# Patient Record
Sex: Male | Born: 1960
Health system: Southern US, Community
[De-identification: ages and names within clinical notes are randomized; demographics above are authoritative.]

## PROBLEM LIST (undated history)

## (undated) ENCOUNTER — Emergency Department (HOSPITAL_COMMUNITY): Discharge status: Absent without leave | Payer: No Typology Code available for payment source

## (undated) DIAGNOSIS — H9190 Unspecified hearing loss, unspecified ear: Secondary | ICD-10-CM

## (undated) DIAGNOSIS — R5383 Other fatigue: Secondary | ICD-10-CM

## (undated) DIAGNOSIS — K0889 Other specified disorders of teeth and supporting structures: Secondary | ICD-10-CM

## (undated) DIAGNOSIS — K219 Gastro-esophageal reflux disease without esophagitis: Secondary | ICD-10-CM

## (undated) DIAGNOSIS — T7840XA Allergy, unspecified, initial encounter: Secondary | ICD-10-CM

## (undated) DIAGNOSIS — F0390 Unspecified dementia without behavioral disturbance: Secondary | ICD-10-CM

## (undated) DIAGNOSIS — H9319 Tinnitus, unspecified ear: Secondary | ICD-10-CM

## (undated) DIAGNOSIS — R1310 Dysphagia, unspecified: Secondary | ICD-10-CM

## (undated) DIAGNOSIS — Q676 Pectus excavatum: Secondary | ICD-10-CM

## (undated) DIAGNOSIS — R0602 Shortness of breath: Secondary | ICD-10-CM

## (undated) DIAGNOSIS — T884XXA Failed or difficult intubation, initial encounter: Secondary | ICD-10-CM

## (undated) DIAGNOSIS — M549 Dorsalgia, unspecified: Secondary | ICD-10-CM

## (undated) DIAGNOSIS — M4050 Lordosis, unspecified, site unspecified: Secondary | ICD-10-CM

## (undated) DIAGNOSIS — R413 Other amnesia: Secondary | ICD-10-CM

## (undated) DIAGNOSIS — M6281 Muscle weakness (generalized): Secondary | ICD-10-CM

## (undated) HISTORY — DX: Allergy, unspecified, initial encounter: T78.40XA

## (undated) HISTORY — PX: CERVICAL FUSION: SHX112

## (undated) HISTORY — DX: Dysphagia, unspecified: R13.10

## (undated) HISTORY — DX: Other specified disorders of teeth and supporting structures: K08.89

## (undated) HISTORY — DX: Other fatigue: R53.83

## (undated) HISTORY — DX: Tinnitus, unspecified ear: H93.19

## (undated) HISTORY — DX: Muscle weakness (generalized): M62.81

## (undated) HISTORY — PX: OTHER SURGICAL HISTORY: SHX169

## (undated) HISTORY — PX: BACK SURGERY: SHX140

## (undated) HISTORY — DX: Unspecified hearing loss, unspecified ear: H91.90

## (undated) HISTORY — DX: Shortness of breath: R06.02

## (undated) HISTORY — DX: Unspecified dementia, unspecified severity, without behavioral disturbance, psychotic disturbance, mood disturbance, and anxiety: F03.90

## (undated) HISTORY — DX: Other amnesia: R41.3

## (undated) HISTORY — DX: Dorsalgia, unspecified: M54.9

## (undated) HISTORY — DX: Lordosis, unspecified, site unspecified: M40.50

---
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
## 1984-11-23 HISTORY — PX: INGUINAL HERNIA REPAIR: SUR1180

## 1997-11-23 HISTORY — PX: ANTERIOR CERVICAL DISCECTOMY: SHX1160

## 2006-12-29 ENCOUNTER — Inpatient Hospital Stay (HOSPITAL_COMMUNITY): Admission: EM | Admit: 2006-12-29 | Discharge: 2006-12-30 | Payer: Self-pay | Admitting: Emergency Medicine

## 2008-11-23 HISTORY — PX: OTHER SURGICAL HISTORY: SHX169

## 2009-03-26 ENCOUNTER — Ambulatory Visit (HOSPITAL_COMMUNITY): Admission: RE | Admit: 2009-03-26 | Discharge: 2009-03-26 | Payer: Self-pay | Admitting: Family Medicine

## 2010-11-23 HISTORY — PX: TRANSURETHRAL RESECTION OF PROSTATE: SHX73

## 2010-12-23 ENCOUNTER — Ambulatory Visit (HOSPITAL_COMMUNITY)
Admission: RE | Admit: 2010-12-23 | Discharge: 2010-12-23 | Payer: Self-pay | Source: Home / Self Care | Attending: Family Medicine | Admitting: Family Medicine

## 2011-04-10 NOTE — H&P (Signed)
[name], [name]                    ACCOUNT NO.:  [id number]   MEDICAL RECORD NO.:  [id number]           PATIENT TYPE:  INP   LOCATION:  A336                          FACILITY:  [facility]   PHYSICIAN:  [name], M.D.DATE OF BIRTH:  1961/11/20   DATE OF ADMISSION:  DATE OF DISCHARGE:  LH                              HISTORY & PHYSICAL   CHIEF COMPLAINT:  Abdominal pain.   HISTORY OF PRESENT ILLNESS:  This is a 50 year old male who underwent  cervical disk surgery at [facility] recently. The patient was  discharged the day before admission and presents to the emergency room  unable to pass gas, abdominal distention, and pain. X-ray consistent  with a dynamic ileus/small bowel obstruction. The patient afebrile. Has  a white count 24,000. __________  . Electrolytes were normal. The  patient will be admitted, strict NPO, NG tubes, surgical consult.   ALLERGIES:  The patient is allergic to MORPHINE.   MEDICATIONS:  Apparently the patient has been taking __________  ,  Flexeril, oxycodone, and 4 mg Medrol Pak.   SOCIAL HISTORY:  The patient is a nonsmoker, nondrinker, nonalcohol  abuser. Works for the [employer].   PHYSICAL EXAMINATION:  VITAL SIGNS:  Patient is afebrile, blood pressure  150/90, O2 sats 97% on room air, pulse 90 and regular.  HEENT:  TMs normal, pupils are equal and react accommodation, oropharynx  benign.  NECK:  Supple without JVD, bruit or thyromegaly.  LUNGS:  Clear to auscultation.  ABDOMEN:  Pronounced distention, tympanitic, silent bowel sounds. Mild  diffuse tenderness.  EXTREMITIES:  Without clubbing, cyanosis or edema.  NEUROLOGICAL:  Grossly intact.   ASSESSMENT:  1. Small bowel obstruction.  2. Status post cervical disk surgery.      [name], M.D.  Electronically Signed     WMM/MEDQ  D:  12/30/2006  T:  12/30/2006  Job:  [id number]

## 2011-04-10 NOTE — Discharge Summary (Signed)
[name], [name]                    ACCOUNT NO.:  [id number]   MEDICAL RECORD NO.:  [id number]          PATIENT TYPE:  INP   LOCATION:  A336                          FACILITY:  [facility]   PHYSICIAN:  Dalia Heading, M.D.  DATE OF BIRTH:  12/18/1960   DATE OF ADMISSION:  12/29/2006  DATE OF DISCHARGE:  02/07/2008LH                               DISCHARGE SUMMARY   HOSPITAL COURSE SUMMARY:  The patient is a 50 year old white male who on  December 28, 2006 underwent a C4-C5 anterior cervical disk surgery by Dr.  Rise Mu of [facility] who presented to the emergency  room yesterday evening after discharge from [facility] with worsening  abdominal distention, nausea and vomiting.  He presented to the  emergency room at [facility] and was admitted by Dr. Regino Schultze,  family practice, for further evaluation and treatment.  Surgery  consultation was obtained.  The patient was noted to have significant  abdominal distention with occasional bowel sounds.  The patient states  he has not had a significant bowel movement in many days.  A CT scan of  the abdomen and pelvis was performed which revealed a high-grade small  bowel obstruction in the mid to distal aspect of the small bowel.  There  is small bowel wall thickening at the area of the stricture as well as  free fluid and collapsed bowel distally.  There is a strong indication  that this patient requires an exploratory laparotomy.  The etiology of  this stricture and bowel obstruction is unknown as the patient has not  had previous abdominal surgery.  In discussing this with Dr. Jayme Cloud of  the anesthesia department at [facility], he did not feel  comfortable performing general endotracheal intubation due to the  patient's very recent surgery.  He states he did not have the backup to  provide further anesthesia care should difficulties arise at the time of  intubation.  This was discussed with Maralyn Sago who works for Dr.  Rise Mu and  she has agreed, along with Dr. Rise Mu to accept the patient in transfer  back to [facility] for further evaluation and  treatment.  This has been discussed with the patient and family who  agreed to the transfer.  Dr. Karleen Hampshire has been notified and  agrees with the transfer also.   CONDITION ON TRANSFER:  Stable.   DISCHARGE MEDICATIONS:  Dilaudid and Zofran.   DISCHARGE DIAGNOSES:  1. Small bowel obstruction, unknown etiology.  2. Status post recent neck surgery.      Dalia Heading, M.D.  Electronically Signed    MAJ/MEDQ  D:  12/30/2006  T:  12/30/2006  Job:  [id number]   cc:   Kirk Ruths, M.D.  Fax: [phone]

## 2011-07-21 ENCOUNTER — Ambulatory Visit (AMBULATORY_SURGERY_CENTER): Payer: PRIVATE HEALTH INSURANCE | Admitting: *Deleted

## 2011-07-21 ENCOUNTER — Encounter: Payer: Self-pay | Admitting: Gastroenterology

## 2011-07-21 VITALS — Ht 69.5 in | Wt 187.8 lb

## 2011-07-21 DIAGNOSIS — Z1211 Encounter for screening for malignant neoplasm of colon: Secondary | ICD-10-CM

## 2011-07-21 MED ORDER — SUPREP BOWEL PREP KIT 17.5-3.13-1.6 GM/177ML PO SOLN: 1.0000 | Freq: Once | ORAL | Status: DC

## 2011-08-07 ENCOUNTER — Ambulatory Visit (AMBULATORY_SURGERY_CENTER): Payer: PRIVATE HEALTH INSURANCE | Admitting: Gastroenterology

## 2011-08-07 ENCOUNTER — Encounter: Payer: Self-pay | Admitting: Gastroenterology

## 2011-08-07 VITALS — BP 136/79 | HR 73 | Temp 97.0°F | Resp 20 | Ht 69.0 in | Wt 187.0 lb

## 2011-08-07 DIAGNOSIS — D126 Benign neoplasm of colon, unspecified: Secondary | ICD-10-CM

## 2011-08-07 DIAGNOSIS — Z8371 Family history of colonic polyps: Secondary | ICD-10-CM

## 2011-08-07 DIAGNOSIS — Z1211 Encounter for screening for malignant neoplasm of colon: Secondary | ICD-10-CM

## 2011-08-07 MED ORDER — SODIUM CHLORIDE 0.9 % IV SOLN: 500.0000 mL | INTRAVENOUS | Status: DC

## 2011-08-07 NOTE — Patient Instructions (Signed)
Follow your discharge instructions.  Continue your medications.  Await pathology results. 

## 2011-08-12 ENCOUNTER — Encounter: Payer: Self-pay | Admitting: Gastroenterology

## 2014-05-02 ENCOUNTER — Other Ambulatory Visit: Payer: Self-pay | Admitting: Orthopedic Surgery

## 2014-05-02 DIAGNOSIS — M25532 Pain in left wrist: Secondary | ICD-10-CM

## 2014-05-17 ENCOUNTER — Other Ambulatory Visit: Payer: PRIVATE HEALTH INSURANCE

## 2014-06-08 ENCOUNTER — Ambulatory Visit
Admission: RE | Admit: 2014-06-08 | Discharge: 2014-06-08 | Disposition: A | Discharge status: Home / Self Care | Payer: PRIVATE HEALTH INSURANCE | Source: Ambulatory Visit | Attending: Orthopedic Surgery | Admitting: Orthopedic Surgery

## 2014-06-08 ENCOUNTER — Encounter: Payer: Self-pay | Admitting: Gastroenterology

## 2014-06-08 DIAGNOSIS — M25532 Pain in left wrist: Secondary | ICD-10-CM

## 2014-06-26 ENCOUNTER — Encounter: Payer: Self-pay | Admitting: Gastroenterology

## 2014-08-17 ENCOUNTER — Ambulatory Visit (AMBULATORY_SURGERY_CENTER): Payer: Self-pay

## 2014-08-17 VITALS — Ht 69.75 in | Wt 194.6 lb

## 2014-08-17 DIAGNOSIS — Z860100 Personal history of colon polyps, unspecified: Secondary | ICD-10-CM

## 2014-08-17 MED ORDER — SUPREP BOWEL PREP KIT 17.5-3.13-1.6 GM/177ML PO SOLN: 1.0000 | Freq: Once | ORAL | Status: DC

## 2014-08-17 NOTE — Progress Notes (Signed)
No allergies to eggs or soy No home oxygen No past problems with anesthesia No diet/weight loss meds  Has email.  Emmi instructions given for colonoscopy. 

## 2014-08-23 HISTORY — PX: COLONOSCOPY: SHX174

## 2014-09-10 ENCOUNTER — Telehealth: Payer: Self-pay | Admitting: Gastroenterology

## 2014-09-10 DIAGNOSIS — Z86010 Personal history of colonic polyps: Secondary | ICD-10-CM

## 2014-09-10 MED ORDER — NA SULFATE-K SULFATE-MG SULF 17.5-3.13-1.6 GM/177ML PO SOLN: 1.0000 | Freq: Once | ORAL | Status: DC

## 2014-09-10 NOTE — Telephone Encounter (Signed)
Resent Suprep to patient's pharmacy today. Left message for patient to notify him of this.

## 2014-09-14 ENCOUNTER — Encounter: Payer: Self-pay | Admitting: Gastroenterology

## 2014-09-14 ENCOUNTER — Ambulatory Visit (AMBULATORY_SURGERY_CENTER): Payer: PRIVATE HEALTH INSURANCE | Admitting: Gastroenterology

## 2014-09-14 VITALS — BP 142/79 | HR 80 | Temp 97.1°F | Resp 24 | Ht 69.75 in | Wt 194.0 lb

## 2014-09-14 DIAGNOSIS — D120 Benign neoplasm of cecum: Secondary | ICD-10-CM

## 2014-09-14 DIAGNOSIS — Z86010 Personal history of colonic polyps: Secondary | ICD-10-CM

## 2014-09-14 DIAGNOSIS — D125 Benign neoplasm of sigmoid colon: Secondary | ICD-10-CM

## 2014-09-14 MED ORDER — SODIUM CHLORIDE 0.9 % IV SOLN: 500.0000 mL | INTRAVENOUS | Status: DC

## 2014-09-14 NOTE — Progress Notes (Signed)
Called to room to assist during endoscopic procedure.  Patient ID and intended procedure confirmed with present staff. Received instructions for my participation in the procedure from the performing physician.  

## 2014-09-14 NOTE — Patient Instructions (Signed)
YOU HAD AN ENDOSCOPIC PROCEDURE TODAY AT THE  ENDOSCOPY CENTER: Refer to the procedure report that was given to you for any specific questions about what was found during the examination.  If the procedure report does not answer your questions, please call your gastroenterologist to clarify.  If you requested that your care partner not be given the details of your procedure findings, then the procedure report has been included in a sealed envelope for you to review at your convenience later.  YOU SHOULD EXPECT: Some feelings of bloating in the abdomen. Passage of more gas than usual.  Walking can help get rid of the air that was put into your GI tract during the procedure and reduce the bloating. If you had a lower endoscopy (such as a colonoscopy or flexible sigmoidoscopy) you may notice spotting of blood in your stool or on the toilet paper. If you underwent a bowel prep for your procedure, then you may not have a normal bowel movement for a few days.  DIET: Your first meal following the procedure should be a light meal and then it is ok to progress to your normal diet.  A half-sandwich or bowl of soup is an example of a good first meal.  Heavy or fried foods are harder to digest and may make you feel nauseous or bloated.  Likewise meals heavy in dairy and vegetables can cause extra gas to form and this can also increase the bloating.  Drink plenty of fluids but you should avoid alcoholic beverages for 24 hours.  ACTIVITY: Your care partner should take you home directly after the procedure.  You should plan to take it easy, moving slowly for the rest of the day.  You can resume normal activity the day after the procedure however you should NOT DRIVE or use heavy machinery for 24 hours (because of the sedation medicines used during the test).    SYMPTOMS TO REPORT IMMEDIATELY: A gastroenterologist can be reached at any hour.  During normal business hours, 8:30 AM to 5:00 PM Monday through Friday,  call [phone].  After hours and on weekends, please call the GI answering service at [phone] who will take a message and have the physician on call contact you.   Following lower endoscopy (colonoscopy or flexible sigmoidoscopy):  Excessive amounts of blood in the stool  Significant tenderness or worsening of abdominal pains  Swelling of the abdomen that is new, acute  Fever of 100F or higher  FOLLOW UP: If any biopsies were taken you will be contacted by phone or by letter within the next 1-3 weeks.  Call your gastroenterologist if you have not heard about the biopsies in 3 weeks.  Our staff will call the home number listed on your records the next business day following your procedure to check on you and address any questions or concerns that you may have at that time regarding the information given to you following your procedure. This is a courtesy call and so if there is no answer at the home number and we have not heard from you through the emergency physician on call, we will assume that you have returned to your regular daily activities without incident.  SIGNATURES/CONFIDENTIALITY: You and/or your care partner have signed paperwork which will be entered into your electronic medical record.  These signatures attest to the fact that that the information above on your After Visit Summary has been reviewed and is understood.  Full responsibility of the confidentiality of this   discharge information lies with you and/or your care-partner.  Recommendations Next colonoscopy determined by pathology results, 3 years or 5 years. Advised patient/care partner no aspirin, aspirin products, or anti-inflammatory medications for two weeks. Polyp(s) and Diverticulosis handouts provided to patient/care partner.

## 2014-09-14 NOTE — Progress Notes (Signed)
Report to PACU, RN, vss, BBS= Clear.  

## 2014-09-14 NOTE — Op Note (Signed)
[facility]  [address] [place], [id number]   COLONOSCOPY PROCEDURE REPORT PATIENT: [name], [name]  MR#: [id number] BIRTHDATE: 1961/02/01 , 62  yrs. old GENDER: male ENDOSCOPIST: Ladene Artist, MD, [facility] PROCEDURE DATE:  09/14/2014 PROCEDURE:   Colonoscopy with biopsy and Colonoscopy with snare polypectomy First Screening Colonoscopy - Avg.  risk and is 50 yrs.  old or older - No.  Prior Negative Screening - Now for repeat screening. N/A  History of Adenoma - Now for follow-up colonoscopy & has been > or = to 3 yrs.  Yes hx of adenoma.  Has been 3 or more years since last colonoscopy.  Polyps Removed Today? Yes. ASA CLASS:   Class II INDICATIONS:surveillance colonoscopy based on a history of adenomatous colonic polyp(s). MEDICATIONS: Monitored anesthesia care and Propofol 300 mg IV DESCRIPTION OF PROCEDURE:   After the risks benefits and alternatives of the procedure were thoroughly explained, informed consent was obtained.  The digital rectal exam revealed no abnormalities of the rectum.   The [id number]  endoscope was introduced through the anus and advanced to the cecum, which was identified by both the appendix and ileocecal valve. No adverse events experienced.   The quality of the prep was good, using MoviPrep  The instrument was then slowly withdrawn as the colon was fully examined.  COLON FINDINGS: A sessile polyp measuring 8 mm in size was found at the cecum.  A polypectomy was performed using snare cautery.  The resection was complete, the polyp tissue was completely retrieved and sent to histology.   Two sessile polyps measuring 5 mm in size were found in the sigmoid colon.  A polypectomy was performed using snare cautery.  The resection was complete, the polyp tissue was completely retrieved and sent to histology.   Four sessile polyps measuring 4 mm in size were found in the sigmoid colon.  A polypectomy was performed with cold forceps.   The resection was complete, the polyp tissue was completely retrieved and sent to histology.   The examination was otherwise normal.  Retroflexed views revealed internal Grade I hemorrhoids. The time to cecum=2 minutes 17 seconds.  Withdrawal time=14 minutes 38 seconds.  The scope was withdrawn and the procedure completed. COMPLICATIONS: There were no immediate complications.  ENDOSCOPIC IMPRESSION: 1.   Sessile polyp at the cecum; polypectomy performed using snare cautery 2.   Two sessile polyps in the sigmoid colon; polypectomy performed using snare cautery 3.   Four sessile polyps in the sigmoid colon; polypectomy performed with cold forceps 4.   Grade I internal hemorrhoids  RECOMMENDATIONS: 1.  Await pathology results 2.  Hold Aspirin and all other NSAIDS for 2 weeks. 3.  Repeat colonoscopy in 3 years if 3 or more polyps are adenomatous; otherwise 5 years  eSigned:  Ladene Artist, MD, [facility] 09/14/2014 10:36 AM   cc: Elsie Lincoln, MD

## 2014-09-17 ENCOUNTER — Telehealth: Payer: Self-pay | Admitting: *Deleted

## 2014-09-17 NOTE — Telephone Encounter (Signed)
No answer. Number identifier. Message left to call if any questions or concerns. 

## 2014-09-18 ENCOUNTER — Encounter: Payer: Self-pay | Admitting: Gastroenterology

## 2014-09-18 ENCOUNTER — Other Ambulatory Visit: Payer: Self-pay | Admitting: Orthopedic Surgery

## 2014-10-03 ENCOUNTER — Encounter (HOSPITAL_BASED_OUTPATIENT_CLINIC_OR_DEPARTMENT_OTHER): Payer: Self-pay | Admitting: *Deleted

## 2014-10-08 ENCOUNTER — Encounter (HOSPITAL_BASED_OUTPATIENT_CLINIC_OR_DEPARTMENT_OTHER)
Admission: RE | Admit: 2014-10-08 | Discharge: 2014-10-08 | Disposition: A | Discharge status: Home / Self Care | Payer: PRIVATE HEALTH INSURANCE | Source: Ambulatory Visit | Attending: Orthopedic Surgery | Admitting: Orthopedic Surgery

## 2014-10-08 LAB — BASIC METABOLIC PANEL
ANION GAP: 14 (ref 5–15)
BUN: 15 mg/dL (ref 6–23)
CHLORIDE: 92 meq/L — AB (ref 96–112)
CO2: 26 mEq/L (ref 19–32)
Calcium: 9.8 mg/dL (ref 8.4–10.5)
Creatinine, Ser: 0.81 mg/dL (ref 0.50–1.35)
GFR calc non Af Amer: >90 mL/min (ref >90)
Glucose, Bld: 155 mg/dL — ABNORMAL HIGH (ref 70–99)
POTASSIUM: 4.2 meq/L (ref 3.7–5.3)
SODIUM: 132 meq/L — AB (ref 137–147)

## 2014-10-09 ENCOUNTER — Ambulatory Visit (HOSPITAL_BASED_OUTPATIENT_CLINIC_OR_DEPARTMENT_OTHER)
Admission: RE | Admit: 2014-10-09 | Discharge: 2014-10-09 | Disposition: A | Discharge status: Home / Self Care | Payer: PRIVATE HEALTH INSURANCE | Source: Ambulatory Visit | Attending: Orthopedic Surgery | Admitting: Orthopedic Surgery

## 2014-10-09 ENCOUNTER — Encounter (HOSPITAL_BASED_OUTPATIENT_CLINIC_OR_DEPARTMENT_OTHER): Payer: Self-pay | Admitting: Orthopedic Surgery

## 2014-10-09 ENCOUNTER — Ambulatory Visit (HOSPITAL_BASED_OUTPATIENT_CLINIC_OR_DEPARTMENT_OTHER): Payer: PRIVATE HEALTH INSURANCE | Admitting: Anesthesiology

## 2014-10-09 ENCOUNTER — Encounter (HOSPITAL_BASED_OUTPATIENT_CLINIC_OR_DEPARTMENT_OTHER)
Admission: RE | Disposition: A | Discharge status: Home / Self Care | Payer: Self-pay | Source: Ambulatory Visit | Attending: Orthopedic Surgery

## 2014-10-09 DIAGNOSIS — Z888 Allergy status to other drugs, medicaments and biological substances status: Secondary | ICD-10-CM | POA: Insufficient documentation

## 2014-10-09 DIAGNOSIS — M25831 Other specified joint disorders, right wrist: Secondary | ICD-10-CM | POA: Insufficient documentation

## 2014-10-09 DIAGNOSIS — M13832 Other specified arthritis, left wrist: Secondary | ICD-10-CM | POA: Diagnosis not present

## 2014-10-09 HISTORY — PX: WRIST ARTHROSCOPY: SHX838

## 2014-10-09 HISTORY — PX: ULNA OSTEOTOMY: SHX1077

## 2014-10-09 LAB — POCT HEMOGLOBIN-HEMACUE: Hemoglobin: 16.1 g/dL (ref 13.0–17.0)

## 2014-10-09 SURGERY — ARTHROSCOPY, WRIST
Anesthesia: General | Site: Wrist | Laterality: Right

## 2014-10-09 MED ORDER — CEFAZOLIN SODIUM-DEXTROSE 2-3 GM-% IV SOLR
2.0000 g | INTRAVENOUS | Status: AC
  Administered 2014-10-09: 2 g via INTRAVENOUS

## 2014-10-09 MED ORDER — FENTANYL CITRATE 0.05 MG/ML IJ SOLN
50.0000 ug | INTRAMUSCULAR | Status: DC | PRN
  Administered 2014-10-09: 50 ug via INTRAVENOUS

## 2014-10-09 MED ORDER — BUPIVACAINE-EPINEPHRINE (PF) 0.5% -1:200000 IJ SOLN
INTRAMUSCULAR | Status: DC | PRN
  Administered 2014-10-09: 20 mL via PERINEURAL

## 2014-10-09 MED ORDER — DEXAMETHASONE SODIUM PHOSPHATE 10 MG/ML IJ SOLN
INTRAMUSCULAR | Status: DC | PRN
  Administered 2014-10-09: 10 mg via INTRAVENOUS

## 2014-10-09 MED ORDER — LIDOCAINE-EPINEPHRINE (PF) 1.5 %-1:200000 IJ SOLN
INTRAMUSCULAR | Status: DC | PRN
  Administered 2014-10-09: 20 mL via PERINEURAL

## 2014-10-09 MED ORDER — OXYCODONE HCL 5 MG/5ML PO SOLN: 5.0000 mg | Freq: Once | ORAL | Status: DC | PRN

## 2014-10-09 MED ORDER — HYDROMORPHONE HCL 1 MG/ML IJ SOLN: 0.2500 mg | INTRAMUSCULAR | Status: DC | PRN

## 2014-10-09 MED ORDER — OXYCODONE HCL 5 MG PO TABS: 5.0000 mg | ORAL_TABLET | Freq: Once | ORAL | Status: DC | PRN

## 2014-10-09 MED ORDER — CEFAZOLIN SODIUM-DEXTROSE 2-3 GM-% IV SOLR: 2.0000 g | INTRAVENOUS | Status: DC

## 2014-10-09 MED ORDER — FENTANYL CITRATE 0.05 MG/ML IJ SOLN
INTRAMUSCULAR | Status: AC
  Filled 2014-10-09: qty 2

## 2014-10-09 MED ORDER — CEFAZOLIN SODIUM-DEXTROSE 2-3 GM-% IV SOLR
INTRAVENOUS | Status: AC
  Filled 2014-10-09: qty 50

## 2014-10-09 MED ORDER — LACTATED RINGERS IV SOLN
INTRAVENOUS | Status: DC
  Administered 2014-10-09: 10:00:00 via INTRAVENOUS

## 2014-10-09 MED ORDER — CHLORHEXIDINE GLUCONATE 4 % EX LIQD: 60.0000 mL | Freq: Once | CUTANEOUS | Status: DC

## 2014-10-09 MED ORDER — MIDAZOLAM HCL 2 MG/2ML IJ SOLN
INTRAMUSCULAR | Status: AC
  Filled 2014-10-09: qty 2

## 2014-10-09 MED ORDER — PROPOFOL 10 MG/ML IV EMUL
INTRAVENOUS | Status: AC
Start: 2014-10-09 — End: 2014-10-09
  Filled 2014-10-09: qty 50

## 2014-10-09 MED ORDER — MEPERIDINE HCL 25 MG/ML IJ SOLN: 6.2500 mg | INTRAMUSCULAR | Status: DC | PRN

## 2014-10-09 MED ORDER — PROPOFOL 10 MG/ML IV BOLUS
INTRAVENOUS | Status: DC | PRN
  Administered 2014-10-09: 100 mg via INTRAVENOUS
  Administered 2014-10-09: 200 mg via INTRAVENOUS

## 2014-10-09 MED ORDER — MIDAZOLAM HCL 2 MG/2ML IJ SOLN
1.0000 mg | INTRAMUSCULAR | Status: DC | PRN
  Administered 2014-10-09: 1 mg via INTRAVENOUS

## 2014-10-09 MED ORDER — MIDAZOLAM HCL 2 MG/2ML IJ SOLN: 0.5000 mg | Freq: Once | INTRAMUSCULAR | Status: DC | PRN

## 2014-10-09 MED ORDER — OXYCODONE-ACETAMINOPHEN 10-325 MG PO TABS: 1.0000 | ORAL_TABLET | ORAL | Status: DC | PRN

## 2014-10-09 MED ORDER — SODIUM CHLORIDE 0.9 % IV SOLN
INTRAVENOUS | Status: DC | PRN
  Administered 2014-10-09: 800 mL

## 2014-10-09 MED ORDER — ONDANSETRON HCL 4 MG/2ML IJ SOLN
INTRAMUSCULAR | Status: DC | PRN
  Administered 2014-10-09: 4 mg via INTRAVENOUS

## 2014-10-09 MED ORDER — PROMETHAZINE HCL 25 MG/ML IJ SOLN: 6.2500 mg | INTRAMUSCULAR | Status: DC | PRN

## 2014-10-09 SURGICAL SUPPLY — 104 items
BAG DECANTER FOR FLEXI CONT (MISCELLANEOUS) ×1 IMPLANT
BIT DRILL 2.8X5 QR DISP (BIT) ×1 IMPLANT
BIT DRILL QUICK RELEASE 3.5MM (BIT) IMPLANT
BLADE AVERAGE 25X9 (BLADE) ×2 IMPLANT
BLADE CUDA 2.0 (BLADE) IMPLANT
BLADE EAR TYMPAN 2.5 60D BEAV (BLADE) ×1 IMPLANT
BLADE MINI RND TIP GREEN BEAV (BLADE) IMPLANT
BLADE SAW OSTEOTOMY (BLADE) ×1 IMPLANT
BLADE SURG 15 STRL LF DISP TIS (BLADE) ×1 IMPLANT
BLADE SURG 15 STRL SS (BLADE) ×2
BNDG CMPR 9X4 STRL LF SNTH (GAUZE/BANDAGES/DRESSINGS) ×1
BNDG COHESIVE 3X5 TAN STRL LF (GAUZE/BANDAGES/DRESSINGS) ×4 IMPLANT
BNDG ESMARK 4X9 LF (GAUZE/BANDAGES/DRESSINGS) ×2 IMPLANT
BNDG GAUZE ELAST 4 BULKY (GAUZE/BANDAGES/DRESSINGS) ×2 IMPLANT
BONE EVOLUTION TRINITY 1CC (Bone Implant) ×1 IMPLANT
BUR CUDA 2.9 (BURR) IMPLANT
BUR EGG 3PK/BX (BURR) IMPLANT
BUR FULL RADIUS 2.0 (BURR) IMPLANT
BUR FULL RADIUS 2.9 (BURR) IMPLANT
BUR GATOR 2.9 (BURR) ×1 IMPLANT
BUR SPHERICAL 2.9 (BURR) IMPLANT
CANISTER SUCT 1200ML W/VALVE (MISCELLANEOUS) ×1 IMPLANT
CANISTER SUCT 3000ML (MISCELLANEOUS) IMPLANT
CHLORAPREP W/TINT 26ML (MISCELLANEOUS) ×2 IMPLANT
CORDS BIPOLAR (ELECTRODE) ×2 IMPLANT
COVER BACK TABLE 60X90IN (DRAPES) ×2 IMPLANT
COVER MAYO STAND STRL (DRAPES) ×2 IMPLANT
CUFF TOURNIQUET SINGLE 18IN (TOURNIQUET CUFF) ×1 IMPLANT
DECANTER SPIKE VIAL GLASS SM (MISCELLANEOUS) IMPLANT
DRAIN PENROSE 1/2X12 LTX STRL (WOUND CARE) IMPLANT
DRAPE EXTREMITY T 121X128X90 (DRAPE) ×2 IMPLANT
DRAPE OEC MINIVIEW 54X84 (DRAPES) ×2 IMPLANT
DRAPE SURG 17X23 STRL (DRAPES) ×2 IMPLANT
DRAPE U 20/CS (DRAPES) ×2 IMPLANT
DRILL QUICK RELEASE 3.5MM (BIT) ×2
DRSG KUZMA FLUFF (GAUZE/BANDAGES/DRESSINGS) IMPLANT
ELECT SMALL JOINT 90D BASC (ELECTRODE) IMPLANT
GAUZE SPONGE 4X4 12PLY STRL (GAUZE/BANDAGES/DRESSINGS) ×2 IMPLANT
GAUZE SPONGE 4X4 16PLY XRAY LF (GAUZE/BANDAGES/DRESSINGS) ×2 IMPLANT
GAUZE XEROFORM 1X8 LF (GAUZE/BANDAGES/DRESSINGS) ×2 IMPLANT
GLOVE BIOGEL PI IND STRL 7.0 (GLOVE) IMPLANT
GLOVE BIOGEL PI IND STRL 8.5 (GLOVE) ×1 IMPLANT
GLOVE BIOGEL PI INDICATOR 7.0 (GLOVE) ×2
GLOVE BIOGEL PI INDICATOR 8.5 (GLOVE) ×1
GLOVE ECLIPSE 6.5 STRL STRAW (GLOVE) ×1 IMPLANT
GLOVE SURG ORTHO 8.0 STRL STRW (GLOVE) ×2 IMPLANT
GOWN STRL REUS W/ TWL LRG LVL3 (GOWN DISPOSABLE) ×1 IMPLANT
GOWN STRL REUS W/TWL LRG LVL3 (GOWN DISPOSABLE) ×2
GOWN STRL REUS W/TWL XL LVL3 (GOWN DISPOSABLE) ×3 IMPLANT
GUIDEWIRE ORTHO 0.054X6 (WIRE) ×2 IMPLANT
IV NS IRRIG 3000ML ARTHROMATIC (IV SOLUTION) ×1 IMPLANT
KIT MINI BIO ANCHOR DRILL (KITS) IMPLANT
MANIFOLD NEPTUNE II (INSTRUMENTS) IMPLANT
NDL EPIDURAL TUOHY 20GX3.5 (NEEDLE) IMPLANT
NDL SAFETY ECLIPSE 18X1.5 (NEEDLE) ×3 IMPLANT
NDL SPNL 18GX3.5 QUINCKE PK (NEEDLE) IMPLANT
NEEDLE HYPO 18GX1.5 SHARP (NEEDLE) ×2
NEEDLE HYPO 22GX1.5 SAFETY (NEEDLE) ×2 IMPLANT
NEEDLE SPNL 18GX3.5 QUINCKE PK (NEEDLE) IMPLANT
NEEDLE TUOHY 20GX3.5 (NEEDLE) IMPLANT
NS IRRIG 1000ML POUR BTL (IV SOLUTION) ×2 IMPLANT
PACK BASIN DAY SURGERY FS (CUSTOM PROCEDURE TRAY) ×2 IMPLANT
PAD CAST 3X4 CTTN HI CHSV (CAST SUPPLIES) ×1 IMPLANT
PAD CAST 4YDX4 CTTN HI CHSV (CAST SUPPLIES) ×1 IMPLANT
PADDING CAST ABS 3INX4YD NS (CAST SUPPLIES)
PADDING CAST ABS 4INX4YD NS (CAST SUPPLIES) ×1
PADDING CAST ABS COTTON 3X4 (CAST SUPPLIES) ×1 IMPLANT
PADDING CAST ABS COTTON 4X4 ST (CAST SUPPLIES) ×1 IMPLANT
PADDING CAST COTTON 3X4 STRL (CAST SUPPLIES) ×2
PADDING CAST COTTON 4X4 STRL (CAST SUPPLIES)
PLATE ULNAR SHORTENING (Plate) ×1 IMPLANT
ROUTER HOODED VORTEX 2.9MM (BLADE) IMPLANT
SCREW NLCKG 13 3.5X13 HEXA (Screw) IMPLANT
SCREW NON LOCKING HEX 3.5X18MM (Screw) ×1 IMPLANT
SCREW NON-LOCK 3.5X13 (Screw) ×10 IMPLANT
SCREW NONLOCK HEX 3.5X12 (Screw) ×1 IMPLANT
SET ARTHROSCOPY TUBING (MISCELLANEOUS) ×2
SET ARTHROSCOPY TUBING LN (MISCELLANEOUS) IMPLANT
SET EXT MALE ROTATING LL 32IN (MISCELLANEOUS) ×2 IMPLANT
SET IV EXT TUBING FEMALE 31 (MISCELLANEOUS) ×1 IMPLANT
SET SM JOINT TUBING/CANN (CANNULA) IMPLANT
SLEEVE SCD COMPRESS KNEE MED (MISCELLANEOUS) ×1 IMPLANT
SLING ARM FOAM STRAP LRG (SOFTGOODS) ×1 IMPLANT
SLING ARM LRG ADULT FOAM STRAP (SOFTGOODS) ×1 IMPLANT
SLING ARM MED ADULT FOAM STRAP (SOFTGOODS) IMPLANT
SPLINT PLASTER CAST XFAST 3X15 (CAST SUPPLIES) IMPLANT
SPLINT PLASTER XTRA FASTSET 3X (CAST SUPPLIES) ×30
STOCKINETTE 4X48 STRL (DRAPES) ×2 IMPLANT
SUCTION FRAZIER TIP 10 FR DISP (SUCTIONS) IMPLANT
SUT MERSILENE 4 0 P 3 (SUTURE) IMPLANT
SUT PDS AB 2-0 CT2 27 (SUTURE) IMPLANT
SUT STEEL 4 0 (SUTURE) IMPLANT
SUT VIC AB 2-0 PS2 27 (SUTURE) ×2 IMPLANT
SUT VICRYL 4-0 PS2 18IN ABS (SUTURE) ×2 IMPLANT
SUT VICRYL RAPID 5 0 P 3 (SUTURE) IMPLANT
SUT VICRYL RAPIDE 4/0 PS 2 (SUTURE) ×2 IMPLANT
SYR BULB 3OZ (MISCELLANEOUS) ×2 IMPLANT
SYR CONTROL 10ML LL (SYRINGE) ×2 IMPLANT
TOWEL OR 17X24 6PK STRL BLUE (TOWEL DISPOSABLE) ×3 IMPLANT
TUBE CONNECTING 20X1/4 (TUBING) IMPLANT
UNDERPAD 30X30 INCONTINENT (UNDERPADS AND DIAPERS) ×1 IMPLANT
WAND 1.5 MICROBLATOR (SURGICAL WAND) ×1 IMPLANT
WATER STERILE IRR 1000ML POUR (IV SOLUTION) ×2 IMPLANT
WIRE TACK PLATE PL-PTACK (WIRE) ×1 IMPLANT

## 2014-10-09 NOTE — Transfer of Care (Signed)
Immediate Anesthesia Transfer of Care Note  Patient: [name]  Procedure(s) Performed: Procedure(s): RIGHT WRIST ARTHROSCOPY WITH ULNA SHORTENING OSTEOTOMY (Right) ULNAR SHORTENING (Right)  Patient Location: PACU  Anesthesia Type:GA combined with regional for post-op pain  Level of Consciousness: awake, alert  and oriented  Airway & Oxygen Therapy: Patient Spontanous Breathing and Patient connected to face mask oxygen  Post-op Assessment: Report given to PACU RN and Post -op Vital signs reviewed and stable  Post vital signs: Reviewed and stable  Complications: No apparent anesthesia complications

## 2014-10-09 NOTE — H&P (Signed)
[name] is a 53 year old right hand dominant male complaining of bilateral wrist pain right much greater than left. This has been going on for a year with pain and swelling. He has seen Dr. Daylene Katayama in the past for a 2nd opinion. He has had 3 neck surgeries. He has had multiple injections to the distal radial ulnar joints bilaterally. He recalls no specific history of injury. He has not had any other treatment other than injections. There is no history of diabetes, thyroid problems, arthritis or gout. He complains of pain with rotation with lifting, writing and going to the bathroom. He was originally referred to [facility] and seen by Dr. Gladstone Lighter. He was treated with nonsteroidal anti-inflammatories, topical gel, brace, and Prednisone Dosepak all of which did not relieve all of his symptoms. Dr. Loreli Dollar at [facility] has done her cervical spine fusion level 2 through 5. He has had MRI's of both wrists revealing ulnocarpal abutment on his right side and arthritic changes on his left side. He was self-referred to Dr. Daylene Katayama.  PAST MEDICAL HISTORY:  He is allergic to nonsteroidal anti-inflammatories causing urinary problems. He is not taking any other medicines at this time. He has a history of pectus excavatum, tonsillectomy, hernia repair, neck surgery, repeated in 2009, first being in 1998, bowel obstruction in 2009, repeat neck surgery in 2012, TURP in 2012, tornwaldt cyst in 2012, and colonoscopy.   FAMILY MEDICAL HISTORY: Negative.  SOCIAL HISTORY:  He does not smoke or use alcohol. He is married.  REVIEW OF SYSTEMS: Positive for glasses, hearing loss, ringing in his ears, balance problems, headaches, otherwise negative 14 points.  [name] is an 53 y.o. male.   Chief Complaint: ulno-carpal abutment right wrist HPI: see above  History reviewed. No pertinent past medical history.  Past Surgical History  Procedure Laterality Date  . Tonsillectomy  1968  . Other surgical history  1966   sternum surgery  . Inguinal hernia repair  1986    right  . Anterior cervical discectomy  1999  . Cervical fusion  2010, 2012  . Intestinal obstruction  2010  . Transurethral resection of prostate  2012  . Thornwaldt cyst removed    . Ribs removed as child  62    Family History  Problem Relation Age of Onset  . Colon polyps Father   . Colon polyps Brother 34  . Stomach cancer Neg Hx   . Colon cancer Neg Hx   . Rectal cancer Neg Hx   . Pancreatic cancer Neg Hx    Social History:  reports that he has never smoked. He has never used smokeless tobacco. He reports that he drinks alcohol. He reports that he does not use illicit drugs.  Allergies: No Known Allergies  Medications Prior to Admission  Medication Sig Dispense Refill  . Ascorbic Acid (VITAMIN C) 1000 MG tablet Take 1,000 mg by mouth daily.        Results for orders placed or performed during the hospital encounter of 10/09/14 (from the past 48 hour(s))  Basic metabolic panel     Status: Abnormal   Collection Time: 10/08/14 10:15 AM  Result Value Ref Range   Sodium 132 (L) 137 - 147 mEq/L    Comment: PATIENT IDENTIFICATION ERROR. PLEASE DISREGARD RESULTS. ACCOUNT WILL BE CREDITED.   Potassium 4.2 3.7 - 5.3 mEq/L    Comment: PATIENT IDENTIFICATION ERROR. PLEASE DISREGARD RESULTS. ACCOUNT WILL BE CREDITED.   Chloride 92 (L) 96 - 112 mEq/L  Comment: PATIENT IDENTIFICATION ERROR. PLEASE DISREGARD RESULTS. ACCOUNT WILL BE CREDITED.   CO2 26 19 - 32 mEq/L    Comment: PATIENT IDENTIFICATION ERROR. PLEASE DISREGARD RESULTS. ACCOUNT WILL BE CREDITED.   Glucose, Bld 155 (H) 70 - 99 mg/dL    Comment: PATIENT IDENTIFICATION ERROR. PLEASE DISREGARD RESULTS. ACCOUNT WILL BE CREDITED.   BUN 15 6 - 23 mg/dL    Comment: PATIENT IDENTIFICATION ERROR. PLEASE DISREGARD RESULTS. ACCOUNT WILL BE CREDITED.   Creatinine, Ser 0.81 0.50 - 1.35 mg/dL    Comment: PATIENT IDENTIFICATION ERROR. PLEASE DISREGARD RESULTS. ACCOUNT WILL BE  CREDITED.   Calcium 9.8 8.4 - 10.5 mg/dL    Comment: PATIENT IDENTIFICATION ERROR. PLEASE DISREGARD RESULTS. ACCOUNT WILL BE CREDITED.   GFR calc non Af Amer >90 >90 mL/min    Comment: PATIENT IDENTIFICATION ERROR. PLEASE DISREGARD RESULTS. ACCOUNT WILL BE CREDITED.   GFR calc Af Amer >90 >90 mL/min    Comment: PATIENT IDENTIFICATION ERROR. PLEASE DISREGARD RESULTS. ACCOUNT WILL BE CREDITED. (NOTE) The eGFR has been calculated using the CKD EPI equation. This calculation has not been validated in all clinical situations. eGFR's persistently <90 mL/min signify possible Chronic Kidney Disease.    Anion gap 14 5 - 15    Comment: PATIENT IDENTIFICATION ERROR. PLEASE DISREGARD RESULTS. ACCOUNT WILL BE CREDITED.    No results found.   Pertinent items are noted in HPI.  Height _0  (1.778 m), weight 87.998 kg (194 lb).  General appearance: alert, cooperative and appears stated age Head: Normocephalic, without obvious abnormality Neck: no JVD Resp: clear to auscultation bilaterally Cardio: regular rate and rhythm, S1, S2 normal, no murmur, click, rub or gallop GI: soft, non-tender; bowel sounds normal; no masses,  no organomegaly Extremities: right ulnar wrist pain Pulses: 2+ and symmetric Skin: Skin color, texture, turgor normal. No rashes or lesions Neurologic: Grossly normal Incision/Wound: na  Assessment/Plan X-rays and MRI are reviewed on disc. 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  Diagnosis: Ulnocarpal abutment right wrist.  Plan: We have had a very thorough discussion with respect to ulnocarpal abutment and various treatment alternatives for this including injections, anti-inflammatories. He would like to have this surgically corrected. This has apparently been discussed with possibility of ulnar shortening osteotomy by Dr.  Daylene Katayama. We have further discussed with him we would recommend arthroscopy with ulnar shortening osteotomy. Pre, peri and post op care are discussed along with risks and complications. Patient is aware there is no guarantee with surgery, possibility of infection, injury to arteries, nerves, and tendons, incomplete relief and dystrophy. He is advised of the possibility of delayed or nonunion at the osteotomy site and requirement of secondary either fixation or re-operation with bone grafting. We would plan on bone grafting initially. Questions are encouraged and answered to the patient's satisfaction. He is scheduled for arthroscopy right wrist with debridement and ulnar shortening osteotomy open procedure with volar plate fixation and trinity bone graft as an outpatient under regional anesthesia.    Saif Peter R 10/09/2014, 9:50 AM

## 2014-10-09 NOTE — Anesthesia Postprocedure Evaluation (Signed)
  Anesthesia Post-op Note  Patient: [name]  Procedure(s) Performed: Procedure(s): RIGHT WRIST ARTHROSCOPY WITH ULNA SHORTENING OSTEOTOMY (Right) ULNAR SHORTENING (Right)  Patient Location: PACU  Anesthesia Type:General and GA combined with regional for post-op pain  Level of Consciousness: awake, alert  and oriented  Airway and Oxygen Therapy: Patient Spontanous Breathing and Patient connected to nasal cannula oxygen  Post-op Pain: None  Post-op Assessment: Post-op Vital signs reviewed, Patient's Cardiovascular Status Stable, Respiratory Function Stable, Patent Airway, No signs of Nausea or vomiting and Pain level controlled  Post-op Vital Signs: stable  Last Vitals:  Filed Vitals:   10/09/14 1415  BP: 118/78  Pulse: 82  Temp:   Resp: 16    Complications: No apparent anesthesia complications

## 2014-10-09 NOTE — Brief Op Note (Signed)
10/09/2014  1:57 PM  PATIENT:  [name]  53 y.o. male  PRE-OPERATIVE DIAGNOSIS:  ulnar carpal abutment right  POST-OPERATIVE DIAGNOSIS:  ulnar carpal abutment right  PROCEDURE:  Procedure(s): RIGHT WRIST ARTHROSCOPY WITH ULNA SHORTENING OSTEOTOMY (Right) ULNAR SHORTENING (Right)  SURGEON:  Surgeon(s) and Role:    * Daryll Brod, MD - Primary    * Leanora Cover, MD - Assisting  PHYSICIAN ASSISTANT:   ASSISTANTS: K Develle Sievers,MD   ANESTHESIA:   regional and general  EBL:  Total I/O In: 1700 [I.V.:1700] Out: -   BLOOD ADMINISTERED:none  DRAINS: none   LOCAL MEDICATIONS USED:  NONE  SPECIMEN:  No Specimen  DISPOSITION OF SPECIMEN:  N/A  COUNTS:  YES  TOURNIQUET:   Total Tourniquet Time Documented: Upper Arm (Right) - 59 minutes Total: Upper Arm (Right) - 59 minutes   DICTATION: .Other Dictation: Dictation Number [id number]  PLAN OF CARE: Discharge to home after PACU  PATIENT DISPOSITION:  PACU - hemodynamically stable.

## 2014-10-09 NOTE — Op Note (Addendum)
Dictation Number [id number] Intra-operative fluoroscopic images in the AP, lateral, and oblique views were taken and evaluated by myself.  Reduction and hardware placement were confirmed.

## 2014-10-09 NOTE — Anesthesia Postprocedure Evaluation (Signed)
  Anesthesia Post-op Note  Patient: [name]  Procedure(s) Performed: Procedure(s): RIGHT WRIST ARTHROSCOPY WITH ULNA SHORTENING OSTEOTOMY (Right) ULNAR SHORTENING (Right)  Patient Location: PACU  Anesthesia Type: General with regional for post op pain   Level of Consciousness: awake, alert  and oriented  Airway and Oxygen Therapy: Patient Spontanous Breathing  Post-op Pain: none  Post-op Assessment: Post-op Vital signs reviewed  Post-op Vital Signs: Reviewed  Last Vitals:  Filed Vitals:   10/09/14 1415  BP: 118/78  Pulse: 82  Temp:   Resp: 16    Complications: No apparent anesthesia complications

## 2014-10-09 NOTE — Progress Notes (Signed)
Assisted Dr. Jackson with right, axillary block. Side rails up, monitors on throughout procedure. See vital signs in flow sheet. Tolerated Procedure well. 

## 2014-10-09 NOTE — Anesthesia Procedure Notes (Addendum)
Anesthesia Regional Block:  Axillary brachial plexus block  Pre-Anesthetic Checklist: ,, timeout performed, Correct Patient, Correct Site, Correct Laterality, Correct Procedure, Correct Position, site marked, Risks and benefits discussed,  Surgical consent,  Pre-op evaluation,  At surgeon's request and post-op pain management  Laterality: Right and Upper  Prep: chloraprep       Needles:  Injection technique: Single-shot  Needle Type: Other   (short "B" bevel)    Needle Gauge: 22 and 22 G    Additional Needles:  Procedures: paresthesia technique Axillary brachial plexus block  Nerve Stimulator or Paresthesia:  Response: transient median nerve paresthesia,  Response: transient ulnar nerve paresthesia,   Additional Responses:   Narrative:  Start time: 10/09/2014 11:05 AM End time: 10/09/2014 11:14 AM Injection made incrementally with aspirations every 5 mL.  Events: blood aspirated  Performed by: Personally   Additional Notes: Pt identified in Holding room.  Monitors applied. Working IV access confirmed. Sterile prep R axilla.  #22ga short "B" bevel to transient median nerve and ulnar nerve paresthesiae.  Total 20cc 1.5% lidocaine with 1:200k epi and 20cc 0.5% Bupivacaine with 1:200k epi injected incrementally after negative test doses, distributed around each paresthesia.  Heme aspirated at one point, needle withdrawn and repositioned to paresthesia with no heme. Patient asymptomatic, VSS, tolerated well.  Jenita Seashore, MD   Procedure Name: LMA Insertion Date/Time: 10/09/2014 12:05 PM Performed by: Maryella Shivers Pre-anesthesia Checklist: Patient identified, Emergency Drugs available, Suction available and Patient being monitored Patient Re-evaluated:Patient Re-evaluated prior to inductionOxygen Delivery Method: Circle System Utilized Preoxygenation: Pre-oxygenation with 100% oxygen Intubation Type: IV induction Ventilation: Mask ventilation without difficulty LMA: LMA  inserted LMA Size: 4.0 Number of attempts: 1 Airway Equipment and Method: bite block Placement Confirmation: positive ETCO2 Tube secured with: Tape Dental Injury: Teeth and Oropharynx as per pre-operative assessment

## 2014-10-09 NOTE — Discharge Instructions (Addendum)
[facility] Instructions °Hand Surgery ° °Wound Care: °Keep your hand elevated above the level of your heart.  Do not allow it to dangle by your side.  Keep the dressing dry and do not remove it unless your doctor advises you to do so.  He will usually change it at the time of your post-op visit.  Moving your fingers is advised to stimulate circulation but will depend on the site of your surgery.  If you have a splint applied, your doctor will advise you regarding movement. ° °Activity: °Do not drive or operate machinery today.  Rest today and then you may return to your normal activity and work as indicated by your physician. ° °Diet:  °Drink liquids today or eat a light diet.  You may resume a regular diet tomorrow.   ° °General expectations: °Pain for two to three days. °Fingers may become slightly swollen. ° °Call your doctor if any of the following occur: °Severe pain not relieved by pain medication. °Elevated temperature. °Dressing soaked with blood. °Inability to move fingers. °White or bluish color to fingers. ° ° °Regional Anesthesia Blocks ° °1. Numbness or the inability to move the "blocked" extremity may last from 3-48 hours after placement. The length of time depends on the medication injected and your individual response to the medication. If the numbness is not going away after 48 hours, call your surgeon. ° °2. The extremity that is blocked will need to be protected until the numbness is gone and the  Strength has returned. Because you cannot feel it, you will need to take extra care to avoid injury. Because it may be weak, you may have difficulty moving it or using it. You may not know what position it is in without looking at it while the block is in effect. ° °3. For blocks in the legs and feet, returning to weight bearing and walking needs to be done carefully. You will need to wait until the numbness is entirely gone and the strength has returned. You should be able to move your leg and foot  normally before you try and bear weight or walk. You will need someone to be with you when you first try to ensure you do not fall and possibly risk injury. ° °4. Bruising and tenderness at the needle site are common side effects and will resolve in a few days. ° °5. Persistent numbness or new problems with movement should be communicated to the surgeon or the [facility] ([phone])/ [facility] ([phone]). ° ° °Post Anesthesia Home Care Instructions ° °Activity: °Get plenty of rest for the remainder of the day. A responsible adult should stay with you for 24 hours following the procedure.  °For the next 24 hours, DO NOT: °-Drive a car °-Operate machinery °-Drink alcoholic beverages °-Take any medication unless instructed by your physician °-Make any legal decisions or sign important papers. ° °Meals: °Start with liquid foods such as gelatin or soup. Progress to regular foods as tolerated. Avoid greasy, spicy, heavy foods. If nausea and/or vomiting occur, drink only clear liquids until the nausea and/or vomiting subsides. Call your physician if vomiting continues. ° °Special Instructions/Symptoms: °Your throat may feel dry or sore from the anesthesia or the breathing tube placed in your throat during surgery. If this causes discomfort, gargle with warm salt water. The discomfort should disappear within 24 hours. ° °

## 2014-10-09 NOTE — Anesthesia Preprocedure Evaluation (Addendum)
Anesthesia Evaluation  Patient identified by MRN, date of birth, ID band Patient awake    Reviewed: Allergy & Precautions, H&P , NPO status   History of Anesthesia Complications Negative for: history of anesthetic complications  Airway Mallampati: III  TM Distance: <3 FB Neck ROM: Limited    Dental  (+) Teeth Intact, Dental Advisory Given   Pulmonary neg pulmonary ROS,  breath sounds clear to auscultation        Cardiovascular negative cardio ROS  Rhythm:Regular Rate:Normal     Neuro/Psych negative neurological ROS     GI/Hepatic negative GI ROS, Neg liver ROS,   Endo/Other  negative endocrine ROS  Renal/GU negative Renal ROS     Musculoskeletal   Abdominal   Peds  Hematology negative hematology ROS (+)   Anesthesia Other Findings   Reproductive/Obstetrics                             Anesthesia Physical Anesthesia Plan  ASA: I  Anesthesia Plan: General   Post-op Pain Management:    Induction: Intravenous  Airway Management Planned: LMA  Additional Equipment:   Intra-op Plan:   Post-operative Plan:   Informed Consent: I have reviewed the patients History and Physical, chart, labs and discussed the procedure including the risks, benefits and alternatives for the proposed anesthesia with the patient or authorized representative who has indicated his/her understanding and acceptance.   Dental advisory given  Plan Discussed with: CRNA and Surgeon  Anesthesia Plan Comments: (Plan routine monitors, GA- LMA OK, axillary block for post op analgesia)        Anesthesia Quick Evaluation

## 2014-10-10 ENCOUNTER — Encounter (HOSPITAL_BASED_OUTPATIENT_CLINIC_OR_DEPARTMENT_OTHER): Payer: Self-pay | Admitting: Orthopedic Surgery

## 2014-10-10 NOTE — Op Note (Signed)
NAMECHUONG, CASEBEER                    ACCOUNT NO.:  [id number]  MEDICAL RECORD NO.:  [id number]  LOCATION:                                 FACILITY:  PHYSICIAN:  Daryll Brod, M.D.            DATE OF BIRTH:  DATE OF PROCEDURE:  10/09/2014 DATE OF DISCHARGE:                              OPERATIVE REPORT   PREOPERATIVE DIAGNOSIS:  Ulnocarpal abutment with lunotriquetral tear.  POSTOPERATIVE DIAGNOSIS:  Ulnocarpal abutment with lunotriquetral tear plus scapholunate ligament stretch.  OPERATION:  Arthroscopy with debridement triangular fibrocartilage, debridement of lunotriquetral, shrinkage of scapholunate ligament, right wrist with ulnar shortening osteotomy open.  SURGEON:  Daryll Brod, MD  ASSISTANT:  Leanora Cover, MD  ANESTHESIA:  Supraclavicular block general.  ANESTHESIOLOGIST:  Dr. Glennon Mac.  HISTORY:  The patient is a 53 year old male with a history of ulnar- sided wrist pain.  MRI revealed a tear of the triangular fibrocartilage complex with changes on the lunate distal ulna.  This has not responded to conservative treatment.  He has elected to undergo ulnar shortening osteotomy with debridement of the wrist as dictated by findings.  He is aware that there is no guarantee with the surgery, possibility of infection, recurrence of injury to arteries, nerves, tendons, incomplete relief of symptoms, and dystrophy.  In preoperative area, the patient is seen, the extremity marked by both the patient and surgeon.  Antibiotic given.  PROCEDURE IN DETAIL:  The patient was brought to the operating room, where a general anesthetic was carried out without difficulty.  He had been given a supraclavicular block in the preoperative area.  He was prepped using ChloraPrep, supine position with the right arm free.  A 3- minute dry time was allowed.  Time-out taken, confirming the patient and procedure.  The limb was placed in the Arc arthroscopy tower, 10 pounds of traction applied.  The  joint inflated with 3/4 portal.  Transverse incision made, deepened with a hemostat.  Blunt trocar used to enter the joint.  Joint was inspected.  The volar and radial wrist ligaments were intact.  Scapholunate ligament was extremely patulous with a small central tear proximally.  There were no significant degenerative changes on the articular surface of the carpal bones or distal radius. A tear of the triangular fibrocartilage complex was noted.  A tear of the lunotriquetral joint was noted with it being avulse off from both the lunate and the triquetrum.  An irrigation catheter was placed in 6U.  A 4/5 portal localized with a 22-gauge needle.  A transverse incision made, deepened with a hemostat.  Blunt trocar used to enter the joint. The joint was inspected from the ulnar side.  The tear was noted.  An angled ENT Beaver blade was then inserted and a completion of the removal of the lunotriquetral ligament which was torn and TFCC was performed.  A Gator shaver was then introduced.  Partial synovectomy performed with the Gator.  The TFCC smoothed along with the lunotriquetral debridement.  The scope was then alternated at 3/4 and 4/5 and a shrinkage performed to the scapholunate.  Further debridement of the LT and TFCC  was performed.  The midcarpal joint was inspected and significant instability was present to the lunotriquetral joint.  There were very minimal changes on the proximal aspect of the hamate.  There were no articular surface changes on either the distal aspect of the proximal carpal row and proximal aspect of the distal rather than the proximal hamate.  The scope was removed.  The tower removed.  The limb exsanguinated with an Esmarch bandage.  Tourniquet placed high and the arm was inflated to 250 mmHg.  An incision was made on the lateral aspect of his right forearm carried down through subcutaneous tissue. This was not performed between the flexor carpi ulnaris,  extensor  carpi ulnaris.  Neurovascular structures protected.  Bleeders coagulated with bipolar.  The dissection carried down to the periosteum.  This was incised.  The pronator quadratus was elevated off from the most ulnar aspect of the ulna, and an Acumed ulnar shortening plate was then applied and clamped in position.  This was stabilized.  The screws were placed distally, and the compression guide proximally along with a retaining pin.  The osteotomy was then performed removing 3 mm of bone. This was then compressed.  The remaining screws were placed to stabilize this after placing an oblique screw which measured 18 mm.  Remaining screws measured 12-13 mm.  These were sequentially put in firmly compressing the osteotomy in position.  The wound was irrigated. Trinity bone graft had been defrosted, mixed.  This was then placed around the osteotomy site.  The periosteum was then closed over this with interrupted 2-0 Vicryl sutures.  The subcutaneous tissue closed with interrupted 2-0 Vicryl, and skin with interrupted 4-0 Vicryl Rapide sutures.  Prior to closure, x-rays were taken in both AP and lateral direction revealing that the osteotomy was well coaptated, the screws laid in good position.  A sterile compressive dressing, long-arm splint was applied.  On deflation of the tourniquet, all fingers immediately pinked.  He was taken to the recovery room for observation in satisfactory condition.  He will be discharged home to return to the [facility] in 1 week on Percocet.          ______________________________ Daryll Brod, M.D.     GK/MEDQ  D:  10/09/2014  T:  10/10/2014  Job:  [id number]

## 2015-08-15 ENCOUNTER — Other Ambulatory Visit (HOSPITAL_COMMUNITY): Payer: Self-pay | Admitting: Physician Assistant

## 2015-08-15 ENCOUNTER — Ambulatory Visit (HOSPITAL_COMMUNITY)
Admission: RE | Admit: 2015-08-15 | Discharge: 2015-08-15 | Disposition: A | Discharge status: Home / Self Care | Payer: PRIVATE HEALTH INSURANCE | Source: Ambulatory Visit | Attending: Physician Assistant | Admitting: Physician Assistant

## 2015-08-15 DIAGNOSIS — Z6828 Body mass index (BMI) 28.0-28.9, adult: Secondary | ICD-10-CM | POA: Insufficient documentation

## 2015-08-15 DIAGNOSIS — Q676 Pectus excavatum: Secondary | ICD-10-CM | POA: Diagnosis not present

## 2015-08-15 DIAGNOSIS — R05 Cough: Secondary | ICD-10-CM | POA: Diagnosis present

## 2015-08-15 DIAGNOSIS — R059 Cough, unspecified: Secondary | ICD-10-CM

## 2015-08-15 DIAGNOSIS — E663 Overweight: Secondary | ICD-10-CM | POA: Insufficient documentation

## 2017-09-20 ENCOUNTER — Encounter: Payer: Self-pay | Admitting: Gastroenterology

## 2017-09-23 ENCOUNTER — Inpatient Hospital Stay (HOSPITAL_COMMUNITY)
Admission: EM | Admit: 2017-09-23 | Discharge: 2017-09-26 | DRG: 390 | Disposition: A | Discharge status: Home / Self Care | Payer: No Typology Code available for payment source | Attending: Internal Medicine | Admitting: Internal Medicine

## 2017-09-23 ENCOUNTER — Encounter (HOSPITAL_COMMUNITY): Payer: Self-pay

## 2017-09-23 DIAGNOSIS — K56609 Unspecified intestinal obstruction, unspecified as to partial versus complete obstruction: Secondary | ICD-10-CM

## 2017-09-23 DIAGNOSIS — K5651 Intestinal adhesions [bands], with partial obstruction: Secondary | ICD-10-CM | POA: Diagnosis not present

## 2017-09-23 DIAGNOSIS — Z79899 Other long term (current) drug therapy: Secondary | ICD-10-CM

## 2017-09-23 DIAGNOSIS — Z981 Arthrodesis status: Secondary | ICD-10-CM

## 2017-09-23 MED ORDER — HYDROMORPHONE HCL 1 MG/ML IJ SOLN
1.0000 mg | Freq: Once | INTRAMUSCULAR | Status: AC
  Administered 2017-09-24: 1 mg via INTRAVENOUS
  Filled 2017-09-23: qty 1

## 2017-09-23 MED ORDER — ONDANSETRON HCL 4 MG/2ML IJ SOLN
4.0000 mg | Freq: Once | INTRAMUSCULAR | Status: AC
  Administered 2017-09-24: 4 mg via INTRAVENOUS
  Filled 2017-09-23: qty 2

## 2017-09-23 MED ORDER — SODIUM CHLORIDE 0.9 % IV BOLUS (SEPSIS)
500.0000 mL | Freq: Once | INTRAVENOUS | Status: AC
  Administered 2017-09-24: 500 mL via INTRAVENOUS

## 2017-09-23 NOTE — ED Triage Notes (Signed)
Pt reports severe epigastric pain that started approx 5 pm, denies vomiting or diarrhea.  Pain comes in waves, states feels like a knot

## 2017-09-24 ENCOUNTER — Encounter (HOSPITAL_COMMUNITY): Payer: Self-pay | Admitting: Family Medicine

## 2017-09-24 ENCOUNTER — Emergency Department (HOSPITAL_COMMUNITY): Payer: No Typology Code available for payment source

## 2017-09-24 ENCOUNTER — Inpatient Hospital Stay (HOSPITAL_COMMUNITY): Payer: No Typology Code available for payment source

## 2017-09-24 DIAGNOSIS — K5651 Intestinal adhesions [bands], with partial obstruction: Secondary | ICD-10-CM | POA: Diagnosis present

## 2017-09-24 DIAGNOSIS — K56609 Unspecified intestinal obstruction, unspecified as to partial versus complete obstruction: Secondary | ICD-10-CM | POA: Diagnosis not present

## 2017-09-24 DIAGNOSIS — Z981 Arthrodesis status: Secondary | ICD-10-CM | POA: Diagnosis not present

## 2017-09-24 DIAGNOSIS — Z79899 Other long term (current) drug therapy: Secondary | ICD-10-CM | POA: Diagnosis not present

## 2017-09-24 LAB — CBC WITH DIFFERENTIAL/PLATELET
BASOS PCT: 0 %
Basophils Absolute: 0.1 10*3/uL (ref 0.0–0.1)
Eosinophils Absolute: 0.1 10*3/uL (ref 0.0–0.7)
Eosinophils Relative: 0 %
HEMATOCRIT: 45.8 % (ref 39.0–52.0)
Hemoglobin: 16 g/dL (ref 13.0–17.0)
Lymphocytes Relative: 18 %
Lymphs Abs: 2.4 10*3/uL (ref 0.7–4.0)
MCH: 30.6 pg (ref 26.0–34.0)
MCHC: 34.9 g/dL (ref 30.0–36.0)
MCV: 87.6 fL (ref 78.0–100.0)
MONO ABS: 1.5 10*3/uL — AB (ref 0.1–1.0)
Monocytes Relative: 11 %
Neutro Abs: 9.6 10*3/uL — ABNORMAL HIGH (ref 1.7–7.7)
Neutrophils Relative %: 71 %
Platelets: 209 10*3/uL (ref 150–400)
RBC: 5.23 MIL/uL (ref 4.22–5.81)
RDW: 12.8 % (ref 11.5–15.5)
WBC: 13.6 10*3/uL — ABNORMAL HIGH (ref 4.0–10.5)

## 2017-09-24 LAB — CBC
HEMATOCRIT: 46.3 % (ref 39.0–52.0)
HEMOGLOBIN: 15.7 g/dL (ref 13.0–17.0)
MCH: 30.2 pg (ref 26.0–34.0)
MCHC: 33.9 g/dL (ref 30.0–36.0)
MCV: 89 fL (ref 78.0–100.0)
Platelets: 209 10*3/uL (ref 150–400)
RBC: 5.2 MIL/uL (ref 4.22–5.81)
RDW: 13 % (ref 11.5–15.5)
WBC: 12.7 10*3/uL — AB (ref 4.0–10.5)

## 2017-09-24 LAB — BASIC METABOLIC PANEL
ANION GAP: 9 (ref 5–15)
BUN: 12 mg/dL (ref 6–20)
CHLORIDE: 104 mmol/L (ref 101–111)
CO2: 28 mmol/L (ref 22–32)
Calcium: 9.7 mg/dL (ref 8.9–10.3)
Creatinine, Ser: 0.94 mg/dL (ref 0.61–1.24)
GFR calc Af Amer: >60 mL/min (ref >60)
Glucose, Bld: 128 mg/dL — ABNORMAL HIGH (ref 65–99)
POTASSIUM: 4.3 mmol/L (ref 3.5–5.1)
SODIUM: 141 mmol/L (ref 135–145)

## 2017-09-24 LAB — I-STAT TROPONIN, ED: Troponin i, poc: 0.02 ng/mL (ref 0.00–0.08)

## 2017-09-24 LAB — COMPREHENSIVE METABOLIC PANEL
ALT: 22 U/L (ref 17–63)
AST: 23 U/L (ref 15–41)
Albumin: 4.5 g/dL (ref 3.5–5.0)
Alkaline Phosphatase: 92 U/L (ref 38–126)
Anion gap: 11 (ref 5–15)
BUN: 14 mg/dL (ref 6–20)
CALCIUM: 9.6 mg/dL (ref 8.9–10.3)
CHLORIDE: 103 mmol/L (ref 101–111)
CO2: 24 mmol/L (ref 22–32)
CREATININE: 0.94 mg/dL (ref 0.61–1.24)
GFR calc Af Amer: >60 mL/min (ref >60)
Glucose, Bld: 138 mg/dL — ABNORMAL HIGH (ref 65–99)
Potassium: 3.8 mmol/L (ref 3.5–5.1)
Sodium: 138 mmol/L (ref 135–145)
TOTAL PROTEIN: 7.4 g/dL (ref 6.5–8.1)
Total Bilirubin: 0.5 mg/dL (ref 0.3–1.2)

## 2017-09-24 LAB — LIPASE, BLOOD: LIPASE: 29 U/L (ref 11–51)

## 2017-09-24 MED ORDER — POTASSIUM CHLORIDE IN NACL 20-0.9 MEQ/L-% IV SOLN
INTRAVENOUS | Status: DC
  Administered 2017-09-24 – 2017-09-25 (×4): via INTRAVENOUS

## 2017-09-24 MED ORDER — IOPAMIDOL (ISOVUE-300) INJECTION 61%
100.0000 mL | Freq: Once | INTRAVENOUS | Status: AC | PRN
  Administered 2017-09-24: 100 mL via INTRAVENOUS

## 2017-09-24 MED ORDER — FAMOTIDINE IN NACL 20-0.9 MG/50ML-% IV SOLN
20.0000 mg | Freq: Two times a day (BID) | INTRAVENOUS | Status: DC
  Administered 2017-09-24 – 2017-09-25 (×4): 20 mg via INTRAVENOUS
  Filled 2017-09-24 (×5): qty 50

## 2017-09-24 MED ORDER — ZOLPIDEM TARTRATE 5 MG PO TABS
5.0000 mg | ORAL_TABLET | Freq: Once | ORAL | Status: AC
  Administered 2017-09-24: 5 mg
  Filled 2017-09-24: qty 1

## 2017-09-24 MED ORDER — LORATADINE 10 MG PO TABS
10.0000 mg | ORAL_TABLET | Freq: Every day | ORAL | Status: DC
  Administered 2017-09-24 – 2017-09-26 (×3): 10 mg via ORAL
  Filled 2017-09-24 (×3): qty 1

## 2017-09-24 MED ORDER — PHENOL 1.4 % MT LIQD
1.0000 | OROMUCOSAL | Status: DC | PRN
  Administered 2017-09-24: 1 via OROMUCOSAL
  Filled 2017-09-24: qty 177

## 2017-09-24 MED ORDER — HYDRALAZINE HCL 20 MG/ML IJ SOLN: 10.0000 mg | INTRAMUSCULAR | Status: DC | PRN

## 2017-09-24 MED ORDER — ONDANSETRON HCL 4 MG/2ML IJ SOLN: 4.0000 mg | Freq: Four times a day (QID) | INTRAMUSCULAR | Status: DC | PRN

## 2017-09-24 MED ORDER — ONDANSETRON HCL 4 MG PO TABS: 4.0000 mg | ORAL_TABLET | Freq: Four times a day (QID) | ORAL | Status: DC | PRN

## 2017-09-24 MED ORDER — ACETAMINOPHEN 650 MG RE SUPP: 650.0000 mg | Freq: Four times a day (QID) | RECTAL | Status: DC | PRN

## 2017-09-24 MED ORDER — HYDROMORPHONE HCL 1 MG/ML IJ SOLN
0.5000 mg | INTRAMUSCULAR | Status: DC | PRN
  Administered 2017-09-24 – 2017-09-25 (×4): 1 mg via INTRAVENOUS
  Filled 2017-09-24 (×4): qty 1

## 2017-09-24 MED ORDER — ACETAMINOPHEN 325 MG PO TABS
650.0000 mg | ORAL_TABLET | Freq: Four times a day (QID) | ORAL | Status: DC | PRN
  Administered 2017-09-25: 650 mg via ORAL
  Filled 2017-09-24: qty 2

## 2017-09-24 MED ORDER — LIDOCAINE HCL 2 % EX GEL
CUTANEOUS | Status: AC
  Filled 2017-09-24: qty 10

## 2017-09-24 MED ORDER — LIDOCAINE HCL 2 % EX GEL: 1.0000 "application " | Freq: Once | CUTANEOUS | Status: DC

## 2017-09-24 MED ORDER — ENOXAPARIN SODIUM 40 MG/0.4ML ~~LOC~~ SOLN
40.0000 mg | SUBCUTANEOUS | Status: DC
  Administered 2017-09-24 – 2017-09-25 (×2): 40 mg via SUBCUTANEOUS
  Filled 2017-09-24 (×3): qty 0.4

## 2017-09-24 NOTE — Progress Notes (Signed)
Patient admitted to the hospital earlier this morning by Dr. Myna Hidalgo.  Patient seen and examined.  He has NG tube in place with fair amount of output.  Abdomen appears distended, nontender, bowel sounds are active.  He reports passing flatus but has not had a bowel movement.  No nausea or vomiting.  He was admitted to the hospital with small bowel obstruction, felt to be related to adhesive disease.  General surgery is following.  Currently he is on NG tube decompression with bowel rest.  Encourage ambulation.  Hopefully, his obstruction should resolve with conservative measures.  [name],Douglas

## 2017-09-24 NOTE — ED Provider Notes (Signed)
[facility] EMERGENCY DEPARTMENT Provider Note   CSN: [id number] Arrival date & time: 09/23/17  2329     History   Chief Complaint Chief Complaint  Patient presents with  . Abdominal Pain    HPI [name] is a 56 y.o. male.  Patient presents to the emergency department for evaluation of severe upper abdominal pain that began suddenly at 5 PM.  Patient reports that the pain waxes and wanes, comes in waves.  He has had nausea but has not had vomiting or diarrhea.  He has noticed that his stomach is very distended.  He reports previous history of small bowel obstruction.      History reviewed. No pertinent past medical history.  There are no active problems to display for this patient.   Past Surgical History:  Procedure Laterality Date  . ANTERIOR CERVICAL DISCECTOMY  1999  . CERVICAL FUSION  2010, 2012  . [place]   right  . intestinal obstruction  2010  . OTHER SURGICAL HISTORY  1966   sternum surgery  . ribs removed as child  56  . thornwaldt cyst removed    . TONSILLECTOMY  1968  . TRANSURETHRAL RESECTION OF PROSTATE  2012  . ULNA OSTEOTOMY Right 10/09/2014   Procedure: ULNAR SHORTENING;  Surgeon: Daryll Brod, MD;  Location: [place];  Service: Orthopedics;  Laterality: Right;  . WRIST ARTHROSCOPY Right 10/09/2014   Procedure: RIGHT WRIST ARTHROSCOPY WITH ULNA SHORTENING OSTEOTOMY;  Surgeon: Daryll Brod, MD;  Location: [place];  Service: Orthopedics;  Laterality: Right;       Home Medications    Prior to Admission medications   Medication Sig Start Date End Date Taking? Authorizing Provider  Ascorbic Acid (VITAMIN C) 1000 MG tablet Take 1,000 mg by mouth daily.     Yes [provider]  oxyCODONE-acetaminophen (PERCOCET) 10-325 MG per tablet Take 1 tablet by mouth every 4 (four) hours as needed for pain. 10/09/14   Daryll Brod, MD    Family History Family History  Problem Relation Age of  Onset  . Colon polyps Father   . Colon polyps Brother 37  . Stomach cancer Neg Hx   . Colon cancer Neg Hx   . Rectal cancer Neg Hx   . Pancreatic cancer Neg Hx     Social History Social History  Substance Use Topics  . Smoking status: Never Smoker  . Smokeless tobacco: Never Used  . Alcohol use Yes     Comment: rare     Allergies   Patient has no known allergies.   Review of Systems Review of Systems  Gastrointestinal: Positive for abdominal distention and abdominal pain.  All other systems reviewed and are negative.    Physical Exam Updated Vital Signs BP (!) 147/80   Pulse 94   Temp 97.9 F (36.6 C) (Oral)   Resp 19   Ht 5\' 9"  (1.753 m)   Wt 91.2 kg (201 lb)   SpO2 95%   BMI 29.68 kg/m   Physical Exam  Constitutional: He is oriented to person, place, and time. He appears well-developed and well-nourished. No distress.  HENT:  Head: Normocephalic and atraumatic.  Right Ear: Hearing normal.  Left Ear: Hearing normal.  Nose: Nose normal.  Mouth/Throat: Oropharynx is clear and moist and mucous membranes are normal.  Eyes: Pupils are equal, round, and reactive to light. Conjunctivae and EOM are normal.  Neck: Normal range of motion. Neck supple.  Cardiovascular: Regular rhythm, S1 normal and S2 normal.  Exam reveals no gallop and no friction rub.   No murmur heard. Pulmonary/Chest: Effort normal and breath sounds normal. No respiratory distress. He exhibits no tenderness.  Abdominal: Soft. Normal appearance. He exhibits distension. Bowel sounds are decreased. There is no hepatosplenomegaly. There is generalized tenderness. There is no rebound, no guarding, no tenderness at McBurney's point and negative Murphy's sign. No hernia.  Musculoskeletal: Normal range of motion.  Neurological: He is alert and oriented to person, place, and time. He has normal strength. No cranial nerve deficit or sensory deficit. Coordination normal. GCS eye subscore is 4. GCS verbal  subscore is 5. GCS motor subscore is 6.  Skin: Skin is warm, dry and intact. No rash noted. No cyanosis.  Psychiatric: He has a normal mood and affect. His speech is normal and behavior is normal. Thought content normal.  Nursing note and vitals reviewed.    ED Treatments / Results  Labs (all labs ordered are listed, but only abnormal results are displayed) Labs Reviewed  CBC WITH DIFFERENTIAL/PLATELET - Abnormal; Notable for the following:       Result Value   WBC 13.6 (*)    Neutro Abs 9.6 (*)    Monocytes Absolute 1.5 (*)    All other components within normal limits  COMPREHENSIVE METABOLIC PANEL - Abnormal; Notable for the following:    Glucose, Bld 138 (*)    All other components within normal limits  LIPASE, BLOOD  I-STAT TROPONIN, ED    EKG  EKG Interpretation  Date/Time:  Friday September 24 2017 00:08:31 EDT Ventricular Rate:  80 PR Interval:    QRS Duration: 84 QT Interval:  369 QTC Calculation: 426 R Axis:   92 Text Interpretation:  Sinus rhythm Anteroseptal infarct, age indeterminate Confirmed by Orpah Greek [phone]) on 09/24/2017 12:51:16 AM       Radiology Ct Abdomen Pelvis W Contrast  Result Date: 09/24/2017 CLINICAL DATA:  56 year old male with abdominal pain. EXAM: CT ABDOMEN AND PELVIS WITH CONTRAST TECHNIQUE: Multidetector CT imaging of the abdomen and pelvis was performed using the standard protocol following bolus administration of intravenous contrast. CONTRAST:  164mL ISOVUE-300 IOPAMIDOL (ISOVUE-300) INJECTION 61% COMPARISON:  Abdominal CT dated 12/30/2006 FINDINGS: Lower chest: Left lung base linear atelectasis/ scarring. The visualized lung bases are otherwise clear. There is apparent overall decrease in the left lung volume with mild shift of the mediastinum into the left hemithorax which may be related to pectus excavatum deformity. No intra-abdominal free air or free fluid. Hepatobiliary: No focal liver abnormality is seen. No gallstones,  gallbladder wall thickening, or biliary dilatation. Pancreas: Unremarkable. No pancreatic ductal dilatation or surrounding inflammatory changes. Spleen: Normal in size without focal abnormality. Adrenals/Urinary Tract: The adrenal glands are unremarkable. There is no hydronephrosis on either side. Subcentimeter left renal upper pole hypodense lesion is too small to characterize but likely represents a cyst. The kidneys are otherwise unremarkable. There is symmetric enhancement and excretion of contrast by both kidneys. The visualized ureters are unremarkable. The urinary bladder is distended. Stomach/Bowel: The stomach is distended with oral content. There is no evidence of gastric outlet obstruction. There is a small right paraumbilical hernia with partial herniation of a short segment of small bowel. In addition there is abutment of multiple loops of small bowel to the anterior peritoneal wall along the incisional scar at the level of the umbilicus and slightly superior to the umbilicus compatible with adhesions. There is a small-bowel obstruction with  transition in the anterior mid abdomen at the level of the adhesion (series 2 image 51). No definite obstruction associated with the umbilical hernia. There is dilatation of small-bowel loops proximal to the point of obstruction measuring up to 3 cm. The distal small bowel loops and the colon are collapsed. The appendix is unremarkable. Vascular/Lymphatic: There is moderate aortoiliac atherosclerotic disease. The IVC is grossly unremarkable. No portal venous gas. There is no adenopathy. Reproductive: The prostate and seminal vesicles are grossly unremarkable. No pelvic mass. Other: Small fat containing bilateral inguinal hernias. Midline vertical anterior abdominal wall incisional scar. Musculoskeletal: Degenerative changes of the spine with disc desiccation at L5-S1. Grade 1 L5-S1 retrolisthesis. No acute osseous pathology. IMPRESSION: 1. Small-bowel obstruction  with transition in the anterior mid abdomen just above the umbilicus secondary to adhesions. A small umbilical hernia with focal partial herniation of small bowel noted. No definite evidence of obstruction at the level of the hernia. 2. Aortic Atherosclerosis (ICD10-I70.0). Electronically Signed   By: Anner Crete M.D.   On: 09/24/2017 01:50    Procedures Procedures (including critical care time)  Medications Ordered in ED Medications  sodium chloride 0.9 % bolus 500 mL (0 mLs Intravenous Stopped 09/24/17 0145)  HYDROmorphone (DILAUDID) injection 1 mg (1 mg Intravenous Given 09/24/17 0010)  ondansetron (ZOFRAN) injection 4 mg (4 mg Intravenous Given 09/24/17 0009)  iopamidol (ISOVUE-300) 61 % injection 100 mL (100 mLs Intravenous Contrast Given 09/24/17 0106)     Initial Impression / Assessment and Plan / ED Course  I have reviewed the triage vital signs and the nursing notes.  Pertinent labs & imaging results that were available during my care of the patient were reviewed by me and considered in my medical decision making (see chart for details).     Patient presents to the emergency department for evaluation of abdominal pain.  Patient reports history of small bowel obstruction requiring surgery immediately after having an anterior cervical fusion surgery years ago.  He has not had any trouble since.  Lamination does reveal distended tympanitic abdomen that is generally tender.  No signs of acute surgical process.  He does have bowel sounds present.  Patient does report a bowel movement earlier tonight but has not passed any gas since.  He has a slight leukocytosis, otherwise labs are unremarkable.  CT scan does confirm small bowel obstruction with transition point secondary to adhesion.  NG tube will be placed.  Discussed briefly with Dr. Arnoldo Morale, on-call for general surgery.  Asks for hospitalist admission and will consult.  Final Clinical Impressions(s) / ED Diagnoses   Final  diagnoses:  SBO (small bowel obstruction) ([place])    New Prescriptions New Prescriptions   No medications on file     Orpah Greek, MD 09/24/17 [phone]

## 2017-09-24 NOTE — Consult Note (Signed)
Reason for Consult: Small bowel obstruction Referring Physician: Dr. Nathen [name] [name] is an 56 y.o. male.  HPI: Patient is a 56 year old white male who presented to the emergency room yesterday evening with worsening epigastric and upper abdominal pain.  He was nauseated.  He was seen in the emergency room and a CT scan of the abdomen revealed a small bowel obstruction with a transition point in the mid abdomen secondary most likely to adhesive disease.  He states he had multiple bowel movements yesterday.  An NG tube was placed and this morning, he states he has no abdominal pain.  He previously had a small bowel obstruction requiring exploratory laparotomy at [facility] in 2014.  He states he has not had an episode of bowel obstruction since that time.  He denies any fever, chills.  History reviewed. No pertinent past medical history.  Past Surgical History:  Procedure Laterality Date  . ANTERIOR CERVICAL DISCECTOMY  1999  . CERVICAL FUSION  2010, 2012  . [place]   right  . intestinal obstruction  2010  . OTHER SURGICAL HISTORY  1966   sternum surgery  . ribs removed as child  22  . thornwaldt cyst removed    . TONSILLECTOMY  1968  . TRANSURETHRAL RESECTION OF PROSTATE  2012  . ULNA OSTEOTOMY Right 10/09/2014   Procedure: ULNAR SHORTENING;  Surgeon: Daryll Brod, MD;  Location: [place];  Service: Orthopedics;  Laterality: Right;  . WRIST ARTHROSCOPY Right 10/09/2014   Procedure: RIGHT WRIST ARTHROSCOPY WITH ULNA SHORTENING OSTEOTOMY;  Surgeon: Daryll Brod, MD;  Location: [place];  Service: Orthopedics;  Laterality: Right;    Family History  Problem Relation Age of Onset  . Colon polyps Father   . Colon polyps Brother 22  . Stomach cancer Neg Hx   . Colon cancer Neg Hx   . Rectal cancer Neg Hx   . Pancreatic cancer Neg Hx     Social History:  reports that he has never smoked. He has never used smokeless tobacco.  He reports that he drinks alcohol. He reports that he does not use drugs.  Allergies: No Known Allergies  Medications:  Scheduled: . enoxaparin (LOVENOX) injection  40 mg Subcutaneous Q24H  . lidocaine  1 application Other Once  . lidocaine        Results for orders placed or performed during the hospital encounter of 09/23/17 (from the past 48 hour(s))  CBC with Differential/Platelet     Status: Abnormal   Collection Time: 09/23/17 11:59 PM  Result Value Ref Range   WBC 13.6 (H) 4.0 - 10.5 K/uL   RBC 5.23 4.22 - 5.81 MIL/uL   Hemoglobin 16.0 13.0 - 17.0 g/dL   HCT 45.8 39.0 - 52.0 %   MCV 87.6 78.0 - 100.0 fL   MCH 30.6 26.0 - 34.0 pg   MCHC 34.9 30.0 - 36.0 g/dL   RDW 12.8 11.5 - 15.5 %   Platelets 209 150 - 400 K/uL   Neutrophils Relative % 71 %   Neutro Abs 9.6 (H) 1.7 - 7.7 K/uL   Lymphocytes Relative 18 %   Lymphs Abs 2.4 0.7 - 4.0 K/uL   Monocytes Relative 11 %   Monocytes Absolute 1.5 (H) 0.1 - 1.0 K/uL   Eosinophils Relative 0 %   Eosinophils Absolute 0.1 0.0 - 0.7 K/uL   Basophils Relative 0 %   Basophils Absolute 0.1 0.0 - 0.1 K/uL  Comprehensive metabolic panel     Status: Abnormal   Collection Time: 09/23/17 11:59 PM  Result Value Ref Range   Sodium 138 135 - 145 mmol/L   Potassium 3.8 3.5 - 5.1 mmol/L   Chloride 103 101 - 111 mmol/L   CO2 24 22 - 32 mmol/L   Glucose, Bld 138 (H) 65 - 99 mg/dL   BUN 14 6 - 20 mg/dL   Creatinine, Ser 0.94 0.61 - 1.24 mg/dL   Calcium 9.6 8.9 - 10.3 mg/dL   Total Protein 7.4 6.5 - 8.1 g/dL   Albumin 4.5 3.5 - 5.0 g/dL   AST 23 15 - 41 U/L   ALT 22 17 - 63 U/L   Alkaline Phosphatase 92 38 - 126 U/L   Total Bilirubin 0.5 0.3 - 1.2 mg/dL   GFR calc non Af Amer >60 >60 mL/min   GFR calc Af Amer >60 >60 mL/min    Comment: (NOTE) The eGFR has been calculated using the CKD EPI equation. This calculation has not been validated in all clinical situations. eGFR's persistently <60 mL/min signify possible Chronic  Kidney Disease.    Anion gap 11 5 - 15  Lipase, blood     Status: None   Collection Time: 09/23/17 11:59 PM  Result Value Ref Range   Lipase 29 11 - 51 U/L  I-stat troponin, ED     Status: None   Collection Time: 09/24/17 12:23 AM  Result Value Ref Range   Troponin i, poc 0.02 0.00 - 0.08 ng/mL   Comment 3            Comment: Due to the release kinetics of cTnI, a negative result within the first hours of the onset of symptoms does not rule out myocardial infarction with certainty. If myocardial infarction is still suspected, repeat the test at appropriate intervals.   Basic metabolic panel     Status: Abnormal   Collection Time: 09/24/17  5:01 AM  Result Value Ref Range   Sodium 141 135 - 145 mmol/L   Potassium 4.3 3.5 - 5.1 mmol/L   Chloride 104 101 - 111 mmol/L   CO2 28 22 - 32 mmol/L   Glucose, Bld 128 (H) 65 - 99 mg/dL   BUN 12 6 - 20 mg/dL   Creatinine, Ser 0.94 0.61 - 1.24 mg/dL   Calcium 9.7 8.9 - 10.3 mg/dL   GFR calc non Af Amer >60 >60 mL/min   GFR calc Af Amer >60 >60 mL/min    Comment: (NOTE) The eGFR has been calculated using the CKD EPI equation. This calculation has not been validated in all clinical situations. eGFR's persistently <60 mL/min signify possible Chronic Kidney Disease.    Anion gap 9 5 - 15  CBC     Status: Abnormal   Collection Time: 09/24/17  5:01 AM  Result Value Ref Range   WBC 12.7 (H) 4.0 - 10.5 K/uL   RBC 5.20 4.22 - 5.81 MIL/uL   Hemoglobin 15.7 13.0 - 17.0 g/dL   HCT 46.3 39.0 - 52.0 %   MCV 89.0 78.0 - 100.0 fL   MCH 30.2 26.0 - 34.0 pg   MCHC 33.9 30.0 - 36.0 g/dL   RDW 13.0 11.5 - 15.5 %   Platelets 209 150 - 400 K/uL    Dg Abdomen 1 View  Result Date: 09/24/2017 CLINICAL DATA:  56-year-old male with NG tube placement. EXAM: ABDOMEN - 1 VIEW COMPARISON:  Abdominal CT dated 11/24/2016 FINDINGS: Partially visualized enteric   tube with tip at the level of the diaphragm and side-port located in the distal third of the  esophagus. Recommend further advancing the tube into the stomach. Air distended stomach. There is atelectatic changes of the lung bases. IMPRESSION: Enteric tube in the distal esophagus. Recommend further advancing the tube into the stomach. Electronically Signed   By: Anner Crete M.D.   On: 09/24/2017 03:37   Ct Abdomen Pelvis W Contrast  Result Date: 09/24/2017 CLINICAL DATA:  56 year old male with abdominal pain. EXAM: CT ABDOMEN AND PELVIS WITH CONTRAST TECHNIQUE: Multidetector CT imaging of the abdomen and pelvis was performed using the standard protocol following bolus administration of intravenous contrast. CONTRAST:  151m ISOVUE-300 IOPAMIDOL (ISOVUE-300) INJECTION 61% COMPARISON:  Abdominal CT dated 12/30/2006 FINDINGS: Lower chest: Left lung base linear atelectasis/ scarring. The visualized lung bases are otherwise clear. There is apparent overall decrease in the left lung volume with mild shift of the mediastinum into the left hemithorax which [name] be related to pectus excavatum deformity. No intra-abdominal free air or free fluid. Hepatobiliary: No focal liver abnormality is seen. No gallstones, gallbladder wall thickening, or biliary dilatation. Pancreas: Unremarkable. No pancreatic ductal dilatation or surrounding inflammatory changes. Spleen: Normal in size without focal abnormality. Adrenals/Urinary Tract: The adrenal glands are unremarkable. There is no hydronephrosis on either side. Subcentimeter left renal upper pole hypodense lesion is too small to characterize but likely represents a cyst. The kidneys are otherwise unremarkable. There is symmetric enhancement and excretion of contrast by both kidneys. The visualized ureters are unremarkable. The urinary bladder is distended. Stomach/Bowel: The stomach is distended with oral content. There is no evidence of gastric outlet obstruction. There is a small right paraumbilical hernia with partial herniation of a short segment of small bowel.  In addition there is abutment of multiple loops of small bowel to the anterior peritoneal wall along the incisional scar at the level of the umbilicus and slightly superior to the umbilicus compatible with adhesions. There is a small-bowel obstruction with transition in the anterior mid abdomen at the level of the adhesion (series 2 image 51). No definite obstruction associated with the umbilical hernia. There is dilatation of small-bowel loops proximal to the point of obstruction measuring up to 3 cm. The distal small bowel loops and the colon are collapsed. The appendix is unremarkable. Vascular/Lymphatic: There is moderate aortoiliac atherosclerotic disease. The IVC is grossly unremarkable. No portal venous gas. There is no adenopathy. Reproductive: The prostate and seminal vesicles are grossly unremarkable. No pelvic mass. Other: Small fat containing bilateral inguinal hernias. Midline vertical anterior abdominal wall incisional scar. Musculoskeletal: Degenerative changes of the spine with disc desiccation at L5-S1. Grade 1 L5-S1 retrolisthesis. No acute osseous pathology. IMPRESSION: 1. Small-bowel obstruction with transition in the anterior mid abdomen just above the umbilicus secondary to adhesions. A small umbilical hernia with focal partial herniation of small bowel noted. No definite evidence of obstruction at the level of the hernia. 2. Aortic Atherosclerosis (ICD10-I70.0). Electronically Signed   By: AAnner CreteM.D.   On: 09/24/2017 01:50    ROS:  Pertinent items are noted in HPI.  Blood pressure 132/85, pulse 88, temperature 98.3 F (36.8 C), temperature source Oral, resp. rate 15, height 5' 9" (1.753 m), weight 205 lb 4 oz (93.1 kg), SpO2 94 %. Physical Exam: Well-developed and well-nourished white male no acute distress Head is normocephalic, atraumatic Lungs clear to auscultation with equal breath sounds bilaterally Heart examination reveals regular rate and rhythm without S3, S4,  murmurs Abdomen is soft and not particularly distended.  No tenderness is noted.  Minimal bowel sounds appreciated.  No rigidity is noted.  Easily reducible umbilical hernia.  CT scan report reviewed  Assessment/Plan: Impression: Small bowel obstruction most likely secondary to adhesive disease Plan: Continue NG tube decompression for 24 hours.  Will reevaluate in a.m.  No need for acute surgical intervention at this time.  Aviva Signs 09/24/2017, 10:14 AM

## 2017-09-24 NOTE — H&P (Signed)
History and Physical    [name]:[id number] DOB: April 26, 1961 DOA: 09/23/2017  PCP: Cory Munch, PA-C   Patient coming from: Home  Chief Complaint: Abdominal pain, nausea   HPI: [name] is a 56 y.o. male with medical history significant for remote small bowel obstruction requiring lysis, now presenting to the emergency department with severe abdominal pain and nausea.  Patient reports that he was in his usual state of health and was having an uneventful day until early this evening when he developed pain in his epigastrium that quickly became severe.  Pain is severe, sharp, localized to the epigastrium, and without alleviating or exacerbating factors identified.  There is been significant nausea associated with this, but the patient denies any vomiting or diarrhea.  He denies melena or hematochezia and also denies fevers or chills.  ED Course: Upon arrival to the ED, patient is found to be afebrile, saturating well on room air, slightly tachycardic, and with vitals otherwise stable.  EKG features a normal sinus rhythm, chemistry panel is unremarkable, CBC is notable for leukocytosis to 13,600, and troponin is within normal limits.  CT of the abdomen SBO with transition in the anterior mid abdomen, just above the umbilicus and secondary to adhesions.  Surgery was consulted by the ED physician and placed, 500 cc normal saline was given, stable and in no apparent respiratory distress, and he will be admitted to the medical/surgical unit for ongoing evaluation and management of severe abdominal pain secondary to SBO.  Review of Systems:  All other systems reviewed and apart from HPI, are negative.  History reviewed. No pertinent past medical history.  Past Surgical History:  Procedure Laterality Date  . ANTERIOR CERVICAL DISCECTOMY  1999  . CERVICAL FUSION  2010, 2012  . [place]   right  . intestinal obstruction  2010  . OTHER SURGICAL HISTORY  1966   sternum  surgery  . ribs removed as child  54  . thornwaldt cyst removed    . TONSILLECTOMY  1968  . TRANSURETHRAL RESECTION OF PROSTATE  2012  . ULNA OSTEOTOMY Right 10/09/2014   Procedure: ULNAR SHORTENING;  Surgeon: Daryll Brod, MD;  Location: [place];  Service: Orthopedics;  Laterality: Right;  . WRIST ARTHROSCOPY Right 10/09/2014   Procedure: RIGHT WRIST ARTHROSCOPY WITH ULNA SHORTENING OSTEOTOMY;  Surgeon: Daryll Brod, MD;  Location: [place];  Service: Orthopedics;  Laterality: Right;     reports that he has never smoked. He has never used smokeless tobacco. He reports that he drinks alcohol. He reports that he does not use drugs.  No Known Allergies  Family History  Problem Relation Age of Onset  . Colon polyps Father   . Colon polyps Brother 81  . Stomach cancer Neg Hx   . Colon cancer Neg Hx   . Rectal cancer Neg Hx   . Pancreatic cancer Neg Hx      Prior to Admission medications   Medication Sig Start Date End Date Taking? Authorizing Provider  Ascorbic Acid (VITAMIN C) 1000 MG tablet Take 1,000 mg by mouth daily.     Yes [provider]  oxyCODONE-acetaminophen (PERCOCET) 10-325 MG per tablet Take 1 tablet by mouth every 4 (four) hours as needed for pain. 10/09/14   Daryll Brod, MD    Physical Exam: Vitals:   09/24/17 0015 09/24/17 0030 09/24/17 0100 09/24/17 0130  BP: (!) 142/90 (!) 143/81 (!) 155/96 (!) 147/80  Pulse: 100 Marland Kitchen)  102 (!) 103 94  Resp: 15 (!) 21 (!) 21 19  Temp:      TempSrc:      SpO2: 97% 91% 90% 95%  Weight:      Height:          Constitutional: NAD, calm, in obvious discomfort Eyes: PERTLA, lids and conjunctivae normal ENMT: Mucous membranes are moist. Posterior pharynx clear of any exudate or lesions.   Neck: normal, supple, no masses, no thyromegaly Respiratory: clear to auscultation bilaterally, no wheezing, no crackles. Normal respiratory effort.   Cardiovascular: S1 & S2 heard, regular rate and  rhythm. No extremity edema. No significant JVD. Abdomen: Distended, generally tender, no rebound pain or guarding. High-pitch tinkling bowel sounds.  Musculoskeletal: no clubbing / cyanosis. No joint deformity upper and lower extremities.   Skin: no significant rashes, lesions, ulcers. Warm, dry, well-perfused. Neurologic: CN 2-12 grossly intact. Sensation intact, DTR normal. Strength 5/5 in all 4 limbs.  Psychiatric: Alert and oriented x 3. Calm, cooperative.     Labs on Admission: I have personally reviewed following labs and imaging studies  CBC:  Recent Labs Lab 09/23/17 2359  WBC 13.6*  NEUTROABS 9.6*  HGB 16.0  HCT 45.8  MCV 87.6  PLT 681   Basic Metabolic Panel:  Recent Labs Lab 09/23/17 2359  NA 138  K 3.8  CL 103  CO2 24  GLUCOSE 138*  BUN 14  CREATININE 0.94  CALCIUM 9.6   GFR: Estimated Creatinine Clearance: 97.9 mL/min (by C-G formula based on SCr of 0.94 mg/dL). Liver Function Tests:  Recent Labs Lab 09/23/17 2359  AST 23  ALT 22  ALKPHOS 92  BILITOT 0.5  PROT 7.4  ALBUMIN 4.5    Recent Labs Lab 09/23/17 2359  LIPASE 29   No results for input(s): AMMONIA in the last 168 hours. Coagulation Profile: No results for input(s): INR, PROTIME in the last 168 hours. Cardiac Enzymes: No results for input(s): CKTOTAL, CKMB, CKMBINDEX, TROPONINI in the last 168 hours. BNP (last 3 results) No results for input(s): PROBNP in the last 8760 hours. HbA1C: No results for input(s): HGBA1C in the last 72 hours. CBG: No results for input(s): GLUCAP in the last 168 hours. Lipid Profile: No results for input(s): CHOL, HDL, LDLCALC, TRIG, CHOLHDL, LDLDIRECT in the last 72 hours. Thyroid Function Tests: No results for input(s): TSH, T4TOTAL, FREET4, T3FREE, THYROIDAB in the last 72 hours. Anemia Panel: No results for input(s): VITAMINB12, FOLATE, FERRITIN, TIBC, IRON, RETICCTPCT in the last 72 hours. Urine analysis: No results found for: COLORURINE,  APPEARANCEUR, LABSPEC, PHURINE, GLUCOSEU, HGBUR, BILIRUBINUR, KETONESUR, PROTEINUR, UROBILINOGEN, NITRITE, LEUKOCYTESUR Sepsis Labs: @LABRCNTIP (procalcitonin:4,lacticidven:4) )No results found for this or any previous visit (from the past 240 hour(s)).   Radiological Exams on Admission: Ct Abdomen Pelvis W Contrast  Result Date: 09/24/2017 CLINICAL DATA:  56 year old male with abdominal pain. EXAM: CT ABDOMEN AND PELVIS WITH CONTRAST TECHNIQUE: Multidetector CT imaging of the abdomen and pelvis was performed using the standard protocol following bolus administration of intravenous contrast. CONTRAST:  121mL ISOVUE-300 IOPAMIDOL (ISOVUE-300) INJECTION 61% COMPARISON:  Abdominal CT dated 12/30/2006 FINDINGS: Lower chest: Left lung base linear atelectasis/ scarring. The visualized lung bases are otherwise clear. There is apparent overall decrease in the left lung volume with mild shift of the mediastinum into the left hemithorax which may be related to pectus excavatum deformity. No intra-abdominal free air or free fluid. Hepatobiliary: No focal liver abnormality is seen. No gallstones, gallbladder wall thickening, or biliary dilatation. Pancreas: Unremarkable.  No pancreatic ductal dilatation or surrounding inflammatory changes. Spleen: Normal in size without focal abnormality. Adrenals/Urinary Tract: The adrenal glands are unremarkable. There is no hydronephrosis on either side. Subcentimeter left renal upper pole hypodense lesion is too small to characterize but likely represents a cyst. The kidneys are otherwise unremarkable. There is symmetric enhancement and excretion of contrast by both kidneys. The visualized ureters are unremarkable. The urinary bladder is distended. Stomach/Bowel: The stomach is distended with oral content. There is no evidence of gastric outlet obstruction. There is a small right paraumbilical hernia with partial herniation of a short segment of small bowel. In addition there is  abutment of multiple loops of small bowel to the anterior peritoneal wall along the incisional scar at the level of the umbilicus and slightly superior to the umbilicus compatible with adhesions. There is a small-bowel obstruction with transition in the anterior mid abdomen at the level of the adhesion (series 2 image 51). No definite obstruction associated with the umbilical hernia. There is dilatation of small-bowel loops proximal to the point of obstruction measuring up to 3 cm. The distal small bowel loops and the colon are collapsed. The appendix is unremarkable. Vascular/Lymphatic: There is moderate aortoiliac atherosclerotic disease. The IVC is grossly unremarkable. No portal venous gas. There is no adenopathy. Reproductive: The prostate and seminal vesicles are grossly unremarkable. No pelvic mass. Other: Small fat containing bilateral inguinal hernias. Midline vertical anterior abdominal wall incisional scar. Musculoskeletal: Degenerative changes of the spine with disc desiccation at L5-S1. Grade 1 L5-S1 retrolisthesis. No acute osseous pathology. IMPRESSION: 1. Small-bowel obstruction with transition in the anterior mid abdomen just above the umbilicus secondary to adhesions. A small umbilical hernia with focal partial herniation of small bowel noted. No definite evidence of obstruction at the level of the hernia. 2. Aortic Atherosclerosis (ICD10-I70.0). Electronically Signed   By: Anner Crete M.D.   On: 09/24/2017 01:50    EKG: Independently reviewed. Normal sinus rhythm.   Assessment/Plan  1. SBO  - Pt presents with severe abdominal pain with nausea and distension  - CT reveals SBO with transition point in anterior mid-abdomen and secondary to adhesions  - Surgery consulted by ED physician and much appreciated   - Plan to continue bowel rest, NGT decompression, IVF hydration, antiemetics, analgesia, serial exams    DVT prophylaxis: Lovenox Code Status: Full  Family Communication:  Discussed with patient Disposition Plan: Admit to med-surg Consults called: Surgery Admission status: Inpatient    Vianne Bulls, MD [facility] Pager [phone]  If 7PM-7AM, please contact night-coverage [email or web address] Password [email or web address]  09/24/2017, 2:59 AM

## 2017-09-25 LAB — BASIC METABOLIC PANEL
ANION GAP: 11 (ref 5–15)
BUN: 12 mg/dL (ref 6–20)
CALCIUM: 8.8 mg/dL — AB (ref 8.9–10.3)
CHLORIDE: 106 mmol/L (ref 101–111)
CO2: 23 mmol/L (ref 22–32)
Creatinine, Ser: 0.92 mg/dL (ref 0.61–1.24)
GFR calc Af Amer: >60 mL/min (ref >60)
Glucose, Bld: 108 mg/dL — ABNORMAL HIGH (ref 65–99)
POTASSIUM: 3.7 mmol/L (ref 3.5–5.1)
Sodium: 140 mmol/L (ref 135–145)

## 2017-09-25 LAB — GLUCOSE, CAPILLARY: Glucose-Capillary: 105 mg/dL — ABNORMAL HIGH (ref 65–99)

## 2017-09-25 LAB — HIV ANTIBODY (ROUTINE TESTING W REFLEX): HIV SCREEN 4TH GENERATION: NONREACTIVE

## 2017-09-25 MED ORDER — DIPHENHYDRAMINE HCL 50 MG/ML IJ SOLN
25.0000 mg | Freq: Four times a day (QID) | INTRAMUSCULAR | Status: DC | PRN
  Administered 2017-09-25: 25 mg via INTRAVENOUS
  Filled 2017-09-25: qty 1

## 2017-09-25 MED ORDER — POLYETHYLENE GLYCOL 3350 17 G PO PACK
17.0000 g | PACK | Freq: Two times a day (BID) | ORAL | Status: DC
  Administered 2017-09-25 – 2017-09-26 (×3): 17 g via ORAL
  Filled 2017-09-25 (×3): qty 1

## 2017-09-25 NOTE — Progress Notes (Signed)
Pt ambulated approximately 2000 ft. Pt denied any pain or discomfort during ambulation. Will continue to ambulate patient during the shift.

## 2017-09-25 NOTE — Progress Notes (Signed)
Subjective: Patient passing flatus.  Complains of sinus congestion.  Denies any abdominal pain.  Objective: Vital signs in last 24 hours: Temp:  [97.5 F (36.4 C)-98.3 F (36.8 C)] 98 F (36.7 C) (11/03 0427) Pulse Rate:  [74-97] 74 (11/03 0427) Resp:  [16-20] 20 (11/02 2219) BP: (134-157)/(88-95) 150/91 (11/03 0427) SpO2:  [96 %-98 %] 96 % (11/03 0427) Weight:  [217 lb 13 oz (98.8 kg)] 217 lb 13 oz (98.8 kg) (11/03 0541) Last BM Date: 09/23/17  Intake/Output from previous day: 11/02 0701 - 11/03 0700 In: 1010.7 [I.V.:960.7; IV Piggyback:50] Out: 1900 [Urine:1100; Emesis/NG output:800] Intake/Output this shift: No intake/output data recorded.  General appearance: alert, cooperative and no distress GI: soft, non-tender; bowel sounds normal; no masses,  no organomegaly  Lab Results:   Recent Labs  09/23/17 2359 09/24/17 0501  WBC 13.6* 12.7*  HGB 16.0 15.7  HCT 45.8 46.3  PLT 209 209   BMET  Recent Labs  09/24/17 0501 09/25/17 0732  NA 141 140  K 4.3 3.7  CL 104 106  CO2 28 23  GLUCOSE 128* 108*  BUN 12 12  CREATININE 0.94 0.92  CALCIUM 9.7 8.8*   PT/INR No results for input(s): LABPROT, INR in the last 72 hours.  Studies/Results: Dg Abdomen 1 View  Result Date: 09/24/2017 CLINICAL DATA:  56 year old male with NG tube placement. EXAM: ABDOMEN - 1 VIEW COMPARISON:  Abdominal CT dated 11/24/2016 FINDINGS: Partially visualized enteric tube with tip at the level of the diaphragm and side-port located in the distal third of the esophagus. Recommend further advancing the tube into the stomach. Air distended stomach. There is atelectatic changes of the lung bases. IMPRESSION: Enteric tube in the distal esophagus. Recommend further advancing the tube into the stomach. Electronically Signed   By: Anner Crete M.D.   On: 09/24/2017 03:37   Ct Abdomen Pelvis W Contrast  Result Date: 09/24/2017 CLINICAL DATA:  56 year old male with abdominal pain. EXAM: CT  ABDOMEN AND PELVIS WITH CONTRAST TECHNIQUE: Multidetector CT imaging of the abdomen and pelvis was performed using the standard protocol following bolus administration of intravenous contrast. CONTRAST:  161mL ISOVUE-300 IOPAMIDOL (ISOVUE-300) INJECTION 61% COMPARISON:  Abdominal CT dated 12/30/2006 FINDINGS: Lower chest: Left lung base linear atelectasis/ scarring. The visualized lung bases are otherwise clear. There is apparent overall decrease in the left lung volume with mild shift of the mediastinum into the left hemithorax which may be related to pectus excavatum deformity. No intra-abdominal free air or free fluid. Hepatobiliary: No focal liver abnormality is seen. No gallstones, gallbladder wall thickening, or biliary dilatation. Pancreas: Unremarkable. No pancreatic ductal dilatation or surrounding inflammatory changes. Spleen: Normal in size without focal abnormality. Adrenals/Urinary Tract: The adrenal glands are unremarkable. There is no hydronephrosis on either side. Subcentimeter left renal upper pole hypodense lesion is too small to characterize but likely represents a cyst. The kidneys are otherwise unremarkable. There is symmetric enhancement and excretion of contrast by both kidneys. The visualized ureters are unremarkable. The urinary bladder is distended. Stomach/Bowel: The stomach is distended with oral content. There is no evidence of gastric outlet obstruction. There is a small right paraumbilical hernia with partial herniation of a short segment of small bowel. In addition there is abutment of multiple loops of small bowel to the anterior peritoneal wall along the incisional scar at the level of the umbilicus and slightly superior to the umbilicus compatible with adhesions. There is a small-bowel obstruction with transition in the anterior mid abdomen at the  level of the adhesion (series 2 image 51). No definite obstruction associated with the umbilical hernia. There is dilatation of  small-bowel loops proximal to the point of obstruction measuring up to 3 cm. The distal small bowel loops and the colon are collapsed. The appendix is unremarkable. Vascular/Lymphatic: There is moderate aortoiliac atherosclerotic disease. The IVC is grossly unremarkable. No portal venous gas. There is no adenopathy. Reproductive: The prostate and seminal vesicles are grossly unremarkable. No pelvic mass. Other: Small fat containing bilateral inguinal hernias. Midline vertical anterior abdominal wall incisional scar. Musculoskeletal: Degenerative changes of the spine with disc desiccation at L5-S1. Grade 1 L5-S1 retrolisthesis. No acute osseous pathology. IMPRESSION: 1. Small-bowel obstruction with transition in the anterior mid abdomen just above the umbilicus secondary to adhesions. A small umbilical hernia with focal partial herniation of small bowel noted. No definite evidence of obstruction at the level of the hernia. 2. Aortic Atherosclerosis (ICD10-I70.0). Electronically Signed   By: Anner Crete M.D.   On: 09/24/2017 01:50    Anti-infectives: Anti-infectives    None      Assessment/Plan: Impression: Small bowel obstruction, resolving Plan: Have removed NG tube.  We will advance diet as tolerated.  LOS: 1 day    Aviva Signs 09/25/2017

## 2017-09-25 NOTE — Progress Notes (Signed)
Pt complaining of pain in head and throat and rates pain as 9/10. Dilaudid 1 mg administered to patient. Pt also complains of allergy symptoms MD E-Paged and received verbal order for Benadryl 25 mg q6hr PRN.

## 2017-09-25 NOTE — Progress Notes (Signed)
PROGRESS NOTE    [name]  IOX:[id number] DOB: [birth year] DOA: 09/23/2017 PCP: Cory Munch, PA-C    Assessment & Plan:   Principal Problem:   SBO (small bowel obstruction) ([place])   1.  Small bowel obstruction.  Felt to be secondary to adhesive disease.  Patient was treated conservatively with bowel rest, NG tube decompression.  His overall symptoms are improving.  He is now passing flatus, vomiting has resolved.  NG tube is now been removed and he is tolerating liquids.  We will continue to advance diet as tolerated.  Patient encouraged to ambulate.  He has been adequately hydrated with IV fluids.  Anticipate discharge home in the next 24 hours if he continues to improve.   DVT prophylaxis: Lovenox Code Status: Full code Family Communication: No family present Disposition Plan: Discharge home once improved   Consultants:   General surgery  Procedures:     Antimicrobials:       Subjective: Feeling better today.  NG tube was removed this morning.  Tolerating liquids.  No vomiting.  Passing flatus.  Objective: Vitals:   09/24/17 2219 09/25/17 0427 09/25/17 0541 09/25/17 1433  BP: (!) 157/95 (!) 150/91  135/76  Pulse: 97 74  84  Resp: 20     Temp: (!) 97.5 F (36.4 C) 98 F (36.7 C)  98.5 F (36.9 C)  TempSrc: Oral Oral  Oral  SpO2: 97% 96%  98%  Weight:   98.8 kg (217 lb 13 oz)   Height:        Intake/Output Summary (Last 24 hours) at 09/25/17 1647 Last data filed at 09/25/17 1200  Gross per 24 hour  Intake              240 ml  Output             1500 ml  Net            -1260 ml   Filed Weights   09/23/17 2337 09/24/17 0454 09/25/17 0541  Weight: 91.2 kg (201 lb) 93.1 kg (205 lb 4 oz) 98.8 kg (217 lb 13 oz)    Examination:  General exam: Appears calm and comfortable  Respiratory system: Clear to auscultation. Respiratory effort normal. Cardiovascular system: S1 & S2 heard, RRR. No JVD, murmurs, rubs, gallops or clicks. No pedal  edema. Gastrointestinal system: Abdomen is nondistended, soft and nontender. No organomegaly or masses felt. Normal bowel sounds heard. Central nervous system: Alert and oriented. No focal neurological deficits. Extremities: Symmetric 5 x 5 power. Skin: No rashes, lesions or ulcers Psychiatry: Judgement and insight appear normal. Mood & affect appropriate.     Data Reviewed: I have personally reviewed following labs and imaging studies  CBC:  Recent Labs Lab 09/23/17 2359 09/24/17 0501  WBC 13.6* 12.7*  NEUTROABS 9.6*  --   HGB 16.0 15.7  HCT 45.8 46.3  MCV 87.6 89.0  PLT 209 268   Basic Metabolic Panel:  Recent Labs Lab 09/23/17 2359 09/24/17 0501 09/25/17 0732  NA 138 141 140  K 3.8 4.3 3.7  CL 103 104 106  CO2 24 28 23   GLUCOSE 138* 128* 108*  BUN 14 12 12   CREATININE 0.94 0.94 0.92  CALCIUM 9.6 9.7 8.8*   GFR: Estimated Creatinine Clearance: 103.9 mL/min (by C-G formula based on SCr of 0.92 mg/dL). Liver Function Tests:  Recent Labs Lab 09/23/17 2359  AST 23  ALT 22  ALKPHOS 92  BILITOT 0.5  PROT 7.4  ALBUMIN  4.5    Recent Labs Lab 09/23/17 2359  LIPASE 29   No results for input(s): AMMONIA in the last 168 hours. Coagulation Profile: No results for input(s): INR, PROTIME in the last 168 hours. Cardiac Enzymes: No results for input(s): CKTOTAL, CKMB, CKMBINDEX, TROPONINI in the last 168 hours. BNP (last 3 results) No results for input(s): PROBNP in the last 8760 hours. HbA1C: No results for input(s): HGBA1C in the last 72 hours. CBG:  Recent Labs Lab 09/25/17 0738  GLUCAP 105*   Lipid Profile: No results for input(s): CHOL, HDL, LDLCALC, TRIG, CHOLHDL, LDLDIRECT in the last 72 hours. Thyroid Function Tests: No results for input(s): TSH, T4TOTAL, FREET4, T3FREE, THYROIDAB in the last 72 hours. Anemia Panel: No results for input(s): VITAMINB12, FOLATE, FERRITIN, TIBC, IRON, RETICCTPCT in the last 72 hours. Sepsis Labs: No results for  input(s): PROCALCITON, LATICACIDVEN in the last 168 hours.  No results found for this or any previous visit (from the past 240 hour(s)).       Radiology Studies: Dg Abdomen 1 View  Result Date: 09/24/2017 CLINICAL DATA:  56 year old male with NG tube placement. EXAM: ABDOMEN - 1 VIEW COMPARISON:  Abdominal CT dated 11/24/2016 FINDINGS: Partially visualized enteric tube with tip at the level of the diaphragm and side-port located in the distal third of the esophagus. Recommend further advancing the tube into the stomach. Air distended stomach. There is atelectatic changes of the lung bases. IMPRESSION: Enteric tube in the distal esophagus. Recommend further advancing the tube into the stomach. Electronically Signed   By: Anner Crete M.D.   On: 09/24/2017 03:37   Ct Abdomen Pelvis W Contrast  Result Date: 09/24/2017 CLINICAL DATA:  56 year old male with abdominal pain. EXAM: CT ABDOMEN AND PELVIS WITH CONTRAST TECHNIQUE: Multidetector CT imaging of the abdomen and pelvis was performed using the standard protocol following bolus administration of intravenous contrast. CONTRAST:  156mL ISOVUE-300 IOPAMIDOL (ISOVUE-300) INJECTION 61% COMPARISON:  Abdominal CT dated 12/30/2006 FINDINGS: Lower chest: Left lung base linear atelectasis/ scarring. The visualized lung bases are otherwise clear. There is apparent overall decrease in the left lung volume with mild shift of the mediastinum into the left hemithorax which may be related to pectus excavatum deformity. No intra-abdominal free air or free fluid. Hepatobiliary: No focal liver abnormality is seen. No gallstones, gallbladder wall thickening, or biliary dilatation. Pancreas: Unremarkable. No pancreatic ductal dilatation or surrounding inflammatory changes. Spleen: Normal in size without focal abnormality. Adrenals/Urinary Tract: The adrenal glands are unremarkable. There is no hydronephrosis on either side. Subcentimeter left renal upper pole hypodense  lesion is too small to characterize but likely represents a cyst. The kidneys are otherwise unremarkable. There is symmetric enhancement and excretion of contrast by both kidneys. The visualized ureters are unremarkable. The urinary bladder is distended. Stomach/Bowel: The stomach is distended with oral content. There is no evidence of gastric outlet obstruction. There is a small right paraumbilical hernia with partial herniation of a short segment of small bowel. In addition there is abutment of multiple loops of small bowel to the anterior peritoneal wall along the incisional scar at the level of the umbilicus and slightly superior to the umbilicus compatible with adhesions. There is a small-bowel obstruction with transition in the anterior mid abdomen at the level of the adhesion (series 2 image 51). No definite obstruction associated with the umbilical hernia. There is dilatation of small-bowel loops proximal to the point of obstruction measuring up to 3 cm. The distal small bowel loops and the  colon are collapsed. The appendix is unremarkable. Vascular/Lymphatic: There is moderate aortoiliac atherosclerotic disease. The IVC is grossly unremarkable. No portal venous gas. There is no adenopathy. Reproductive: The prostate and seminal vesicles are grossly unremarkable. No pelvic mass. Other: Small fat containing bilateral inguinal hernias. Midline vertical anterior abdominal wall incisional scar. Musculoskeletal: Degenerative changes of the spine with disc desiccation at L5-S1. Grade 1 L5-S1 retrolisthesis. No acute osseous pathology. IMPRESSION: 1. Small-bowel obstruction with transition in the anterior mid abdomen just above the umbilicus secondary to adhesions. A small umbilical hernia with focal partial herniation of small bowel noted. No definite evidence of obstruction at the level of the hernia. 2. Aortic Atherosclerosis (ICD10-I70.0). Electronically Signed   By: Anner Crete M.D.   On: 09/24/2017 01:50         Scheduled Meds: . enoxaparin (LOVENOX) injection  40 mg Subcutaneous Q24H  . lidocaine  1 application Other Once  . loratadine  10 mg Oral Daily  . polyethylene glycol  17 g Oral BID   Continuous Infusions: . famotidine (PEPCID) IV Stopped (09/25/17 1154)     LOS: 1 day    Time spent: 63mins    Meshelle Holness, MD [facility] Pager [phone]  If 7PM-7AM, please contact night-coverage [email or web address] Password [facility] 09/25/2017, 4:47 PM

## 2017-09-26 ENCOUNTER — Other Ambulatory Visit: Payer: Self-pay

## 2017-09-26 LAB — GLUCOSE, CAPILLARY: Glucose-Capillary: 93 mg/dL (ref 65–99)

## 2017-09-26 NOTE — Progress Notes (Signed)
NURSING PROGRESS NOTE  [name] [id number] Discharge Data: 09/26/2017 11:06 AM Attending Provider: Kathie Dike, MD VBT:YOMA, Carlean Jews, PA-C     Wilfred Curtis Fonte to be D/C'd Home per MD order.  Discussed with the patient the After Visit Summary and all questions fully answered. All IV's discontinued with no bleeding noted. All belongings returned to patient for patient to take home.   Last Vital Signs:  Blood pressure 120/68, pulse 82, temperature 98.2 F (36.8 C), temperature source Oral, resp. rate 18, height 5\' 9"  (1.753 m), weight 93.1 kg (205 lb 3.2 oz), SpO2 98 %.  Discharge Medication List Allergies as of 09/26/2017   No Known Allergies     Medication List    TAKE these medications   vitamin C 1000 MG tablet Take 1,000 mg by mouth daily.   ZYRTEC ALLERGY 10 MG Caps Generic drug:  Cetirizine HCl Take by mouth.

## 2017-09-26 NOTE — Discharge Summary (Signed)
Physician Discharge Summary  [name]:[id number] DOB: 05-19-1961 DOA: 09/23/2017  PCP: Cory Munch, PA-C  Admit date: 09/23/2017 Discharge date: 09/26/2017  Admitted From: Home Disposition: Home  Recommendations for Outpatient Follow-up:  1. Follow up with PCP in 1-2 weeks 2. Please obtain BMP/CBC in one week 3. Follow-up with general surgery as needed  Discharge Condition: Stable CODE STATUS: Full code Diet recommendation: Heart Healthy  Brief/Interim Summary: Small bowel obstruction.  Felt to be secondary to adhesive disease.  Patient was treated conservatively with bowel rest, NG tube decompression.    Clinically, he began to improve and started passing flatus.  Vomiting resolved.  Subsequently, NG tube was removed and diet was advanced.  He began having bowel movements.  He tolerated solid diet without any difficulties.  Clinically, obstruction has resolved.  He is felt appropriate for discharge home and can follow-up with general surgery as needed.   Discharge Diagnoses:  Principal Problem:   SBO (small bowel obstruction) [facility])    Discharge Instructions  Discharge Instructions    Diet - low sodium heart healthy   Complete by:  As directed    Increase activity slowly   Complete by:  As directed      Allergies as of 09/26/2017   No Known Allergies     Medication List    TAKE these medications   vitamin C 1000 MG tablet Take 1,000 mg by mouth daily.   ZYRTEC ALLERGY 10 MG Caps Generic drug:  Cetirizine HCl Take by mouth.       No Known Allergies  Consultations:  General surgery   Procedures/Studies: Dg Abdomen 1 View  Result Date: 09/24/2017 CLINICAL DATA:  56 year old male with NG tube placement. EXAM: ABDOMEN - 1 VIEW COMPARISON:  Abdominal CT dated 11/24/2016 FINDINGS: Partially visualized enteric tube with tip at the level of the diaphragm and side-port located in the distal third of the esophagus. Recommend further advancing the tube into  the stomach. Air distended stomach. There is atelectatic changes of the lung bases. IMPRESSION: Enteric tube in the distal esophagus. Recommend further advancing the tube into the stomach. Electronically Signed   By: Anner Crete M.D.   On: 09/24/2017 03:37   Ct Abdomen Pelvis W Contrast  Result Date: 09/24/2017 CLINICAL DATA:  56 year old male with abdominal pain. EXAM: CT ABDOMEN AND PELVIS WITH CONTRAST TECHNIQUE: Multidetector CT imaging of the abdomen and pelvis was performed using the standard protocol following bolus administration of intravenous contrast. CONTRAST:  159mL ISOVUE-300 IOPAMIDOL (ISOVUE-300) INJECTION 61% COMPARISON:  Abdominal CT dated 12/30/2006 FINDINGS: Lower chest: Left lung base linear atelectasis/ scarring. The visualized lung bases are otherwise clear. There is apparent overall decrease in the left lung volume with mild shift of the mediastinum into the left hemithorax which may be related to pectus excavatum deformity. No intra-abdominal free air or free fluid. Hepatobiliary: No focal liver abnormality is seen. No gallstones, gallbladder wall thickening, or biliary dilatation. Pancreas: Unremarkable. No pancreatic ductal dilatation or surrounding inflammatory changes. Spleen: Normal in size without focal abnormality. Adrenals/Urinary Tract: The adrenal glands are unremarkable. There is no hydronephrosis on either side. Subcentimeter left renal upper pole hypodense lesion is too small to characterize but likely represents a cyst. The kidneys are otherwise unremarkable. There is symmetric enhancement and excretion of contrast by both kidneys. The visualized ureters are unremarkable. The urinary bladder is distended. Stomach/Bowel: The stomach is distended with oral content. There is no evidence of gastric outlet obstruction. There is a small right  paraumbilical hernia with partial herniation of a short segment of small bowel. In addition there is abutment of multiple loops of  small bowel to the anterior peritoneal wall along the incisional scar at the level of the umbilicus and slightly superior to the umbilicus compatible with adhesions. There is a small-bowel obstruction with transition in the anterior mid abdomen at the level of the adhesion (series 2 image 51). No definite obstruction associated with the umbilical hernia. There is dilatation of small-bowel loops proximal to the point of obstruction measuring up to 3 cm. The distal small bowel loops and the colon are collapsed. The appendix is unremarkable. Vascular/Lymphatic: There is moderate aortoiliac atherosclerotic disease. The IVC is grossly unremarkable. No portal venous gas. There is no adenopathy. Reproductive: The prostate and seminal vesicles are grossly unremarkable. No pelvic mass. Other: Small fat containing bilateral inguinal hernias. Midline vertical anterior abdominal wall incisional scar. Musculoskeletal: Degenerative changes of the spine with disc desiccation at L5-S1. Grade 1 L5-S1 retrolisthesis. No acute osseous pathology. IMPRESSION: 1. Small-bowel obstruction with transition in the anterior mid abdomen just above the umbilicus secondary to adhesions. A small umbilical hernia with focal partial herniation of small bowel noted. No definite evidence of obstruction at the level of the hernia. 2. Aortic Atherosclerosis (ICD10-I70.0). Electronically Signed   By: Anner Crete M.D.   On: 09/24/2017 01:50       Subjective: Feeling better.  Tolerating diet.  No vomiting.  Having bowel movements.  Discharge Exam: Vitals:   09/25/17 2100 09/26/17 0656  BP: 129/77 120/68  Pulse: 76 82  Resp: 18 18  Temp: 97.9 F (36.6 C) 98.2 F (36.8 C)  SpO2: 98% 98%   Vitals:   09/25/17 1433 09/25/17 2032 09/25/17 2100 09/26/17 0656  BP: 135/76  129/77 120/68  Pulse: 84  76 82  Resp:   18 18  Temp: 98.5 F (36.9 C)  97.9 F (36.6 C) 98.2 F (36.8 C)  TempSrc: Oral   Oral  SpO2: 98% 96% 98% 98%  Weight:     93.1 kg (205 lb 3.2 oz)  Height:        General: Pt is alert, awake, not in acute distress Cardiovascular: RRR, S1/S2 +, no rubs, no gallops Respiratory: CTA bilaterally, no wheezing, no rhonchi Abdominal: Soft, NT, ND, bowel sounds + Extremities: no edema, no cyanosis    The results of significant diagnostics from this hospitalization (including imaging, microbiology, ancillary and laboratory) are listed below for reference.     Microbiology: No results found for this or any previous visit (from the past 240 hour(s)).   Labs: BNP (last 3 results) No results for input(s): BNP in the last 8760 hours. Basic Metabolic Panel: Recent Labs  Lab 09/23/17 2359 09/24/17 0501 09/25/17 0732  NA 138 141 140  K 3.8 4.3 3.7  CL 103 104 106  CO2 24 28 23   GLUCOSE 138* 128* 108*  BUN 14 12 12   CREATININE 0.94 0.94 0.92  CALCIUM 9.6 9.7 8.8*   Liver Function Tests: Recent Labs  Lab 09/23/17 2359  AST 23  ALT 22  ALKPHOS 92  BILITOT 0.5  PROT 7.4  ALBUMIN 4.5   Recent Labs  Lab 09/23/17 2359  LIPASE 29   No results for input(s): AMMONIA in the last 168 hours. CBC: Recent Labs  Lab 09/23/17 2359 09/24/17 0501  WBC 13.6* 12.7*  NEUTROABS 9.6*  --   HGB 16.0 15.7  HCT 45.8 46.3  MCV 87.6 89.0  PLT 209  209   Cardiac Enzymes: No results for input(s): CKTOTAL, CKMB, CKMBINDEX, TROPONINI in the last 168 hours. BNP: Invalid input(s): POCBNP CBG: Recent Labs  Lab 09/25/17 0738 09/26/17 0733  GLUCAP 105* 93   D-Dimer No results for input(s): DDIMER in the last 72 hours. Hgb A1c No results for input(s): HGBA1C in the last 72 hours. Lipid Profile No results for input(s): CHOL, HDL, LDLCALC, TRIG, CHOLHDL, LDLDIRECT in the last 72 hours. Thyroid function studies No results for input(s): TSH, T4TOTAL, T3FREE, THYROIDAB in the last 72 hours.  Invalid input(s): FREET3 Anemia work up No results for input(s): VITAMINB12, FOLATE, FERRITIN, TIBC, IRON, RETICCTPCT  in the last 72 hours. Urinalysis No results found for: COLORURINE, APPEARANCEUR, LABSPEC, [place], GLUCOSEU, HGBUR, BILIRUBINUR, KETONESUR, PROTEINUR, UROBILINOGEN, NITRITE, LEUKOCYTESUR Sepsis Labs Invalid input(s): PROCALCITONIN,  WBC,  LACTICIDVEN Microbiology No results found for this or any previous visit (from the past 240 hour(s)).   Time coordinating discharge: Over 30 minutes  SIGNED:   Kathie Dike, MD  [facility] 09/26/2017, 3:25 PM Pager   If 7PM-7AM, please contact night-coverage [email or web address] Password [email or web address]

## 2017-09-26 NOTE — Progress Notes (Signed)
  Subjective: Patient tolerating regular diet well.  Has had multiple bowel movements.  No abdominal pain is noted.  Objective: Vital signs in last 24 hours: Temp:  [97.9 F (36.6 C)-98.5 F (36.9 C)] 98.2 F (36.8 C) (11/04 0656) Pulse Rate:  [76-84] 82 (11/04 0656) Resp:  [18] 18 (11/04 0656) BP: (120-135)/(68-77) 120/68 (11/04 0656) SpO2:  [96 %-98 %] 98 % (11/04 0656) Weight:  [205 lb 3.2 oz (93.1 kg)] 205 lb 3.2 oz (93.1 kg) (11/04 0656) Last BM Date: 09/25/17  Intake/Output from previous day: 11/03 0701 - 11/04 0700 In: 630 [P.O.:480; IV Piggyback:150] Out: 400 [Urine:400] Intake/Output this shift: No intake/output data recorded.  General appearance: alert, cooperative and no distress GI: soft, non-tender; bowel sounds normal; no masses,  no organomegaly  Lab Results:  Recent Labs    09/23/17 2359 09/24/17 0501  WBC 13.6* 12.7*  HGB 16.0 15.7  HCT 45.8 46.3  PLT 209 209   BMET Recent Labs    09/24/17 0501 09/25/17 0732  NA 141 140  K 4.3 3.7  CL 104 106  CO2 28 23  GLUCOSE 128* 108*  BUN 12 12  CREATININE 0.94 0.92  CALCIUM 9.7 8.8*   PT/INR No results for input(s): LABPROT, INR in the last 72 hours.  Studies/Results: No results found.  Anti-infectives: Anti-infectives (From admission, onward)   None      Assessment/Plan: Impression: Small bowel obstruction, resolved. Plan: Okay for discharge from surgery standpoint.  Follow-up in my office as needed.  LOS: 2 days    Aviva Signs 09/26/2017

## 2018-01-03 DIAGNOSIS — Z0001 Encounter for general adult medical examination with abnormal findings: Secondary | ICD-10-CM | POA: Diagnosis not present

## 2018-01-03 DIAGNOSIS — H6983 Other specified disorders of Eustachian tube, bilateral: Secondary | ICD-10-CM | POA: Diagnosis not present

## 2018-01-03 DIAGNOSIS — J019 Acute sinusitis, unspecified: Secondary | ICD-10-CM | POA: Diagnosis not present

## 2018-01-03 DIAGNOSIS — E663 Overweight: Secondary | ICD-10-CM | POA: Diagnosis not present

## 2018-01-03 DIAGNOSIS — Z6828 Body mass index (BMI) 28.0-28.9, adult: Secondary | ICD-10-CM | POA: Diagnosis not present

## 2018-01-03 DIAGNOSIS — Z0000 Encounter for general adult medical examination without abnormal findings: Secondary | ICD-10-CM | POA: Diagnosis not present

## 2018-01-03 DIAGNOSIS — Z1389 Encounter for screening for other disorder: Secondary | ICD-10-CM | POA: Diagnosis not present

## 2018-09-05 DIAGNOSIS — Z23 Encounter for immunization: Secondary | ICD-10-CM | POA: Diagnosis not present

## 2019-02-07 DIAGNOSIS — Z1389 Encounter for screening for other disorder: Secondary | ICD-10-CM | POA: Diagnosis not present

## 2019-02-07 DIAGNOSIS — Z6829 Body mass index (BMI) 29.0-29.9, adult: Secondary | ICD-10-CM | POA: Diagnosis not present

## 2019-02-07 DIAGNOSIS — J302 Other seasonal allergic rhinitis: Secondary | ICD-10-CM | POA: Diagnosis not present

## 2019-02-07 DIAGNOSIS — E663 Overweight: Secondary | ICD-10-CM | POA: Diagnosis not present

## 2019-02-07 DIAGNOSIS — H1032 Unspecified acute conjunctivitis, left eye: Secondary | ICD-10-CM | POA: Diagnosis not present

## 2019-02-07 DIAGNOSIS — H00014 Hordeolum externum left upper eyelid: Secondary | ICD-10-CM | POA: Diagnosis not present

## 2019-04-19 DIAGNOSIS — Z1389 Encounter for screening for other disorder: Secondary | ICD-10-CM | POA: Diagnosis not present

## 2019-04-19 DIAGNOSIS — M5030 Other cervical disc degeneration, unspecified cervical region: Secondary | ICD-10-CM | POA: Diagnosis not present

## 2019-04-19 DIAGNOSIS — E6609 Other obesity due to excess calories: Secondary | ICD-10-CM | POA: Diagnosis not present

## 2019-04-19 DIAGNOSIS — Z6830 Body mass index (BMI) 30.0-30.9, adult: Secondary | ICD-10-CM | POA: Diagnosis not present

## 2019-04-19 DIAGNOSIS — S81811A Laceration without foreign body, right lower leg, initial encounter: Secondary | ICD-10-CM | POA: Diagnosis not present

## 2019-04-19 DIAGNOSIS — B999 Unspecified infectious disease: Secondary | ICD-10-CM | POA: Diagnosis not present

## 2019-06-19 DIAGNOSIS — H903 Sensorineural hearing loss, bilateral: Secondary | ICD-10-CM | POA: Diagnosis not present

## 2019-06-29 DIAGNOSIS — H903 Sensorineural hearing loss, bilateral: Secondary | ICD-10-CM | POA: Diagnosis not present

## 2019-07-26 DIAGNOSIS — M4312 Spondylolisthesis, cervical region: Secondary | ICD-10-CM | POA: Diagnosis not present

## 2019-07-26 DIAGNOSIS — M5441 Lumbago with sciatica, right side: Secondary | ICD-10-CM | POA: Diagnosis not present

## 2019-07-26 DIAGNOSIS — M542 Cervicalgia: Secondary | ICD-10-CM | POA: Diagnosis not present

## 2019-07-26 DIAGNOSIS — M545 Low back pain: Secondary | ICD-10-CM | POA: Diagnosis not present

## 2019-07-26 DIAGNOSIS — M5442 Lumbago with sciatica, left side: Secondary | ICD-10-CM | POA: Diagnosis not present

## 2019-07-26 DIAGNOSIS — M4802 Spinal stenosis, cervical region: Secondary | ICD-10-CM | POA: Diagnosis not present

## 2019-08-23 DIAGNOSIS — M5441 Lumbago with sciatica, right side: Secondary | ICD-10-CM | POA: Diagnosis not present

## 2019-08-23 DIAGNOSIS — G8929 Other chronic pain: Secondary | ICD-10-CM | POA: Diagnosis not present

## 2019-08-23 DIAGNOSIS — M5442 Lumbago with sciatica, left side: Secondary | ICD-10-CM | POA: Diagnosis not present

## 2019-08-23 DIAGNOSIS — M5126 Other intervertebral disc displacement, lumbar region: Secondary | ICD-10-CM | POA: Diagnosis not present

## 2019-08-23 DIAGNOSIS — M5412 Radiculopathy, cervical region: Secondary | ICD-10-CM | POA: Diagnosis not present

## 2019-09-11 DIAGNOSIS — J392 Other diseases of pharynx: Secondary | ICD-10-CM | POA: Diagnosis not present

## 2019-09-11 DIAGNOSIS — Z01818 Encounter for other preprocedural examination: Secondary | ICD-10-CM | POA: Diagnosis not present

## 2019-09-11 DIAGNOSIS — M5116 Intervertebral disc disorders with radiculopathy, lumbar region: Secondary | ICD-10-CM | POA: Diagnosis not present

## 2019-09-11 DIAGNOSIS — R221 Localized swelling, mass and lump, neck: Secondary | ICD-10-CM | POA: Diagnosis not present

## 2019-09-14 ENCOUNTER — Other Ambulatory Visit: Payer: Self-pay

## 2019-09-14 ENCOUNTER — Ambulatory Visit (HOSPITAL_COMMUNITY): Payer: BC Managed Care – PPO | Attending: Physician Assistant | Admitting: Physical Therapy

## 2019-09-14 ENCOUNTER — Encounter (HOSPITAL_COMMUNITY): Payer: Self-pay | Admitting: Physical Therapy

## 2019-09-14 DIAGNOSIS — R2689 Other abnormalities of gait and mobility: Secondary | ICD-10-CM | POA: Diagnosis not present

## 2019-09-14 DIAGNOSIS — M545 Low back pain: Secondary | ICD-10-CM | POA: Insufficient documentation

## 2019-09-14 DIAGNOSIS — G8929 Other chronic pain: Secondary | ICD-10-CM | POA: Diagnosis not present

## 2019-09-14 DIAGNOSIS — M5412 Radiculopathy, cervical region: Secondary | ICD-10-CM | POA: Diagnosis not present

## 2019-09-14 DIAGNOSIS — M542 Cervicalgia: Secondary | ICD-10-CM | POA: Insufficient documentation

## 2019-09-14 NOTE — Therapy (Signed)
[facility] [place], [place], [address] Phone: [phone]   Fax:  [phone]  Physical Therapy Evaluation  Patient Details  Name: [name] MRN: [id number] Date of Birth: 10-01-1961 Referring Provider (PT): Blanche East MD   Encounter Date: 09/14/2019  PT End of Session - 09/14/19 1704    Visit Number  1    Number of Visits  8    Date for PT Re-Evaluation  10/12/19    Authorization Type  [employer] 90 visits combined PT/OT/ chiro    PT Start Time  1600    PT Stop Time  [id number]    PT Time Calculation (min)  45 min    Activity Tolerance  Patient limited by pain;Patient tolerated treatment well    Behavior During Therapy  [facility] for tasks assessed/performed       History reviewed. No pertinent past medical history.  Past Surgical History:  Procedure Laterality Date  . ANTERIOR CERVICAL DISCECTOMY  1999  . CERVICAL FUSION  2010, 2012  . [place]   right  . intestinal obstruction  2010  . OTHER SURGICAL HISTORY  1966   sternum surgery  . ribs removed as child  50  . thornwaldt cyst removed    . TONSILLECTOMY  1968  . TRANSURETHRAL RESECTION OF PROSTATE  2012  . ULNA OSTEOTOMY Right 10/09/2014   Procedure: ULNAR SHORTENING;  Surgeon: Daryll Brod, MD;  Location: [place];  Service: Orthopedics;  Laterality: Right;  . WRIST ARTHROSCOPY Right 10/09/2014   Procedure: RIGHT WRIST ARTHROSCOPY WITH ULNA SHORTENING OSTEOTOMY;  Surgeon: Daryll Brod, MD;  Location: [place];  Service: Orthopedics;  Laterality: Right;    There were no vitals filed for this visit.   Subjective Assessment - 09/14/19 1627    Subjective  Patient presents to physical therapy with complaint of neck and low back pain. Patient says neck pain began about 1 year ago. Patient says back pain began about 6 months ago. Patient states both issues began insidiously. Patient reports having 4 neck surgeries  including C2-3, 3-4, 5-6 fusions. C2-3 came apart and had revision. Patient denies lumbar surgery. Patient reports numbness and tingling in UES R>L. Patient denies numbness or tingling in BLEs, but notes occasional difficulty with lifting RLE. Patient had imaging of lumbar which showed 3 ruptured discs. Patient says he has been prescribed pain medication but he does not take it due to negative side effects.    Pertinent History  Hx of cervical fusions    Limitations  Sitting;Lifting;Standing;Walking;House hold activities    How long can you sit comfortably?  unable    How long can you stand comfortably?  10 minutes but with pain    How long can you walk comfortably?  10 minutes but with pain    Diagnostic tests  MRI    Patient Stated Goals  have a normal day without pain    Currently in Pain?  Yes    Pain Score  9     Pain Location  Back    Pain Orientation  Posterior;Lower    Pain Descriptors / Indicators  Burning    Pain Type  Chronic pain    Pain Onset  More than a month ago    Pain Frequency  Constant    Aggravating Factors   Standing, walking, bending, lifting    Pain Relieving Factors  rest    Effect of Pain on Daily Activities  Limiting    Multiple Pain Sites  Yes    Pain Score  7    Pain Location  Neck    Pain Orientation  Posterior    Pain Descriptors / Indicators  Jabbing;Burning    Pain Type  Chronic pain    Pain Radiating Towards  RT hand and fingers    Pain Onset  More than a month ago    Pain Frequency  Constant    Aggravating Factors   Lifitng, using arms, reaching overhead    Pain Relieving Factors  Rest    Effect of Pain on Daily Activities  Limiting         [facility] PT Assessment - 09/14/19 0001      Assessment   Medical Diagnosis  Neck and Low back pain     Referring Provider (PT)  Blanche East MD    Onset Date/Surgical Date  --   Neck: 1 year ago, lumbar: 6-7 months ago   Hand Dominance  Right    Prior Therapy  yes for cervical in 1999, none for lumbar       Precautions   Precautions  --   Hx of cervical fusions     Restrictions   Weight Bearing Restrictions  No      Balance Screen   Has the patient fallen in the past 6 months  No      Center residence      Prior Function   Level of Independence  Independent      Cognition   Overall Cognitive Status  Within Functional Limits for tasks assessed      ROM / Strength   AROM / PROM / Strength  AROM;Strength      AROM   AROM Assessment Site  Cervical;Lumbar    Cervical Flexion  24    Cervical Extension  20    Cervical - Right Side Bend  15    Cervical - Left Side Bend  10    Cervical - Right Rotation  40    Cervical - Left Rotation  35    Lumbar Flexion  50% limited    Lumbar Extension  90% limited    Lumbar - Right Side Bend  WFL    Lumbar - Left Side Bend  WFL    Lumbar - Right Rotation  WFL    Lumbar - Left Rotation  [facility]      Strength   Strength Assessment Site  Shoulder;Elbow;Hip;Knee;Ankle    Right/Left Shoulder  Right;Left    Right Shoulder Flexion  5/5    Right Shoulder ABduction  5/5    Left Shoulder Flexion  5/5    Left Shoulder ABduction  5/5    Right/Left Hip  Right;Left    Right Hip Flexion  4/5    Right Hip ABduction  4/5    Left Hip Flexion  5/5    Left Hip ABduction  4+/5    Right/Left Knee  Right;Left    Right Knee Flexion  5/5    Right Knee Extension  5/5    Left Knee Flexion  5/5    Left Knee Extension  5/5    Right/Left Ankle  Right;Left    Right Ankle Dorsiflexion  4+/5    Left Ankle Dorsiflexion  5/5      Palpation   Palpation comment  No noted tenderness to palpation       Transfers   Five time sit to  stand comments   18.6 seconds      Ambulation/Gait   Ambulation/Gait  Yes    Ambulation/Gait Assistance  7: Independent    Ambulation Distance (Feet)  385 Feet    Assistive device  None    Gait Pattern  Decreased stance time - right;Decreased stride length;Decreased dorsiflexion -  right;Decreased dorsiflexion - left    Ambulation Surface  Level    Gait Comments  2MWT      Balance   Balance Assessed  Yes      Static Standing Balance   Static Standing Balance -  Activities   Single Leg Stance - Right Leg;Single Leg Stance - Left Leg    Static Standing - Comment/# of Minutes  15 seconds mod sway; 14 seconds min sway                Objective measurements completed on examination: See above findings.              PT Education - 09/14/19 1703    Education Details  Patient educated on assessment findings, prognosis and POC    Person(s) Educated  Patient    Methods  Explanation    Comprehension  Verbalized understanding       PT Short Term Goals - 09/14/19 1803      PT SHORT TERM GOAL #1   Title  Patient will be IND with initial HEP to improve functional outcomes    Time  2    Period  Weeks    Status  New    Target Date  09/28/19        PT Long Term Goals - 09/14/19 1804      PT LONG TERM GOAL #1   Title  Patient will have greater than or equal to 4+/5 MMT throughout BLEs to improve functional mobility    Time  4    Period  Weeks    Status  New    Target Date  10/12/19      PT LONG TERM GOAL #2   Title  Patient will have average pain level <5/10 for 7 days in order to improve ability to perform ADLs.    Time  4    Period  Weeks    Status  New    Target Date  10/12/19      PT LONG TERM GOAL #3   Title  Patient will improve distance on 2 MWT by 75 feet in order to improve ability to safely ambulate within community    Time  4    Period  Weeks    Status  New    Target Date  10/12/19      PT LONG TERM GOAL #4   Title  Patient will improve bilateral cervical rotation by 10 degrees in order to improve ability to scan environment/ look when making turns while driving.    Time  4    Period  Weeks    Status  New    Target Date  10/12/19             Plan - 09/14/19 1757    Clinical Impression Statement  Patient is a  58 year old male who presents to physical therapy with complaint of neck and low back pain. Patient demos decreased strength, reduced ROM, sensation deficits, gait abnormalities and altered body mechanics which are likely contributing to sx of pain and are negatively impacting ability to perform ADLs. Patient will benefit from skilled physical therapy  to address these deficits to reduce pain and to improve level of function with ADLs.    Personal Factors and Comorbidities  Time since onset of injury/illness/exacerbation    Examination-Activity Limitations  Bathing;Locomotion Level;Transfers;Sit;Sleep;Carry;Bend;Squat;Dressing;Stairs;Stand;Lift    Examination-Participation Restrictions  Cleaning;Yard Work;Community Activity    Stability/Clinical Decision Making  Stable/Uncomplicated    Clinical Decision Making  Low    Rehab Potential  Fair    PT Frequency  2x / week    PT Duration  4 weeks    PT Treatment/Interventions  ADLs/Self Care Home Management;Biofeedback;Cryotherapy;Electrical Stimulation;Gait training;Neuromuscular re-education;Stair training;Functional mobility training;Moist Heat;Traction;Ultrasound;DME Instruction;Balance training;Therapeutic exercise;Therapeutic activities;Patient/family education;Orthotic Fit/Training;Passive range of motion;Taping;Manual techniques;Joint Manipulations;Energy conservation;Spinal Manipulations    PT Next Visit Plan  Initiate treatment. Complete FOTO.  Focus on cervical AROM, chin tucks, abdominal isometrics, BLE strengthening as tolerated. Initiate HEP including cervical excursions, scap retractions, chin tucks, ab sets    PT Home Exercise Plan  Initiate at first session    Consulted and Agree with Plan of Care  Patient       Patient will benefit from skilled therapeutic intervention in order to improve the following deficits and impairments:  Abnormal gait, Decreased range of motion, Difficulty walking, Decreased activity tolerance, Hypomobility,  Impaired flexibility, Improper body mechanics, Pain, Postural dysfunction, Decreased strength, Decreased mobility  Visit Diagnosis: Cervicalgia  Radiculopathy, cervical region  Chronic bilateral low back pain without sciatica  Other abnormalities of gait and mobility     Problem List Patient Active Problem List   Diagnosis Date Noted  . SBO (small bowel obstruction) ([place]) 09/24/2017    Elizbeth Squires PT DPT 09/14/2019, 6:20 PM  [facility] [address] [place], [place], [address] Phone: [phone]   Fax:  [phone]  Name: [name] MRN: [id number] Date of Birth: 04-08-1961

## 2019-09-18 ENCOUNTER — Ambulatory Visit (HOSPITAL_COMMUNITY): Payer: BC Managed Care – PPO | Admitting: Physical Therapy

## 2019-09-18 ENCOUNTER — Encounter (HOSPITAL_COMMUNITY): Payer: No Typology Code available for payment source

## 2019-09-18 ENCOUNTER — Telehealth (HOSPITAL_COMMUNITY): Payer: Self-pay

## 2019-09-18 ENCOUNTER — Other Ambulatory Visit: Payer: Self-pay

## 2019-09-18 DIAGNOSIS — M545 Low back pain: Secondary | ICD-10-CM

## 2019-09-18 DIAGNOSIS — G8929 Other chronic pain: Secondary | ICD-10-CM | POA: Diagnosis not present

## 2019-09-18 DIAGNOSIS — R2689 Other abnormalities of gait and mobility: Secondary | ICD-10-CM

## 2019-09-18 DIAGNOSIS — M542 Cervicalgia: Secondary | ICD-10-CM | POA: Diagnosis not present

## 2019-09-18 DIAGNOSIS — M5412 Radiculopathy, cervical region: Secondary | ICD-10-CM

## 2019-09-18 NOTE — Patient Instructions (Addendum)
Isometric Abdominal    Lying on back with knees bent, tighten stomach by pressing elbows down. Hold _5___ seconds. Repeat 10____ times per set. Do ___1_ sets per session. Do ___2_ sessions per day. You may have to lift your head up off the pillow to begin with. http://orth.exer.us/1086   Copyright  VHI. All rights reserved.  Scapular Retraction (Standing)    With arms at sides, pinch shoulder blades together. Repeat __10__ times per set. Do _1___ sets per session. Do ___2_ sessions per day.  http://orth.exer.us/944   Copyright  VHI. All rights reserved.

## 2019-09-18 NOTE — Telephone Encounter (Signed)
had to cancel today's appt due to the provider is out of the ofice with a sick child.

## 2019-09-18 NOTE — Therapy (Signed)
[place], [place], [address] Phone: [phone]   Fax:  [phone]  Physical Therapy Treatment  Patient Details  Name: [name] MRN: [id number] Date of Birth: 10-14-1961 Referring Provider (PT): Blanche East MD   Encounter Date: 09/18/2019  PT End of Session - 09/18/19 1225    Visit Number  2    Number of Visits  8    Date for PT Re-Evaluation  10/12/19    Authorization Type  [employer] 90 visits combined PT/OT/ chiro    Authorization - Visit Number  2    Authorization - Number of Visits  90    PT Start Time  [id number]    PT Stop Time  1220    PT Time Calculation (min)  45 min    Activity Tolerance  Patient limited by pain;Patient tolerated treatment well    Behavior During Therapy  [facility] for tasks assessed/performed       No past medical history on file.  Past Surgical History:  Procedure Laterality Date  . ANTERIOR CERVICAL DISCECTOMY  1999  . CERVICAL FUSION  2010, 2012  . [place]   right  . intestinal obstruction  2010  . OTHER SURGICAL HISTORY  1966   sternum surgery  . ribs removed as child  23  . thornwaldt cyst removed    . TONSILLECTOMY  1968  . TRANSURETHRAL RESECTION OF PROSTATE  2012  . ULNA OSTEOTOMY Right 10/09/2014   Procedure: ULNAR SHORTENING;  Surgeon: Daryll Brod, MD;  Location: [place];  Service: Orthopedics;  Laterality: Right;  . WRIST ARTHROSCOPY Right 10/09/2014   Procedure: RIGHT WRIST ARTHROSCOPY WITH ULNA SHORTENING OSTEOTOMY;  Surgeon: Daryll Brod, MD;  Location: [place];  Service: Orthopedics;  Laterality: Right;    There were no vitals filed for this visit.  Subjective Assessment - 09/18/19 1147    Subjective  Pt states that his low back is bothering him more than his neck.  He has a headache right now and he is having LT leg pain.  Marland Kitchen    Pertinent History  Hx of cervical fusions    Limitations   Sitting;Lifting;Standing;Walking;House hold activities    How long can you sit comfortably?  unable    How long can you stand comfortably?  10 minutes but with pain    How long can you walk comfortably?  10 minutes but with pain    Diagnostic tests  MRI    Patient Stated Goals  have a normal day without pain    Currently in Pain?  Yes    Pain Score  7     Pain Location  Neck    Pain Orientation  Upper    Pain Descriptors / Indicators  Aching;Headache    Pain Type  Chronic pain    Pain Radiating Towards  Lt arm to fingers    Pain Onset  More than a month ago    Pain Frequency  Constant    Aggravating Factors   not sure    Pain Relieving Factors  not sure    Effect of Pain on Daily Activities  limits    Pain Score  9    Pain Location  Back    Pain Orientation  Lower    Pain Descriptors / Indicators  Aching    Pain Type  Chronic pain    Pain Radiating Towards  LT leg    Pain  Onset  More than a month ago    Pain Frequency  Constant    Aggravating Factors   not sure         [facility] PT Assessment - 09/18/19 0001      Observation/Other Assessments   Focus on Therapeutic Outcomes (FOTO)   44                   [facility] Adult PT Treatment/Exercise - 09/18/19 0001      Exercises   Exercises  Neck;Lumbar      Neck Exercises: Standing   Other Standing Exercises  lumbar excursions       Neck Exercises: Seated   W Back  5 reps    Shoulder Rolls  5 reps;Backwards    Other Seated Exercise  cervical and thoracic excursions       Lumbar Exercises: Supine   Ab Set  10 reps      Manual Therapy   Manual Therapy  Soft tissue mobilization    Manual therapy comments  done seperate from all other aspect of treatment.    Soft tissue mobilization  suboccipital release              PT Education - 09/18/19 1225    Education Details  HEP    Person(s) Educated  Patient    Methods  Explanation;Demonstration;Tactile cues;Verbal cues;Handout    Comprehension  Verbalized  understanding;Returned demonstration       PT Short Term Goals - 09/18/19 1231      PT SHORT TERM GOAL #1   Title  Patient will be IND with initial HEP to improve functional outcomes    Time  2    Period  Weeks    Status  On-going    Target Date  09/28/19        PT Long Term Goals - 09/18/19 1231      PT LONG TERM GOAL #1   Title  Patient will have greater than or equal to 4+/5 MMT throughout BLEs to improve functional mobility    Time  4    Period  Weeks    Status  On-going      PT LONG TERM GOAL #2   Title  Patient will have average pain level <5/10 for 7 days in order to improve ability to perform ADLs.    Time  4    Period  Weeks    Status  On-going      PT LONG TERM GOAL #3   Title  Patient will improve distance on 2 MWT by 75 feet in order to improve ability to safely ambulate within community    Time  4    Period  Weeks    Status  On-going      PT LONG TERM GOAL #4   Title  Patient will improve bilateral cervical rotation by 10 degrees in order to improve ability to scan environment/ look when making turns while driving.    Time  4    Period  Weeks    Status  On-going            Plan - 09/18/19 1226    Clinical Impression Statement  evaluation and goals reviewed with pt.  PT completed foto.  Therapist instructed pt in excursion exercises to promote improved mobility.  Pt had difficulty with ab set therefore therapist had pt lift his head off mat and instructed to complete this way to ensure proprer activation of mm.  Suboccipital  release techniques used to attempt to decrease headache.    Personal Factors and Comorbidities  Time since onset of injury/illness/exacerbation    Examination-Activity Limitations  Bathing;Locomotion Level;Transfers;Sit;Sleep;Carry;Bend;Squat;Dressing;Stairs;Stand;Lift    Examination-Participation Restrictions  Cleaning;Yard Work;Community Activity    Stability/Clinical Decision Making  Stable/Uncomplicated    Rehab Potential   Fair    PT Frequency  2x / week    PT Duration  4 weeks    PT Treatment/Interventions  ADLs/Self Care Home Management;Biofeedback;Cryotherapy;Electrical Stimulation;Gait training;Neuromuscular re-education;Stair training;Functional mobility training;Moist Heat;Traction;Ultrasound;DME Instruction;Balance training;Therapeutic exercise;Therapeutic activities;Patient/family education;Orthotic Fit/Training;Passive range of motion;Taping;Manual techniques;Joint Manipulations;Energy conservation;Spinal Manipulations    PT Next Visit Plan  .  Focus on cervical AROM, chin tucks, , BLE strengthening as tolerated.    PT Home Exercise Plan  cervical,thoracic and lumbar excursions, ab set    Consulted and Agree with Plan of Care  Patient       Patient will benefit from skilled therapeutic intervention in order to improve the following deficits and impairments:  Abnormal gait, Decreased range of motion, Difficulty walking, Decreased activity tolerance, Hypomobility, Impaired flexibility, Improper body mechanics, Pain, Postural dysfunction, Decreased strength, Decreased mobility  Visit Diagnosis: Cervicalgia  Radiculopathy, cervical region  Chronic bilateral low back pain without sciatica  Other abnormalities of gait and mobility     Problem List Patient Active Problem List   Diagnosis Date Noted  . SBO (small bowel obstruction) [facility]) 09/24/2017    Rayetta Humphrey, PT CLT [phone] 09/18/2019, 12:33 PM  [facility] [address] [place], [place], [address] Phone: [phone]   Fax:  [phone]  Name: [name] MRN: [id number] Date of Birth: 1961-10-24

## 2019-09-20 ENCOUNTER — Encounter (HOSPITAL_COMMUNITY): Payer: No Typology Code available for payment source | Admitting: Physical Therapy

## 2019-09-20 ENCOUNTER — Telehealth (HOSPITAL_COMMUNITY): Payer: Self-pay | Admitting: Physical Therapy

## 2019-09-20 NOTE — Telephone Encounter (Signed)
pt called to cx today's appt due to he has a headache.

## 2019-09-24 DIAGNOSIS — Z20828 Contact with and (suspected) exposure to other viral communicable diseases: Secondary | ICD-10-CM | POA: Diagnosis not present

## 2019-09-26 ENCOUNTER — Telehealth (HOSPITAL_COMMUNITY): Payer: Self-pay

## 2019-09-26 ENCOUNTER — Encounter (HOSPITAL_COMMUNITY): Payer: No Typology Code available for payment source

## 2019-09-26 DIAGNOSIS — M5126 Other intervertebral disc displacement, lumbar region: Secondary | ICD-10-CM | POA: Diagnosis not present

## 2019-09-26 DIAGNOSIS — Z885 Allergy status to narcotic agent status: Secondary | ICD-10-CM | POA: Diagnosis not present

## 2019-09-26 DIAGNOSIS — M5020 Other cervical disc displacement, unspecified cervical region: Secondary | ICD-10-CM | POA: Diagnosis not present

## 2019-09-26 DIAGNOSIS — M47812 Spondylosis without myelopathy or radiculopathy, cervical region: Secondary | ICD-10-CM | POA: Diagnosis not present

## 2019-09-26 DIAGNOSIS — M5021 Other cervical disc displacement,  high cervical region: Secondary | ICD-10-CM | POA: Diagnosis not present

## 2019-09-26 DIAGNOSIS — Z79899 Other long term (current) drug therapy: Secondary | ICD-10-CM | POA: Diagnosis not present

## 2019-09-26 DIAGNOSIS — R339 Retention of urine, unspecified: Secondary | ICD-10-CM | POA: Diagnosis not present

## 2019-09-26 DIAGNOSIS — M4312 Spondylolisthesis, cervical region: Secondary | ICD-10-CM | POA: Diagnosis not present

## 2019-09-26 DIAGNOSIS — Z981 Arthrodesis status: Secondary | ICD-10-CM | POA: Diagnosis not present

## 2019-09-26 DIAGNOSIS — M5001 Cervical disc disorder with myelopathy,  high cervical region: Secondary | ICD-10-CM | POA: Diagnosis not present

## 2019-09-26 DIAGNOSIS — R609 Edema, unspecified: Secondary | ICD-10-CM | POA: Diagnosis not present

## 2019-09-26 DIAGNOSIS — J302 Other seasonal allergic rhinitis: Secondary | ICD-10-CM | POA: Diagnosis not present

## 2019-09-26 DIAGNOSIS — M4802 Spinal stenosis, cervical region: Secondary | ICD-10-CM | POA: Diagnosis not present

## 2019-09-26 DIAGNOSIS — M62838 Other muscle spasm: Secondary | ICD-10-CM | POA: Diagnosis not present

## 2019-09-26 NOTE — Telephone Encounter (Signed)
Pt l/m @ 6:17am this morning that he will have surgery today on his neck and his wife will call us back to let un know what to do

## 2019-09-26 NOTE — Telephone Encounter (Signed)
No Show#1. Therapist called pt regarding missed appointment this morning at 9. Unable to reach pt so left voicemail regarding next appointment time and left clinic phone number for pt to call if unable to make it.  Talbot Grumbling PT, DPT 09/26/19, 9:26 AM [phone]

## 2019-09-28 ENCOUNTER — Encounter (HOSPITAL_COMMUNITY): Payer: No Typology Code available for payment source

## 2019-10-03 ENCOUNTER — Encounter (HOSPITAL_COMMUNITY): Payer: No Typology Code available for payment source | Admitting: Physical Therapy

## 2019-10-05 ENCOUNTER — Encounter (HOSPITAL_COMMUNITY): Payer: No Typology Code available for payment source | Admitting: Physical Therapy

## 2019-10-06 DIAGNOSIS — N320 Bladder-neck obstruction: Secondary | ICD-10-CM | POA: Diagnosis not present

## 2019-10-06 DIAGNOSIS — Z466 Encounter for fitting and adjustment of urinary device: Secondary | ICD-10-CM | POA: Diagnosis not present

## 2019-10-06 DIAGNOSIS — R339 Retention of urine, unspecified: Secondary | ICD-10-CM | POA: Diagnosis not present

## 2019-10-10 ENCOUNTER — Encounter (HOSPITAL_COMMUNITY): Payer: Self-pay | Admitting: Physical Therapy

## 2019-10-10 ENCOUNTER — Other Ambulatory Visit: Payer: Self-pay

## 2019-10-10 ENCOUNTER — Ambulatory Visit (HOSPITAL_COMMUNITY): Payer: BC Managed Care – PPO | Attending: Physician Assistant | Admitting: Physical Therapy

## 2019-10-10 DIAGNOSIS — G8929 Other chronic pain: Secondary | ICD-10-CM | POA: Diagnosis not present

## 2019-10-10 DIAGNOSIS — M545 Low back pain: Secondary | ICD-10-CM | POA: Insufficient documentation

## 2019-10-10 DIAGNOSIS — R2689 Other abnormalities of gait and mobility: Secondary | ICD-10-CM | POA: Diagnosis not present

## 2019-10-10 NOTE — Therapy (Signed)
[place], [place], [address] Phone: [phone]   Fax:  [phone]  Physical Therapy Treatment  Patient Details  Name: [name] MRN: [id number] Date of Birth: 07/17/1961 Referring Provider (PT): Blanche East MD   Encounter Date: 10/10/2019  PT End of Session - 10/10/19 1550    Visit Number  3    Number of Visits  8    Date for PT Re-Evaluation  10/12/19    Authorization Type  [employer] 90 visits combined PT/OT/ chiro    Authorization - Visit Number  3    Authorization - Number of Visits  90    PT Start Time  1525    PT Stop Time  1610    PT Time Calculation (min)  45 min    Activity Tolerance  Patient limited by pain;Patient tolerated treatment well    Behavior During Therapy  [facility] for tasks assessed/performed       History reviewed. No pertinent past medical history.  Past Surgical History:  Procedure Laterality Date  . ANTERIOR CERVICAL DISCECTOMY  1999  . CERVICAL FUSION  2010, 2012  . [place]   right  . intestinal obstruction  2010  . OTHER SURGICAL HISTORY  1966   sternum surgery  . ribs removed as child  89  . thornwaldt cyst removed    . TONSILLECTOMY  1968  . TRANSURETHRAL RESECTION OF PROSTATE  2012  . ULNA OSTEOTOMY Right 10/09/2014   Procedure: ULNAR SHORTENING;  Surgeon: Daryll Brod, MD;  Location: [place];  Service: Orthopedics;  Laterality: Right;  . WRIST ARTHROSCOPY Right 10/09/2014   Procedure: RIGHT WRIST ARTHROSCOPY WITH ULNA SHORTENING OSTEOTOMY;  Surgeon: Daryll Brod, MD;  Location: [place];  Service: Orthopedics;  Laterality: Right;    There were no vitals filed for this visit.  Subjective Assessment - 10/10/19 1528    Subjective  Patient reported aching pain in back. Reported surgery on 11/3 for cervical spine. Would like to focus on back and discontinue therapy for cervical at this time.    Pertinent History  Hx of  cervical fusions    Limitations  Sitting;Lifting;Standing;Walking;House hold activities    How long can you sit comfortably?  unable    How long can you stand comfortably?  10 minutes but with pain    How long can you walk comfortably?  10 minutes but with pain    Diagnostic tests  MRI    Patient Stated Goals  have a normal day without pain    Currently in Pain?  Yes    Pain Score  5     Pain Location  Back    Pain Orientation  Lower    Pain Descriptors / Indicators  Aching    Pain Type  Chronic pain    Pain Onset  More than a month ago         [facility] PT Assessment - 10/10/19 0001      Precautions   Precautions  Cervical    Precaution Comments  Cervical fusion surgery 09/26/19                   [facility] Adult PT Treatment/Exercise - 10/10/19 0001      Exercises   Exercises  Neck;Lumbar      Lumbar Exercises: Stretches   Double Knee to Chest Stretch  3 reps;20 seconds      Lumbar Exercises: Supine  Ab Set  15 reps    Pelvic Tilt  10 reps    Pelvic Tilt Limitations  posterior    Bent Knee Raise  20 reps    Bent Knee Raise Limitations  3'' holds alternating legs    Other Supine Lumbar Exercises  LTR x5 each side      Manual Therapy   Manual Therapy  Soft tissue mobilization    Manual therapy comments  done seperate from all other aspect of treatment.    Soft tissue mobilization  Lumbar paraspinals for pain control                PT Short Term Goals - 09/18/19 1231      PT SHORT TERM GOAL #1   Title  Patient will be IND with initial HEP to improve functional outcomes    Time  2    Period  Weeks    Status  On-going    Target Date  09/28/19        PT Long Term Goals - 09/18/19 1231      PT LONG TERM GOAL #1   Title  Patient will have greater than or equal to 4+/5 MMT throughout BLEs to improve functional mobility    Time  4    Period  Weeks    Status  On-going      PT LONG TERM GOAL #2   Title  Patient will have average pain level <5/10  for 7 days in order to improve ability to perform ADLs.    Time  4    Period  Weeks    Status  On-going      PT LONG TERM GOAL #3   Title  Patient will improve distance on 2 MWT by 75 feet in order to improve ability to safely ambulate within community    Time  4    Period  Weeks    Status  On-going      PT LONG TERM GOAL #4   Title  Patient will improve bilateral cervical rotation by 10 degrees in order to improve ability to scan environment/ look when making turns while driving.    Time  4    Period  Weeks    Status  On-going            Plan - 10/10/19 1619    Clinical Impression Statement  Patient reported having cervical fusion on 09/26/19. Discussed with patient the plan to focus only on lower back at this time and patient was agreeable to this as he stated his neck and arms are feeling better. Began session with soft tissue mobilization to decrease patient's pain. Discussed with patient benefits of physical therapy to manage pain. Added posterior pelvic tilts, bent knee raises, and double knee to chest this session. Plan to re-assess patient next visit, with likely plan to extend therapy as patient has not been able to attend therapy due to cervical surgery.    Personal Factors and Comorbidities  Time since onset of injury/illness/exacerbation    Examination-Activity Limitations  Bathing;Locomotion Level;Transfers;Sit;Sleep;Carry;Bend;Squat;Dressing;Stairs;Stand;Lift    Examination-Participation Restrictions  Cleaning;Yard Work;Community Activity    Stability/Clinical Decision Making  Stable/Uncomplicated    Rehab Potential  Fair    PT Frequency  2x / week    PT Duration  4 weeks    PT Treatment/Interventions  ADLs/Self Care Home Management;Biofeedback;Cryotherapy;Electrical Stimulation;Gait training;Neuromuscular re-education;Stair training;Functional mobility training;Moist Heat;Traction;Ultrasound;DME Instruction;Balance training;Therapeutic exercise;Therapeutic  activities;Patient/family education;Orthotic Fit/Training;Passive range of motion;Taping;Manual techniques;Joint Manipulations;Energy conservation;Spinal Manipulations  PT Next Visit Plan  Re-assess next session. Focus on low back only at this time. Cervical surgery on 09/26/19 and would need a new referral to work on cervical if needed.    PT Home Exercise Plan  cervical,thoracic and lumbar excursions, ab set    Consulted and Agree with Plan of Care  Patient       Patient will benefit from skilled therapeutic intervention in order to improve the following deficits and impairments:  Abnormal gait, Decreased range of motion, Difficulty walking, Decreased activity tolerance, Hypomobility, Impaired flexibility, Improper body mechanics, Pain, Postural dysfunction, Decreased strength, Decreased mobility  Visit Diagnosis: Chronic bilateral low back pain without sciatica  Other abnormalities of gait and mobility     Problem List Patient Active Problem List   Diagnosis Date Noted  . SBO (small bowel obstruction) ([place]) 09/24/2017   Clarene Critchley PT, DPT 4:22 PM, 10/10/19 [place] [address] [place], [place], [address] Phone: [phone]   Fax:  [phone]  Name: ROCKY VITALI MRN: [id number] Date of Birth: [birth year]

## 2019-10-11 DIAGNOSIS — R339 Retention of urine, unspecified: Secondary | ICD-10-CM | POA: Diagnosis not present

## 2019-10-12 ENCOUNTER — Other Ambulatory Visit: Payer: Self-pay

## 2019-10-12 ENCOUNTER — Ambulatory Visit (HOSPITAL_COMMUNITY): Payer: BC Managed Care – PPO | Admitting: Physical Therapy

## 2019-10-12 ENCOUNTER — Encounter (HOSPITAL_COMMUNITY): Payer: Self-pay | Admitting: Physical Therapy

## 2019-10-12 DIAGNOSIS — R2689 Other abnormalities of gait and mobility: Secondary | ICD-10-CM

## 2019-10-12 DIAGNOSIS — M545 Low back pain, unspecified: Secondary | ICD-10-CM

## 2019-10-12 DIAGNOSIS — G8929 Other chronic pain: Secondary | ICD-10-CM | POA: Diagnosis not present

## 2019-10-12 NOTE — Therapy (Signed)
[place] [address] [place], [place], [address] Phone: [phone]   Fax:  [phone]  Physical Therapy Treatment/ Discharge Summary  Patient Details  Name: [name] MRN: [id number] Date of Birth: Apr 15, 1961 Referring Provider (PT): Blanche East MD  PHYSICAL THERAPY DISCHARGE SUMMARY  Visits from Start of Care: 4  Current functional level related to goals / functional outcomes: See below    Remaining deficits: See below   Education / Equipment: See assessment   Plan: Patient agrees to discharge.  Patient goals were not met. Patient is being discharged due to lack of progress.  ?????       Encounter Date: 10/12/2019  PT End of Session - 10/12/19 1824    Visit Number  4    Number of Visits  8    Date for PT Re-Evaluation  10/12/19    Authorization Type  [employer] 90 visits combined PT/OT/ chiro    Authorization - Visit Number  4    Authorization - Number of Visits  15    PT Start Time  1700    PT Stop Time  1815    PT Time Calculation (min)  35 min    Activity Tolerance  Patient limited by pain    Behavior During Therapy  [facility] for tasks assessed/performed       History reviewed. No pertinent past medical history.  Past Surgical History:  Procedure Laterality Date  . ANTERIOR CERVICAL DISCECTOMY  1999  . CERVICAL FUSION  2010, 2012  . [place]   right  . intestinal obstruction  2010  . OTHER SURGICAL HISTORY  1966   sternum surgery  . ribs removed as child  85  . thornwaldt cyst removed    . TONSILLECTOMY  1968  . TRANSURETHRAL RESECTION OF PROSTATE  2012  . ULNA OSTEOTOMY Right 10/09/2014   Procedure: ULNAR SHORTENING;  Surgeon: Daryll Brod, MD;  Location: [place];  Service: Orthopedics;  Laterality: Right;  . WRIST ARTHROSCOPY Right 10/09/2014   Procedure: RIGHT WRIST ARTHROSCOPY WITH ULNA SHORTENING OSTEOTOMY;  Surgeon: Daryll Brod, MD;  Location: [place];  Service: Orthopedics;  Laterality: Right;    There were no vitals filed for this visit.  Subjective Assessment - 10/12/19 1746    Subjective  Patient says his neck is doing great. Patient says his low back was feeling good until he stopped taking the pain medication he was prescribed following his neck surgery. Patient says today his back "has returned to being miserable" and reports current burning sensation in lumbar. Patient states that he feels exercises he has done so far in therapy have increased his pain and that he does not feel they are helping.    Pertinent History  Hx of cervical fusions    Limitations  Sitting;Lifting;Standing;Walking;House hold activities    How long can you sit comfortably?  unable    How long can you stand comfortably?  5 minutes    How long can you walk comfortably?  5 minutes    Diagnostic tests  MRI    Patient Stated Goals  have a normal day without pain    Currently in Pain?  Yes    Pain Score  9     Pain Location  Back    Pain Orientation  Right;Left;Posterior    Pain Descriptors / Indicators  Burning    Pain Type  Chronic pain    Pain Radiating Towards  Bilateral LEs    Pain Onset  More than a month ago    Pain Frequency  Constant    Pain Relieving Factors  Pain meds    Effect of Pain on Daily Activities  Limiting         [facility] PT Assessment - 10/12/19 0001      Assessment   Medical Diagnosis  Neck and Low back pain     Referring Provider (PT)  Blanche East MD    Prior Therapy  Yes      Precautions   Precautions  Cervical    Precaution Comments  Cervical fusion surgery 09/26/19      Restrictions   Weight Bearing Restrictions  No      [place] residence      Prior Function   Level of Independence  Independent      Cognition   Overall Cognitive Status  Within Functional Limits for tasks assessed      Observation/Other Assessments   Focus on Therapeutic Outcomes (FOTO)   70%  limited      AROM   Cervical Flexion  --   Cervical ROM NT this date per post surgical precaution    Lumbar Flexion  25% limited    Lumbar Extension  90% limited    Lumbar - Right Side Bend  WFL    Lumbar - Left Side Bend  WFL    Lumbar - Right Rotation  WFL    Lumbar - Left Rotation  [facility]      Strength   Right Hip Flexion  4+/5   was 4   Right Hip ABduction  4+/5   was 4 (tested in seated)   Right Hip ADduction  5/5   (tested in seated)   Left Hip Flexion  5/5    Left Hip ABduction  4+/5   (tested in seated)   Left Hip ADduction  5/5   (tested in seated)   Right Knee Flexion  5/5    Right Knee Extension  5/5    Left Knee Flexion  5/5    Left Knee Extension  5/5    Right Ankle Dorsiflexion  5/5   was 4+   Left Ankle Dorsiflexion  5/5      Ambulation/Gait   Ambulation/Gait  Yes    Ambulation/Gait Assistance  7: Independent    Ambulation Distance (Feet)  340 Feet    Assistive device  None    Gait Pattern  Antalgic;Decreased stance time - right;Decreased stride length    Ambulation Surface  Level    Gait Comments  2MWT         PT Education - 10/12/19 1824    Education Details  Patient educated on reassessment findings and DC status    Person(s) Educated  Patient    Methods  Explanation    Comprehension  Verbalized understanding       PT Short Term Goals - 10/12/19 1829      PT SHORT TERM GOAL #1   Title  Patient will be IND with initial HEP to improve functional outcomes    Time  2    Period  Weeks    Status  Achieved    Target Date  09/28/19        PT Long Term Goals - 10/12/19 1829      PT LONG TERM GOAL #1   Title  Patient will have greater than or equal to 4+/5 MMT throughout  BLEs to improve functional mobility    Baseline  MMT scores met but with increased pain    Time  4    Period  Weeks    Status  Partially Met      PT LONG TERM GOAL #2   Title  Patient will have average pain level <5/10 for 7 days in order to improve ability to perform  ADLs.    Time  4    Period  Weeks    Status  Not Met      PT LONG TERM GOAL #3   Title  Patient will improve distance on 2 MWT by 75 feet in order to improve ability to safely ambulate within community    Time  4    Period  Weeks    Status  Not Met      PT LONG TERM GOAL #4   Title  Patient will improve bilateral cervical rotation by 10 degrees in order to improve ability to scan environment/ look when making turns while driving.    Baseline  Not tested today per surgical precaution    Time  4    Period  Weeks    Status  Deferred            Plan - 10/12/19 1824    Clinical Impression Statement  Patient has made minimal progress to LTGs and shows decreased tolerance to functional activity with 2MWT as well as FOTO outcome scores. Patient did show some improvement in lumbar mobility, and slight progress with MMT measures, but continues to be limited in function by intractable lumbar pain. Patient attended 4 therapy sessions in total. Patient being discharged today to return to care of referring provider for further assessment. Patient instructed to follow up with physical therapy services with any further questions or concerns.    Personal Factors and Comorbidities  Time since onset of injury/illness/exacerbation    Examination-Activity Limitations  Bathing;Locomotion Level;Transfers;Sit;Sleep;Carry;Bend;Squat;Dressing;Stairs;Stand;Lift    Examination-Participation Restrictions  Cleaning;Yard Work;Community Activity    Stability/Clinical Decision Making  Stable/Uncomplicated    Rehab Potential  Fair    PT Treatment/Interventions  ADLs/Self Care Home Management;Biofeedback;Cryotherapy;Electrical Stimulation;Gait training;Neuromuscular re-education;Stair training;Functional mobility training;Moist Heat;Traction;Ultrasound;DME Instruction;Balance training;Therapeutic exercise;Therapeutic activities;Patient/family education;Orthotic Fit/Training;Passive range of motion;Taping;Manual  techniques;Joint Manipulations;Energy conservation;Spinal Manipulations    PT Next Visit Plan  DC    PT Home Exercise Plan  cervical,thoracic and lumbar excursions, ab set    Consulted and Agree with Plan of Care  Patient       Patient will benefit from skilled therapeutic intervention in order to improve the following deficits and impairments:  Abnormal gait, Decreased range of motion, Difficulty walking, Decreased activity tolerance, Hypomobility, Impaired flexibility, Improper body mechanics, Pain, Postural dysfunction, Decreased strength, Decreased mobility  Visit Diagnosis: Chronic bilateral low back pain without sciatica  Other abnormalities of gait and mobility     Problem List Patient Active Problem List   Diagnosis Date Noted  . SBO (small bowel obstruction) ([place]) 09/24/2017   6:37 PM, 10/12/19 Josue Hector PT DPT  Physical Therapist with [facility]  [phone] [place] [address] [place], [place], [address] Phone: [phone]   Fax:  [phone]  Name: [name] MRN: [id number] Date of Birth: [birth year]

## 2019-10-30 DIAGNOSIS — Z9889 Other specified postprocedural states: Secondary | ICD-10-CM | POA: Diagnosis not present

## 2019-10-30 DIAGNOSIS — M5033 Other cervical disc degeneration, cervicothoracic region: Secondary | ICD-10-CM | POA: Diagnosis not present

## 2019-10-30 DIAGNOSIS — M47813 Spondylosis without myelopathy or radiculopathy, cervicothoracic region: Secondary | ICD-10-CM | POA: Diagnosis not present

## 2019-11-06 DIAGNOSIS — N312 Flaccid neuropathic bladder, not elsewhere classified: Secondary | ICD-10-CM | POA: Diagnosis not present

## 2019-11-08 DIAGNOSIS — K219 Gastro-esophageal reflux disease without esophagitis: Secondary | ICD-10-CM | POA: Diagnosis not present

## 2019-11-08 DIAGNOSIS — R490 Dysphonia: Secondary | ICD-10-CM | POA: Diagnosis not present

## 2019-11-08 DIAGNOSIS — R1310 Dysphagia, unspecified: Secondary | ICD-10-CM | POA: Diagnosis not present

## 2019-11-08 DIAGNOSIS — J383 Other diseases of vocal cords: Secondary | ICD-10-CM | POA: Diagnosis not present

## 2019-11-09 DIAGNOSIS — R202 Paresthesia of skin: Secondary | ICD-10-CM | POA: Diagnosis not present

## 2019-11-09 DIAGNOSIS — R339 Retention of urine, unspecified: Secondary | ICD-10-CM | POA: Diagnosis not present

## 2019-11-09 DIAGNOSIS — Z01818 Encounter for other preprocedural examination: Secondary | ICD-10-CM | POA: Diagnosis not present

## 2019-11-09 DIAGNOSIS — R200 Anesthesia of skin: Secondary | ICD-10-CM | POA: Diagnosis not present

## 2019-11-09 DIAGNOSIS — M545 Low back pain: Secondary | ICD-10-CM | POA: Diagnosis not present

## 2019-11-14 DIAGNOSIS — K219 Gastro-esophageal reflux disease without esophagitis: Secondary | ICD-10-CM | POA: Diagnosis not present

## 2019-11-14 DIAGNOSIS — M5126 Other intervertebral disc displacement, lumbar region: Secondary | ICD-10-CM | POA: Diagnosis not present

## 2019-11-14 DIAGNOSIS — Z20828 Contact with and (suspected) exposure to other viral communicable diseases: Secondary | ICD-10-CM | POA: Diagnosis not present

## 2019-11-14 DIAGNOSIS — Z981 Arthrodesis status: Secondary | ICD-10-CM | POA: Diagnosis not present

## 2019-11-14 DIAGNOSIS — R339 Retention of urine, unspecified: Secondary | ICD-10-CM | POA: Diagnosis not present

## 2019-11-14 DIAGNOSIS — G8929 Other chronic pain: Secondary | ICD-10-CM | POA: Diagnosis not present

## 2019-11-14 DIAGNOSIS — K56609 Unspecified intestinal obstruction, unspecified as to partial versus complete obstruction: Secondary | ICD-10-CM | POA: Diagnosis not present

## 2019-11-14 DIAGNOSIS — M48062 Spinal stenosis, lumbar region with neurogenic claudication: Secondary | ICD-10-CM | POA: Diagnosis not present

## 2019-11-14 DIAGNOSIS — Z79899 Other long term (current) drug therapy: Secondary | ICD-10-CM | POA: Diagnosis not present

## 2019-11-14 DIAGNOSIS — N320 Bladder-neck obstruction: Secondary | ICD-10-CM | POA: Diagnosis not present

## 2019-11-14 DIAGNOSIS — M48061 Spinal stenosis, lumbar region without neurogenic claudication: Secondary | ICD-10-CM | POA: Diagnosis not present

## 2019-11-15 DIAGNOSIS — Z79899 Other long term (current) drug therapy: Secondary | ICD-10-CM | POA: Diagnosis not present

## 2019-11-15 DIAGNOSIS — Z20828 Contact with and (suspected) exposure to other viral communicable diseases: Secondary | ICD-10-CM | POA: Diagnosis not present

## 2019-11-15 DIAGNOSIS — Z981 Arthrodesis status: Secondary | ICD-10-CM | POA: Diagnosis not present

## 2019-11-15 DIAGNOSIS — N320 Bladder-neck obstruction: Secondary | ICD-10-CM | POA: Diagnosis not present

## 2019-11-15 DIAGNOSIS — M48062 Spinal stenosis, lumbar region with neurogenic claudication: Secondary | ICD-10-CM | POA: Diagnosis not present

## 2019-11-15 DIAGNOSIS — G8929 Other chronic pain: Secondary | ICD-10-CM | POA: Diagnosis not present

## 2019-11-15 DIAGNOSIS — K56609 Unspecified intestinal obstruction, unspecified as to partial versus complete obstruction: Secondary | ICD-10-CM | POA: Diagnosis not present

## 2019-11-15 DIAGNOSIS — M5126 Other intervertebral disc displacement, lumbar region: Secondary | ICD-10-CM | POA: Diagnosis not present

## 2019-11-15 DIAGNOSIS — R339 Retention of urine, unspecified: Secondary | ICD-10-CM | POA: Diagnosis not present

## 2019-11-15 DIAGNOSIS — K219 Gastro-esophageal reflux disease without esophagitis: Secondary | ICD-10-CM | POA: Diagnosis not present

## 2019-12-01 DIAGNOSIS — R490 Dysphonia: Secondary | ICD-10-CM | POA: Diagnosis not present

## 2019-12-01 DIAGNOSIS — R1310 Dysphagia, unspecified: Secondary | ICD-10-CM | POA: Diagnosis not present

## 2019-12-01 DIAGNOSIS — R1312 Dysphagia, oropharyngeal phase: Secondary | ICD-10-CM | POA: Diagnosis not present

## 2019-12-22 DIAGNOSIS — Z6829 Body mass index (BMI) 29.0-29.9, adult: Secondary | ICD-10-CM | POA: Diagnosis not present

## 2019-12-22 DIAGNOSIS — E663 Overweight: Secondary | ICD-10-CM | POA: Diagnosis not present

## 2019-12-22 DIAGNOSIS — R1031 Right lower quadrant pain: Secondary | ICD-10-CM | POA: Diagnosis not present

## 2019-12-25 DIAGNOSIS — R339 Retention of urine, unspecified: Secondary | ICD-10-CM | POA: Diagnosis not present

## 2019-12-26 DIAGNOSIS — R490 Dysphonia: Secondary | ICD-10-CM | POA: Diagnosis not present

## 2019-12-26 DIAGNOSIS — J383 Other diseases of vocal cords: Secondary | ICD-10-CM | POA: Diagnosis not present

## 2019-12-26 DIAGNOSIS — R1312 Dysphagia, oropharyngeal phase: Secondary | ICD-10-CM | POA: Diagnosis not present

## 2020-01-24 DIAGNOSIS — Z6829 Body mass index (BMI) 29.0-29.9, adult: Secondary | ICD-10-CM | POA: Diagnosis not present

## 2020-01-24 DIAGNOSIS — M62830 Muscle spasm of back: Secondary | ICD-10-CM | POA: Diagnosis not present

## 2020-01-24 DIAGNOSIS — M5410 Radiculopathy, site unspecified: Secondary | ICD-10-CM | POA: Diagnosis not present

## 2020-01-24 DIAGNOSIS — K429 Umbilical hernia without obstruction or gangrene: Secondary | ICD-10-CM | POA: Diagnosis not present

## 2020-02-01 DIAGNOSIS — Z23 Encounter for immunization: Secondary | ICD-10-CM | POA: Diagnosis not present

## 2020-02-05 ENCOUNTER — Ambulatory Visit (HOSPITAL_COMMUNITY): Payer: BC Managed Care – PPO | Attending: Neurosurgery | Admitting: Physical Therapy

## 2020-02-05 ENCOUNTER — Other Ambulatory Visit: Payer: Self-pay

## 2020-02-05 DIAGNOSIS — G8929 Other chronic pain: Secondary | ICD-10-CM | POA: Diagnosis not present

## 2020-02-05 DIAGNOSIS — Z9889 Other specified postprocedural states: Secondary | ICD-10-CM | POA: Insufficient documentation

## 2020-02-05 DIAGNOSIS — M545 Low back pain: Secondary | ICD-10-CM | POA: Insufficient documentation

## 2020-02-05 DIAGNOSIS — R262 Difficulty in walking, not elsewhere classified: Secondary | ICD-10-CM

## 2020-02-05 DIAGNOSIS — R2689 Other abnormalities of gait and mobility: Secondary | ICD-10-CM | POA: Diagnosis not present

## 2020-02-05 DIAGNOSIS — M5441 Lumbago with sciatica, right side: Secondary | ICD-10-CM | POA: Insufficient documentation

## 2020-02-05 DIAGNOSIS — M5442 Lumbago with sciatica, left side: Secondary | ICD-10-CM | POA: Diagnosis not present

## 2020-02-05 NOTE — Therapy (Signed)
[facility] [address] [place], [place], [address] Phone: [phone]   Fax:  [phone]  Physical Therapy Evaluation  Patient Details  Name: [name] MRN: [id number] Date of Birth: 1961-10-19 Referring Provider (PT): Blanche East MD   Encounter Date: 02/05/2020  PT End of Session - 02/05/20 1245    Visit Number  1    Number of Visits  8    Date for PT Re-Evaluation  03/04/20    Authorization Type  [employer] 90 visits combined PT/OT/ chiro, no auth    Authorization Time Period  02/05/2020 to 03/04/2020    Progress Note Due on Visit  8    PT Start Time  1045    PT Stop Time  1130    PT Time Calculation (min)  45 min    Activity Tolerance  Patient limited by pain    Behavior During Therapy  [facility] for tasks assessed/performed;Flat affect       No past medical history on file.  Past Surgical History:  Procedure Laterality Date  . ANTERIOR CERVICAL DISCECTOMY  1999  . CERVICAL FUSION  2010, 2012  . [place]   right  . intestinal obstruction  2010  . OTHER SURGICAL HISTORY  1966   sternum surgery  . ribs removed as child  13  . thornwaldt cyst removed    . TONSILLECTOMY  1968  . TRANSURETHRAL RESECTION OF PROSTATE  2012  . ULNA OSTEOTOMY Right 10/09/2014   Procedure: ULNAR SHORTENING;  Surgeon: Daryll Brod, MD;  Location: [place] [facility];  Service: Orthopedics;  Laterality: Right;  . WRIST ARTHROSCOPY Right 10/09/2014   Procedure: RIGHT WRIST ARTHROSCOPY WITH ULNA SHORTENING OSTEOTOMY;  Surgeon: Daryll Brod, MD;  Location: [facility];  Service: Orthopedics;  Laterality: Right;    There were no vitals filed for this visit.   Subjective Assessment - 02/05/20 1243    Subjective  Patient complains of chronic neck and back pain. States he underwent cervical surgery 09/26/2019 and lumbar surgery 11/14/19. Since his surgery his pain has gotten worse. States that he now has sharp pain in his  hips and legs with the left leg more then the right. State he also has tingling in his feet that gets worse with walking. States he also has pain in his neck and can't move his neck much. Reports he also has a mass that is growing on his larynx that he is seeing an MD for. In addition to this, when he woke up from his surgery he had incontinence issues and now has urinary retention issues. His MD is aware and he is also being treated for this. States that he has to lay on his right side to sleep, everything else is uncomfortable. States he is unsure if PT will help as previously it just seemed to make things worse.    Pertinent History  Hx of cervical fusion    Limitations  Sitting;Lifting;Standing;Walking;House hold activities    How long can you stand comfortably?  1-2 minutes    How long can you walk comfortably?  1-2 minutes    Patient Stated Goals  to have less pain    Currently in Pain?  Yes    Pain Score  7     Pain Location  Back   hips, ankles, neck   Pain Descriptors / Indicators  Sharp;Stabbing    Pain Type  Chronic pain;Surgical pain    Pain Radiating Towards  bilateral LEs    Pain Onset  More than a month ago    Pain Frequency  Constant    Aggravating Factors   walking, standing, sittin    Pain Relieving Factors  pain meds         [facility] PT Assessment - 02/05/20 0001      Assessment   Medical Diagnosis  s/p lumbar surgery    Referring Provider (PT)  Blanche East MD      Precautions   Precautions  Cervical    Precaution Comments  Cervical fusion surgery 09/26/19- no lifting anything > 20 pounds      Restrictions   Weight Bearing Restrictions  No      Home Environment   Living Environment  Private residence    Available Help at Discharge  [place] to enter    Entrance Stairs-Number of Steps  2    Home Layout  Two level    Alternate Level Stairs-Number of Steps  [place] seat      Prior Function   Level of Independence   Independent      Cognition   Overall Cognitive Status  Within Functional Limits for tasks assessed      Observation/Other Assessments   Focus on Therapeutic Outcomes (FOTO)   80% limited      Posture/Postural Control   Posture/Postural Control  Postural limitations    Posture Comments  stiff, minimal trunk and cervical mobility noted wtih functional tasks and ambulation       AROM   Lumbar Flexion  75% limited    Lumbar Extension  100% limited - increased pain on right side    Lumbar - Right Side Bend  75% limited - increased right side pain    Lumbar - Left Side Bend  75% limited      Transfers   Transfers  Sit to Stand;Stand to Sit    Sit to Stand  6: Modified independent (Device/Increase time)    Five time sit to stand comments   24.6 seconds   at previous POC it was 18.6 seconds, no UE    Stand to Sit  6: Modified independent (Device/Increase time)    Comments  slow labored movements with an increase in symptoms in neck and back       Ambulation/Gait   Ambulation/Gait  Yes    Ambulation/Gait Assistance  7: Independent    Ambulation Distance (Feet)  302 Feet    Assistive device  None    Gait Pattern  Decreased hip/knee flexion - right;Decreased hip/knee flexion - left;Decreased trunk rotation    Ambulation Surface  Level;Indoor    Gait Comments  2MWT                Objective measurements completed on examination: See above findings.              PT Education - 02/05/20 1116    Education Details  on current condition, aquatic benefits, and plan with PT.    Person(s) Educated  Patient    Methods  Explanation    Comprehension  Verbalized understanding       PT Short Term Goals - 02/05/20 1231      PT SHORT TERM GOAL #1   Title  Patient will report at least 25% improvement in overall symptoms and/or functional ability.    Time  2    Period  Weeks  Status  New    Target Date  02/19/20      PT SHORT TERM GOAL #2   Title  Patient will be  independent in HEP to improve functional outcomes    Time  2    Period  Weeks    Status  New    Target Date  02/19/20        PT Long Term Goals - 02/05/20 1233      PT LONG TERM GOAL #1   Title  Patient will be able to ambulate at least 350 feet in 2 minutes to improve functional mobility    Time  4    Period  Weeks    Status  New    Target Date  03/04/20      PT LONG TERM GOAL #2   Title  Patient will report at least 50% improvement in overall symptoms and/or functional ability.    Time  4    Period  Weeks    Status  New    Target Date  03/04/20      PT LONG TERM GOAL #3   Title  Patient will be able to perform 5x STS in <20 seconds to demonstrate improved functional mobility             Plan - 02/05/20 1236    Clinical Impression Statement  Patient presents to therapy s/p lumbar decompression with reports of worsening symptoms since surgery. Patient has symptoms radiating into both his legs with left more than right. Patient unsure if physical therapy will help as previously he his symptoms increased with physical therapy. Educated patient on possible benefits of aquatic therapy in addition to land therapy and patient open to trying aquatics to help with healing and decreasing his symptoms. Guarded prognosis noted secondary to poor response to physical therapy previously and high levels of chronic pain. Will trial physical therapy to determine if it Is beneficial post-operatively.    Personal Factors and Comorbidities  Time since onset of injury/illness/exacerbation;Comorbidity 1;Comorbidity 2;Past/Current Experience    Comorbidities  s/p ACDF C2-3 09/26/19 abnd lumbar decompression 11/14/19    Examination-Activity Limitations  Bathing;Locomotion Level;Transfers;Sit;Sleep;Carry;Bend;Squat;Dressing;Stairs;Stand;Lift;Bed Mobility;Reach Overhead;Continence    Examination-Participation Restrictions  Cleaning;Yard Work;Community Activity;Driving;Meal Prep;Shop     Stability/Clinical Decision Making  Evolving/Moderate complexity    Clinical Decision Making  Moderate    Rehab Potential  Poor    PT Frequency  2x / week    PT Duration  4 weeks    PT Treatment/Interventions  ADLs/Self Care Home Management;Biofeedback;Cryotherapy;Electrical Stimulation;Gait training;Neuromuscular re-education;Stair training;Functional mobility training;Moist Heat;Traction;Ultrasound;DME Instruction;Balance training;Therapeutic exercise;Therapeutic activities;Patient/family education;Orthotic Fit/Training;Passive range of motion;Taping;Manual techniques;Joint Manipulations;Energy conservation;Spinal Manipulations;Aquatic Therapy;Dry needling    PT Next Visit Plan  aquatic therapy. functional strength, gentle lumbar ROM - unweighted. TRA strengthening/isometrics    PT Home Exercise Plan  start next session    Consulted and Agree with Plan of Care  Patient       Patient will benefit from skilled therapeutic intervention in order to improve the following deficits and impairments:  Abnormal gait, Decreased range of motion, Difficulty walking, Decreased activity tolerance, Hypomobility, Impaired flexibility, Improper body mechanics, Pain, Postural dysfunction, Decreased strength, Decreased mobility, Decreased endurance  Visit Diagnosis: Chronic midline low back pain with bilateral sciatica  Difficulty in walking, not elsewhere classified  Status post lumbar surgery     Problem List Patient Active Problem List   Diagnosis Date Noted  . SBO (small bowel obstruction) ([place]) 09/24/2017   12:51 PM, 02/05/20 Jerene Pitch,  DPT Physical Therapy with [facility]  [phone] office  [facility] [address] [place], [place], [address] Phone: [phone]   Fax:  [phone]  Name: WILBURT FLEITES MRN: [id number] Date of Birth: 1961/11/14

## 2020-02-08 ENCOUNTER — Other Ambulatory Visit: Payer: Self-pay

## 2020-02-08 ENCOUNTER — Encounter (HOSPITAL_COMMUNITY): Payer: Self-pay | Admitting: Physical Therapy

## 2020-02-08 ENCOUNTER — Ambulatory Visit (HOSPITAL_COMMUNITY): Payer: BC Managed Care – PPO | Admitting: Physical Therapy

## 2020-02-08 DIAGNOSIS — Z9889 Other specified postprocedural states: Secondary | ICD-10-CM

## 2020-02-08 DIAGNOSIS — M5441 Lumbago with sciatica, right side: Secondary | ICD-10-CM | POA: Diagnosis not present

## 2020-02-08 DIAGNOSIS — M5442 Lumbago with sciatica, left side: Secondary | ICD-10-CM | POA: Diagnosis not present

## 2020-02-08 DIAGNOSIS — R262 Difficulty in walking, not elsewhere classified: Secondary | ICD-10-CM

## 2020-02-08 DIAGNOSIS — G8929 Other chronic pain: Secondary | ICD-10-CM

## 2020-02-08 DIAGNOSIS — M545 Low back pain: Secondary | ICD-10-CM | POA: Diagnosis not present

## 2020-02-08 DIAGNOSIS — R2689 Other abnormalities of gait and mobility: Secondary | ICD-10-CM | POA: Diagnosis not present

## 2020-02-08 NOTE — Patient Instructions (Signed)
Access Code: [email or web address] URL: [email or web address] Date: 02/08/2020 Prepared by: Josue Hector  Exercises Supine Transversus Abdominis Bracing - Hands on Stomach - 2 x daily - 7 x weekly - 2 sets - 10 reps - 5 hold Bent Knee Fallouts with Alternating Legs - 2 x daily - 7 x weekly - 2 sets - 10 reps Hooklying Gluteal Sets - 2 x daily - 7 x weekly - 2 sets - 10 reps - 5 hold Supine Lower Trunk Rotation - 2 x daily - 7 x weekly - 2 sets - 10 reps - 5 hold

## 2020-02-08 NOTE — Therapy (Signed)
[facility] [place], [place], [address] Phone: [phone]   Fax:  [phone]  Physical Therapy Treatment  Patient Details  Name: [name] MRN: [id number] Date of Birth: 08/13/1961 Referring Provider (PT): Blanche East MD   Encounter Date: 02/08/2020  PT End of Session - 02/08/20 1417    Visit Number  2    Number of Visits  8    Date for PT Re-Evaluation  03/04/20    Authorization Type  [employer] 90 visits combined PT/OT/ chiro, no auth    Authorization Time Period  02/05/2020 to 03/04/2020    Progress Note Due on Visit  8    PT Start Time  1415    PT Stop Time  1500    PT Time Calculation (min)  45 min    Activity Tolerance  Patient tolerated treatment well    Behavior During Therapy  [facility] for tasks assessed/performed;Flat affect       History reviewed. No pertinent past medical history.  Past Surgical History:  Procedure Laterality Date  . ANTERIOR CERVICAL DISCECTOMY  1999  . CERVICAL FUSION  2010, 2012  . [place]   right  . intestinal obstruction  2010  . OTHER SURGICAL HISTORY  1966   sternum surgery  . ribs removed as child  67  . thornwaldt cyst removed    . TONSILLECTOMY  1968  . TRANSURETHRAL RESECTION OF PROSTATE  2012  . ULNA OSTEOTOMY Right 10/09/2014   Procedure: ULNAR SHORTENING;  Surgeon: Daryll Brod, MD;  Location: [facility];  Service: Orthopedics;  Laterality: Right;  . WRIST ARTHROSCOPY Right 10/09/2014   Procedure: RIGHT WRIST ARTHROSCOPY WITH ULNA SHORTENING OSTEOTOMY;  Surgeon: Daryll Brod, MD;  Location: [facility];  Service: Orthopedics;  Laterality: Right;    There were no vitals filed for this visit.  Subjective Assessment - 02/08/20 1432    Subjective  Patient says the stinging and burning in his hands and legs has decreased since surgery. Says he has different hurting pains in neck and legs and feet. Says his neck mobility is much  less since having surgery. Also noted ringing in his ears, and some difficulty swallowing food he reports since surgery, says his MDs are aware.    Pertinent History  Hx of cervical fusion    Limitations  Sitting;Lifting;Standing;Walking;House hold activities    How long can you stand comfortably?  1-2 minutes    How long can you walk comfortably?  1-2 minutes    Patient Stated Goals  to have less pain    Currently in Pain?  Yes    Pain Score  8     Pain Location  Back    Pain Orientation  Right;Left;Posterior    Pain Descriptors / Indicators  Aching    Pain Type  Chronic pain;Surgical pain    Pain Onset  More than a month ago                       [facility] Adult PT Treatment/Exercise - 02/08/20 0001      Exercises   Exercises  Lumbar      Lumbar Exercises: Stretches   Lower Trunk Rotation  5 reps;10 seconds      Lumbar Exercises: Supine   Ab Set  10 reps;5 seconds    Glut Set  10 reps;5 seconds    Clam  10 reps;5 seconds  PT Education - 02/08/20 1452    Education Details  on surgical procedure, chronic pain pathways, cervical and thoracic spine anatomy and correlation, HEP    Person(s) Educated  Patient    Methods  Explanation;Handout    Comprehension  Verbalized understanding       PT Short Term Goals - 02/05/20 1231      PT SHORT TERM GOAL #1   Title  Patient will report at least 25% improvement in overall symptoms and/or functional ability.    Time  2    Period  Weeks    Status  New    Target Date  02/19/20      PT SHORT TERM GOAL #2   Title  Patient will be independent in HEP to improve functional outcomes    Time  2    Period  Weeks    Status  New    Target Date  02/19/20        PT Long Term Goals - 02/05/20 1233      PT LONG TERM GOAL #1   Title  Patient will be able to ambulate at least 350 feet in 2 minutes to improve functional mobility    Time  4    Period  Weeks    Status  New    Target Date  03/04/20      PT  LONG TERM GOAL #2   Title  Patient will report at least 50% improvement in overall symptoms and/or functional ability.    Time  4    Period  Weeks    Status  New    Target Date  03/04/20      PT LONG TERM GOAL #3   Title  Patient will be able to perform 5x STS in <20 seconds to demonstrate improved functional mobility            Plan - 02/08/20 1512    Clinical Impression Statement  Reviewed therapy goals and issued HEP. Patient educated on and performed HEP exercise for core strengthening and gentle lumbar mobility. Patient educated on post-operative expectations and chronic pain pathway. Patient cued on performing all activity through pain free ROM and monitored pain level through treatment. Patient performed all activity in slow controlled manner. Patient reported no increased pain in lumbar, and was issued HEP handout.    Personal Factors and Comorbidities  Time since onset of injury/illness/exacerbation;Comorbidity 1;Comorbidity 2;Past/Current Experience    Comorbidities  s/p ACDF C2-3 09/26/19 abnd lumbar decompression 11/14/19    Examination-Activity Limitations  Bathing;Locomotion Level;Transfers;Sit;Sleep;Carry;Bend;Squat;Dressing;Stairs;Stand;Lift;Bed Mobility;Reach Overhead;Continence    Examination-Participation Restrictions  Cleaning;Yard Work;Community Activity;Driving;Meal Prep;Shop    Stability/Clinical Decision Making  Evolving/Moderate complexity    Rehab Potential  Poor    PT Frequency  2x / week    PT Duration  4 weeks    PT Treatment/Interventions  ADLs/Self Care Home Management;Biofeedback;Cryotherapy;Electrical Stimulation;Gait training;Neuromuscular re-education;Stair training;Functional mobility training;Moist Heat;Traction;Ultrasound;DME Instruction;Balance training;Therapeutic exercise;Therapeutic activities;Patient/family education;Orthotic Fit/Training;Passive range of motion;Taping;Manual techniques;Joint Manipulations;Energy conservation;Spinal  Manipulations;Aquatic Therapy;Dry needling    PT Next Visit Plan  aquatic therapy. functional strength, gentle lumbar ROM - unweighted. TRA strengthening/isometrics. Cotninue to progress core and lumbar mobility as tolerated, add manual tx PRN    PT Home Exercise Plan  02/08/20: ab set, supine clam for core, glute set, LTR    Consulted and Agree with Plan of Care  Patient       Patient will benefit from skilled therapeutic intervention in order to improve the following deficits and impairments:  Abnormal  gait, Decreased range of motion, Difficulty walking, Decreased activity tolerance, Hypomobility, Impaired flexibility, Improper body mechanics, Pain, Postural dysfunction, Decreased strength, Decreased mobility, Decreased endurance  Visit Diagnosis: Chronic midline low back pain with bilateral sciatica  Difficulty in walking, not elsewhere classified  Status post lumbar surgery     Problem List Patient Active Problem List   Diagnosis Date Noted  . SBO (small bowel obstruction) ([place]) 09/24/2017   3:15 PM, 02/08/20 Josue Hector PT DPT  Physical Therapist with [facility]  [phone] [place] [address] [place], [place], [address] Phone: [phone]   Fax:  [phone]  Name: [name] MRN: [id number] Date of Birth: [birth year]

## 2020-02-12 ENCOUNTER — Ambulatory Visit (HOSPITAL_COMMUNITY): Payer: BC Managed Care – PPO

## 2020-02-12 ENCOUNTER — Telehealth (HOSPITAL_COMMUNITY): Payer: Self-pay

## 2020-02-12 NOTE — Telephone Encounter (Signed)
pt cancelled appt today because he is sick on the stomach

## 2020-02-13 DIAGNOSIS — R339 Retention of urine, unspecified: Secondary | ICD-10-CM | POA: Diagnosis not present

## 2020-02-15 ENCOUNTER — Encounter (HOSPITAL_COMMUNITY): Payer: Self-pay

## 2020-02-15 ENCOUNTER — Other Ambulatory Visit: Payer: Self-pay

## 2020-02-15 ENCOUNTER — Ambulatory Visit (HOSPITAL_COMMUNITY): Payer: BC Managed Care – PPO

## 2020-02-15 DIAGNOSIS — M545 Low back pain: Secondary | ICD-10-CM | POA: Diagnosis not present

## 2020-02-15 DIAGNOSIS — G8929 Other chronic pain: Secondary | ICD-10-CM | POA: Diagnosis not present

## 2020-02-15 DIAGNOSIS — R262 Difficulty in walking, not elsewhere classified: Secondary | ICD-10-CM

## 2020-02-15 DIAGNOSIS — M5442 Lumbago with sciatica, left side: Secondary | ICD-10-CM

## 2020-02-15 DIAGNOSIS — M5441 Lumbago with sciatica, right side: Secondary | ICD-10-CM | POA: Diagnosis not present

## 2020-02-15 DIAGNOSIS — Z9889 Other specified postprocedural states: Secondary | ICD-10-CM

## 2020-02-15 DIAGNOSIS — R2689 Other abnormalities of gait and mobility: Secondary | ICD-10-CM | POA: Diagnosis not present

## 2020-02-15 NOTE — Therapy (Signed)
[place], [place], [address] Phone: [phone]   Fax:  [phone]  Physical Therapy Treatment  Patient Details  Name: [name] MRN: [id number] Date of Birth: [birth year] Referring Provider (PT): Blanche East MD   Encounter Date: 02/15/2020  PT End of Session - 02/15/20 1431    Visit Number  3    Number of Visits  8    Date for PT Re-Evaluation  03/04/20    Authorization Type  [employer] 90 visits combined PT/OT/ chiro, no auth    Authorization Time Period  02/05/2020 to 03/04/2020    Progress Note Due on Visit  8    PT Start Time  1350    PT Stop Time  1435    PT Time Calculation (min)  45 min    Activity Tolerance  Patient tolerated treatment well;Patient limited by pain    Behavior During Therapy  [facility] for tasks assessed/performed       History reviewed. No pertinent past medical history.  Past Surgical History:  Procedure Laterality Date  . ANTERIOR CERVICAL DISCECTOMY  1999  . CERVICAL FUSION  2010, 2012  . [place]   right  . intestinal obstruction  2010  . OTHER SURGICAL HISTORY  1966   sternum surgery  . ribs removed as child  60  . thornwaldt cyst removed    . TONSILLECTOMY  1968  . TRANSURETHRAL RESECTION OF PROSTATE  2012  . ULNA OSTEOTOMY Right 10/09/2014   Procedure: ULNAR SHORTENING;  Surgeon: Daryll Brod, MD;  Location: [place];  Service: Orthopedics;  Laterality: Right;  . WRIST ARTHROSCOPY Right 10/09/2014   Procedure: RIGHT WRIST ARTHROSCOPY WITH ULNA SHORTENING OSTEOTOMY;  Surgeon: Daryll Brod, MD;  Location: [place];  Service: Orthopedics;  Laterality: Right;    There were no vitals filed for this visit.  Subjective Assessment - 02/15/20 1351    Subjective  Pt reports the more he does, the more it hurts. Pt reports pain is going down L leg and L side of neck hurts. Pt reports pain hits his feet and ankles with walking, sore to  the touch. Pt reports unable to sleep much. Pt reports umbilical hernia that is protruding, which "ain't right" but no real pain. Pt reports cathing 3-4x/day is causing abdominal pain. Pt reports exercises are not making his back feel better, they are making his back feel worse. Pt reports frustration with inability to go aquatic therapy. Pt reports no muscle soreness with exercises, only back pain.    Pertinent History  Hx of cervical fusion    Limitations  Sitting;Lifting;Standing;Walking;House hold activities    How long can you stand comfortably?  1-2 minutes    How long can you walk comfortably?  1-2 minutes    Patient Stated Goals  to have less pain    Currently in Pain?  Yes    Pain Score  7     Pain Location  Back    Pain Orientation  Right;Left;Lower    Pain Descriptors / Indicators  Sharp    Pain Radiating Towards  back down lateral L leg to arch, neck to skull on L side    Pain Onset  More than a month ago    Aggravating Factors   walking, standing, sitting    Pain Relieving Factors  pain meds             [facility] Adult PT Treatment/Exercise -  02/15/20 0001      Lumbar Exercises: Stretches   Lower Trunk Rotation  5 reps;10 seconds      Lumbar Exercises: Seated   Other Seated Lumbar Exercises  posterior pelvic tilts, x5 reps with limited ROM      Lumbar Exercises: Supine   Ab Set  20 reps;3 seconds    Pelvic Tilt  15 reps    Pelvic Tilt Limitations  posterior, heavy verbal and tactile cues with poor activation    Glut Set  10 reps    Glut Set Limitations  2 sets             PT Education - 02/15/20 1447    Education Details  Educated pt on benefits and purpose of therapy on land and squatic; abdominal anatomy and abdominal separation resulting in weakness; verbal/tactile cues for exercise techniques; reviewed HEP again for proper technique    Person(s) Educated  Patient    Methods  Explanation;Demonstration;Tactile cues;Verbal cues    Comprehension  Verbalized  understanding;Returned demonstration;Verbal cues required;Tactile cues required       PT Short Term Goals - 02/15/20 1433      PT SHORT TERM GOAL #1   Title  Patient will report at least 25% improvement in overall symptoms and/or functional ability.    Time  2    Period  Weeks    Status  On-going    Target Date  02/19/20      PT SHORT TERM GOAL #2   Title  Patient will be independent in HEP to improve functional outcomes    Time  2    Period  Weeks    Status  On-going    Target Date  02/19/20        PT Long Term Goals - 02/15/20 1433      PT LONG TERM GOAL #1   Title  Patient will be able to ambulate at least 350 feet in 2 minutes to improve functional mobility    Time  4    Period  Weeks    Status  On-going      PT LONG TERM GOAL #2   Title  Patient will report at least 50% improvement in overall symptoms and/or functional ability.    Time  4    Period  Weeks    Status  On-going      PT LONG TERM GOAL #3   Title  Patient will be able to perform 5x STS in <20 seconds to demonstrate improved functional mobility    Status  On-going            Plan - 02/15/20 1432    Clinical Impression Statement  Began core engagement and pt with visible abdominal coning. Educated pt on correct core activation, abdominal anatomy, separating of abdominals (rectus abdominus). Pt requires significant education on purpose of therapy, benefit of aquatics and land therapy to improve mobility and strengthening. Pt tolerates exercises this session, requiring heavy verbal and tactile cues for form. Added posterior pelvic tilts with significant difficulty achieving positioning, but denies pain. Pt agreeable to manual STM next session to attempt to improve pain. Pt denies pain at EOS. Continue to progress as able.    Personal Factors and Comorbidities  Time since onset of injury/illness/exacerbation;Comorbidity 1;Comorbidity 2;Past/Current Experience    Comorbidities  s/p ACDF C2-3 09/26/19 abnd  lumbar decompression 11/14/19    Examination-Activity Limitations  Bathing;Locomotion Level;Transfers;Sit;Sleep;Carry;Bend;Squat;Dressing;Stairs;Stand;Lift;Bed Mobility;Reach Overhead;Continence    Examination-Participation Restrictions  Cleaning;Yard Work;Community Activity;Driving;Meal Prep;Shop  Stability/Clinical Decision Making  Evolving/Moderate complexity    Rehab Potential  Poor    PT Frequency  2x / week    PT Duration  4 weeks    PT Treatment/Interventions  ADLs/Self Care Home Management;Biofeedback;Cryotherapy;Electrical Stimulation;Gait training;Neuromuscular re-education;Stair training;Functional mobility training;Moist Heat;Traction;Ultrasound;DME Instruction;Balance training;Therapeutic exercise;Therapeutic activities;Patient/family education;Orthotic Fit/Training;Passive range of motion;Taping;Manual techniques;Joint Manipulations;Energy conservation;Spinal Manipulations;Aquatic Therapy;Dry needling    PT Next Visit Plan  Trial gentle manual STM. aquatic therapy, core strength, gentle lumbar ROM - unweighted. TRA strengthening/isometrics. Cotninue to progress core and lumbar mobility as tolerated.    PT Home Exercise Plan  02/08/20: ab set, supine clam for core, glute set, LTR    Consulted and Agree with Plan of Care  Patient       Patient will benefit from skilled therapeutic intervention in order to improve the following deficits and impairments:  Abnormal gait, Decreased range of motion, Difficulty walking, Decreased activity tolerance, Hypomobility, Impaired flexibility, Improper body mechanics, Pain, Postural dysfunction, Decreased strength, Decreased mobility, Decreased endurance  Visit Diagnosis: Chronic midline low back pain with bilateral sciatica  Difficulty in walking, not elsewhere classified  Status post lumbar surgery     Problem List Patient Active Problem List   Diagnosis Date Noted  . SBO (small bowel obstruction) ([place]) 09/24/2017     Talbot Grumbling  PT, DPT 02/15/20, 2:50 PM [place] [address] [place], [place], [address] Phone: [phone]   Fax:  [phone]  Name: [name] MRN: [id number] Date of Birth: 1961-02-28

## 2020-02-19 ENCOUNTER — Other Ambulatory Visit: Payer: Self-pay

## 2020-02-19 ENCOUNTER — Encounter (HOSPITAL_COMMUNITY): Payer: Self-pay | Admitting: Physical Therapy

## 2020-02-19 ENCOUNTER — Ambulatory Visit (HOSPITAL_COMMUNITY): Payer: BC Managed Care – PPO | Admitting: Physical Therapy

## 2020-02-19 DIAGNOSIS — M5442 Lumbago with sciatica, left side: Secondary | ICD-10-CM | POA: Diagnosis not present

## 2020-02-19 DIAGNOSIS — M545 Other chronic pain: Secondary | ICD-10-CM

## 2020-02-19 DIAGNOSIS — M5441 Lumbago with sciatica, right side: Secondary | ICD-10-CM | POA: Diagnosis not present

## 2020-02-19 DIAGNOSIS — G8929 Other chronic pain: Secondary | ICD-10-CM | POA: Diagnosis not present

## 2020-02-19 DIAGNOSIS — Z9889 Other specified postprocedural states: Secondary | ICD-10-CM

## 2020-02-19 DIAGNOSIS — R2689 Other abnormalities of gait and mobility: Secondary | ICD-10-CM

## 2020-02-19 DIAGNOSIS — R262 Difficulty in walking, not elsewhere classified: Secondary | ICD-10-CM | POA: Diagnosis not present

## 2020-02-19 NOTE — Therapy (Signed)
[place], [place], [address] Phone: [phone]   Fax:  [phone]  Physical Therapy Treatment  Patient Details  Name: [name] MRN: [id number] Date of Birth: 02/25/1961 Referring Provider (PT): Blanche East MD   Encounter Date: 02/19/2020  PT End of Session - 02/19/20 1402    Visit Number  4    Number of Visits  8    Date for PT Re-Evaluation  03/04/20    Authorization Type  [employer] 90 visits combined PT/OT/ chiro, no auth    Authorization Time Period  02/05/2020 to 03/04/2020    Progress Note Due on Visit  8    PT Start Time  1402    PT Stop Time  1442    PT Time Calculation (min)  40 min    Activity Tolerance  Patient tolerated treatment well;Patient limited by pain    Behavior During Therapy  [facility] for tasks assessed/performed       History reviewed. No pertinent past medical history.  Past Surgical History:  Procedure Laterality Date  . ANTERIOR CERVICAL DISCECTOMY  1999  . CERVICAL FUSION  2010, 2012  . [place]   right  . intestinal obstruction  2010  . OTHER SURGICAL HISTORY  1966   sternum surgery  . ribs removed as child  78  . thornwaldt cyst removed    . TONSILLECTOMY  1968  . TRANSURETHRAL RESECTION OF PROSTATE  2012  . ULNA OSTEOTOMY Right 10/09/2014   Procedure: ULNAR SHORTENING;  Surgeon: Daryll Brod, MD;  Location: [place];  Service: Orthopedics;  Laterality: Right;  . WRIST ARTHROSCOPY Right 10/09/2014   Procedure: RIGHT WRIST ARTHROSCOPY WITH ULNA SHORTENING OSTEOTOMY;  Surgeon: Daryll Brod, MD;  Location: [place];  Service: Orthopedics;  Laterality: Right;    There were no vitals filed for this visit.  Subjective Assessment - 02/19/20 1406    Subjective  States he getting shooting pains again which he used to get. States he doesn't know how frequent he gets them but the just "do it when they want to." States the shooting  pain goes away as soon as comes but it makes him jump. States that the exercises don't make him sore and they don't really make him hurt but everything else that is going on in life makes his pain worse.    Pertinent History  Hx of cervical fusion    Limitations  Sitting;Lifting;Standing;Walking;House hold activities    How long can you stand comfortably?  1-2 minutes    How long can you walk comfortably?  1-2 minutes    Patient Stated Goals  to have less pain    Currently in Pain?  Yes    Pain Score  7     Pain Location  Back    Pain Orientation  Left;Right;Lower    Pain Descriptors / Indicators  Stabbing;Nagging    Pain Type  Chronic pain    Pain Onset  More than a month ago         [facility] PT Assessment - 02/19/20 0001      Assessment   Medical Diagnosis  s/p lumbar surgery    Referring Provider (PT)  Blanche East MD                   [facility] Adult PT Treatment/Exercise - 02/19/20 0001      Lumbar Exercises: Stretches   Passive Hamstring Stretch  Left;Right;3 reps;20 seconds   seated   Piriformis Stretch  3 reps;20 seconds;Right;Left   seated     Lumbar Exercises: Standing   Other Standing Lumbar Exercises  hip abd 4x5, 5" holds B     Other Standing Lumbar Exercises  hip extension 4x5, 5" holds B       Manual Therapy   Manual Therapy  Soft tissue mobilization    Manual therapy comments  all manual interventions performed independently of other interventions.     Soft tissue mobilization  IASTM with percussion gun to lumbar paraspinals B - felt good - 15 minutes seated .             PT Education - 02/19/20 1430    Education Details  educated patient on rationale for exercises, antatomy and pain gating.    Person(s) Educated  Patient    Methods  Explanation    Comprehension  Verbalized understanding       PT Short Term Goals - 02/15/20 1433      PT SHORT TERM GOAL #1   Title  Patient will report at least 25% improvement in overall symptoms  and/or functional ability.    Time  2    Period  Weeks    Status  On-going    Target Date  02/19/20      PT SHORT TERM GOAL #2   Title  Patient will be independent in HEP to improve functional outcomes    Time  2    Period  Weeks    Status  On-going    Target Date  02/19/20        PT Long Term Goals - 02/15/20 1433      PT LONG TERM GOAL #1   Title  Patient will be able to ambulate at least 350 feet in 2 minutes to improve functional mobility    Time  4    Period  Weeks    Status  On-going      PT LONG TERM GOAL #2   Title  Patient will report at least 50% improvement in overall symptoms and/or functional ability.    Time  4    Period  Weeks    Status  On-going      PT LONG TERM GOAL #3   Title  Patient will be able to perform 5x STS in <20 seconds to demonstrate improved functional mobility    Status  On-going            Plan - 02/19/20 1422    Clinical Impression Statement  Added IASTM with percussion gun to help gate pain with vibration stimulus. This was tolerated moderately well with change in pain perception more of a vibration perception. Transitioned to standing hip strengthening exercises to help improve hip strength and support lumbar spine. Patient to start aquatic therapy this Thursday, will follow up with tolerance of pool exercises next session.    Personal Factors and Comorbidities  Time since onset of injury/illness/exacerbation;Comorbidity 1;Comorbidity 2;Past/Current Experience    Comorbidities  s/p ACDF C2-3 09/26/19 and lumbar decompression 11/14/19    Examination-Activity Limitations  Bathing;Locomotion Level;Transfers;Sit;Sleep;Carry;Bend;Squat;Dressing;Stairs;Stand;Lift;Bed Mobility;Reach Overhead;Continence    Examination-Participation Restrictions  Cleaning;Yard Work;Community Activity;Driving;Meal Prep;Shop    Stability/Clinical Decision Making  Evolving/Moderate complexity    Rehab Potential  Poor    PT Frequency  2x / week    PT Duration  4  weeks    PT Treatment/Interventions  ADLs/Self Care Home Management;Biofeedback;Cryotherapy;Electrical Stimulation;Gait training;Neuromuscular re-education;Stair training;Functional mobility training;Moist Heat;Traction;Ultrasound;DME  Instruction;Balance training;Therapeutic exercise;Therapeutic activities;Patient/family education;Orthotic Fit/Training;Passive range of motion;Taping;Manual techniques;Joint Manipulations;Energy conservation;Spinal Manipulations;Aquatic Therapy;Dry needling    PT Next Visit Plan  massage gun as needed. standing strengthening. aquatic therapy, core strength, gentle lumbar ROM - unweighted. TRA strengthening/isometrics. Cotninue to progress core and lumbar mobility as tolerated.    PT Home Exercise Plan  02/08/20: ab set, supine clam for core, glute set, LTR    Consulted and Agree with Plan of Care  Patient       Patient will benefit from skilled therapeutic intervention in order to improve the following deficits and impairments:  Abnormal gait, Decreased range of motion, Difficulty walking, Decreased activity tolerance, Hypomobility, Impaired flexibility, Improper body mechanics, Pain, Postural dysfunction, Decreased strength, Decreased mobility, Decreased endurance  Visit Diagnosis: Chronic midline low back pain with bilateral sciatica  Difficulty in walking, not elsewhere classified  Status post lumbar surgery  Chronic bilateral low back pain without sciatica  Other abnormalities of gait and mobility     Problem List Patient Active Problem List   Diagnosis Date Noted  . SBO (small bowel obstruction) ([place]) 09/24/2017    2:44 PM, 02/19/20 Jerene Pitch, DPT Physical Therapy with [facility]  [phone] office  [place] [address] [place], [place], [address] Phone: [phone]   Fax:  [phone]  Name: [name] MRN: [id number] Date of Birth: 30-Mar-1961

## 2020-02-22 ENCOUNTER — Ambulatory Visit (HOSPITAL_COMMUNITY): Payer: BC Managed Care – PPO | Attending: Neurosurgery | Admitting: Physical Therapy

## 2020-02-22 ENCOUNTER — Encounter (HOSPITAL_COMMUNITY): Payer: Self-pay | Admitting: Physical Therapy

## 2020-02-22 DIAGNOSIS — R262 Difficulty in walking, not elsewhere classified: Secondary | ICD-10-CM

## 2020-02-22 DIAGNOSIS — M5442 Lumbago with sciatica, left side: Secondary | ICD-10-CM | POA: Diagnosis not present

## 2020-02-22 DIAGNOSIS — M545 Low back pain, unspecified: Secondary | ICD-10-CM

## 2020-02-22 DIAGNOSIS — R2689 Other abnormalities of gait and mobility: Secondary | ICD-10-CM

## 2020-02-22 DIAGNOSIS — M5441 Lumbago with sciatica, right side: Secondary | ICD-10-CM | POA: Insufficient documentation

## 2020-02-22 DIAGNOSIS — Z9889 Other specified postprocedural states: Secondary | ICD-10-CM

## 2020-02-22 DIAGNOSIS — G8929 Other chronic pain: Secondary | ICD-10-CM | POA: Diagnosis not present

## 2020-02-22 DIAGNOSIS — M542 Cervicalgia: Secondary | ICD-10-CM | POA: Insufficient documentation

## 2020-02-22 NOTE — Therapy (Signed)
[place], [place], [address] Phone: [phone]   Fax:  [phone]  Physical Therapy Treatment  Patient Details  Name: [name] MRN: [id number] Date of Birth: December 07, 1960 Referring Provider (PT): Blanche East MD   Encounter Date: 02/22/2020  PT End of Session - 02/22/20 1842    Visit Number  5    Number of Visits  8    Date for PT Re-Evaluation  03/04/20    Authorization Type  [employer] 90 visits combined PT/OT/ chiro, no auth    Authorization Time Period  02/05/2020 to 03/04/2020    Progress Note Due on Visit  8    PT Start Time  1655    PT Stop Time  1735    PT Time Calculation (min)  40 min    Activity Tolerance  Patient tolerated treatment well    Behavior During Therapy  [facility] for tasks assessed/performed       History reviewed. No pertinent past medical history.  Past Surgical History:  Procedure Laterality Date  . ANTERIOR CERVICAL DISCECTOMY  1999  . CERVICAL FUSION  2010, 2012  . [place]   right  . intestinal obstruction  2010  . OTHER SURGICAL HISTORY  1966   sternum surgery  . ribs removed as child  65  . thornwaldt cyst removed    . TONSILLECTOMY  1968  . TRANSURETHRAL RESECTION OF PROSTATE  2012  . ULNA OSTEOTOMY Right 10/09/2014   Procedure: ULNAR SHORTENING;  Surgeon: Daryll Brod, MD;  Location: [place];  Service: Orthopedics;  Laterality: Right;  . WRIST ARTHROSCOPY Right 10/09/2014   Procedure: RIGHT WRIST ARTHROSCOPY WITH ULNA SHORTENING OSTEOTOMY;  Surgeon: Daryll Brod, MD;  Location: [place];  Service: Orthopedics;  Laterality: Right;    There were no vitals filed for this visit.  Subjective Assessment - 02/22/20 1839    Subjective  Patient says his back is not better. Patient says he is getting sharp "spikes" now that he did not have before. Says he does feel the exercises are helpful, and has not been sore from them.    Pertinent History  Hx of cervical fusion    Limitations  Sitting;Lifting;Standing;Walking;House hold activities    How long can you stand comfortably?  1-2 minutes    How long can you walk comfortably?  1-2 minutes    Patient Stated Goals  to have less pain    Currently in Pain?  Yes    Pain Score  7     Pain Location  Back    Pain Orientation  Right;Left;Lower    Pain Descriptors / Indicators  Aching;Stabbing    Pain Type  Chronic pain    Pain Onset  More than a month ago    Pain Frequency  Constant                   Adult Aquatic Therapy - 02/22/20 1846      Treatment   Gait  dynamic warmup: walking, high knee march, straight leg march, sidestepping 30' 3RT each     Exercises  TA brace at wall 10x5" each, noodle push pull x20, paddle push down x20, single arm DB push down x10 each, double DB push down x 10 each                  PT Education - 02/22/20 1841    Education Details  On  benefits and mechanisms of aquatic therapy    Person(s) Educated  Patient    Methods  Explanation    Comprehension  Verbalized understanding       PT Short Term Goals - 02/15/20 1433      PT SHORT TERM GOAL #1   Title  Patient will report at least 25% improvement in overall symptoms and/or functional ability.    Time  2    Period  Weeks    Status  On-going    Target Date  02/19/20      PT SHORT TERM GOAL #2   Title  Patient will be independent in HEP to improve functional outcomes    Time  2    Period  Weeks    Status  On-going    Target Date  02/19/20        PT Long Term Goals - 02/15/20 1433      PT LONG TERM GOAL #1   Title  Patient will be able to ambulate at least 350 feet in 2 minutes to improve functional mobility    Time  4    Period  Weeks    Status  On-going      PT LONG TERM GOAL #2   Title  Patient will report at least 50% improvement in overall symptoms and/or functional ability.    Time  4    Period  Weeks    Status  On-going      PT LONG  TERM GOAL #3   Title  Patient will be able to perform 5x STS in <20 seconds to demonstrate improved functional mobility    Status  On-going            Plan - 02/22/20 1842    Clinical Impression Statement  Patient tolerated session well today. Initiated aquatic therapy this session. Patient did note some burning discomfort in RT side of lumbar during high knee marching, but pain subsides with rest. Patient did not note exacerbation of burning pain throughout rest of treatment. Patient educated on proper form and function of all added exercise. Overall good tolerance, and noted some pain reduction post session to 5/10. Patient noted pain as "weird feeling" but not as bad of pain.    Personal Factors and Comorbidities  Time since onset of injury/illness/exacerbation;Comorbidity 1;Comorbidity 2;Past/Current Experience    Comorbidities  s/p ACDF C2-3 09/26/19 and lumbar decompression 11/14/19    Examination-Activity Limitations  Bathing;Locomotion Level;Transfers;Sit;Sleep;Carry;Bend;Squat;Dressing;Stairs;Stand;Lift;Bed Mobility;Reach Overhead;Continence    Examination-Participation Restrictions  Cleaning;Yard Work;Community Activity;Driving;Meal Prep;Shop    Stability/Clinical Decision Making  Evolving/Moderate complexity    Rehab Potential  Poor    PT Frequency  2x / week    PT Duration  4 weeks    PT Treatment/Interventions  ADLs/Self Care Home Management;Biofeedback;Cryotherapy;Electrical Stimulation;Gait training;Neuromuscular re-education;Stair training;Functional mobility training;Moist Heat;Traction;Ultrasound;DME Instruction;Balance training;Therapeutic exercise;Therapeutic activities;Patient/family education;Orthotic Fit/Training;Passive range of motion;Taping;Manual techniques;Joint Manipulations;Energy conservation;Spinal Manipulations;Aquatic Therapy;Dry needling    PT Next Visit Plan  massage gun as needed. standing strengthening. aquatic therapy, core strength, gentle lumbar ROM -  unweighted. TRA strengthening/isometrics. Cotninue to progress core and lumbar mobility as tolerated.    PT Home Exercise Plan  02/08/20: ab set, supine clam for core, glute set, LTR    Consulted and Agree with Plan of Care  Patient       Patient will benefit from skilled therapeutic intervention in order to improve the following deficits and impairments:  Abnormal gait, Decreased range of motion, Difficulty walking, Decreased activity tolerance, Hypomobility, Impaired flexibility,  Improper body mechanics, Pain, Postural dysfunction, Decreased strength, Decreased mobility, Decreased endurance  Visit Diagnosis: Chronic midline low back pain with bilateral sciatica  Difficulty in walking, not elsewhere classified  Status post lumbar surgery  Chronic bilateral low back pain without sciatica  Other abnormalities of gait and mobility     Problem List Patient Active Problem List   Diagnosis Date Noted  . SBO (small bowel obstruction) ([place]) 09/24/2017   6:48 PM, 02/22/20 Josue Hector PT DPT  Physical Therapist with [facility]  [phone] [place] [address] [place], [place], [address] Phone: [phone]   Fax:  [phone]  Name: [name] MRN: [id number] Date of Birth: 1961-08-17

## 2020-02-26 ENCOUNTER — Ambulatory Visit (HOSPITAL_COMMUNITY): Payer: BC Managed Care – PPO | Admitting: Physical Therapy

## 2020-02-26 ENCOUNTER — Telehealth (HOSPITAL_COMMUNITY): Payer: Self-pay | Admitting: Physical Therapy

## 2020-02-26 NOTE — Telephone Encounter (Signed)
pt cancelled appt for today because he is stuck in [place] traffic

## 2020-02-29 ENCOUNTER — Ambulatory Visit (HOSPITAL_COMMUNITY): Payer: BC Managed Care – PPO | Admitting: Physical Therapy

## 2020-02-29 ENCOUNTER — Encounter (HOSPITAL_COMMUNITY): Payer: Self-pay | Admitting: Physical Therapy

## 2020-02-29 ENCOUNTER — Other Ambulatory Visit: Payer: Self-pay

## 2020-02-29 DIAGNOSIS — M5442 Lumbago with sciatica, left side: Secondary | ICD-10-CM | POA: Diagnosis not present

## 2020-02-29 DIAGNOSIS — M5441 Lumbago with sciatica, right side: Secondary | ICD-10-CM | POA: Diagnosis not present

## 2020-02-29 DIAGNOSIS — M545 Low back pain, unspecified: Secondary | ICD-10-CM

## 2020-02-29 DIAGNOSIS — R2689 Other abnormalities of gait and mobility: Secondary | ICD-10-CM | POA: Diagnosis not present

## 2020-02-29 DIAGNOSIS — Z9889 Other specified postprocedural states: Secondary | ICD-10-CM | POA: Diagnosis not present

## 2020-02-29 DIAGNOSIS — R262 Difficulty in walking, not elsewhere classified: Secondary | ICD-10-CM

## 2020-02-29 DIAGNOSIS — M542 Cervicalgia: Secondary | ICD-10-CM | POA: Diagnosis not present

## 2020-02-29 DIAGNOSIS — G8929 Other chronic pain: Secondary | ICD-10-CM | POA: Diagnosis not present

## 2020-02-29 NOTE — Therapy (Signed)
[place], [place], [address] Phone: [phone]   Fax:  [phone]  Physical Therapy Treatment  Patient Details  Name: [name] MRN: [id number] Date of Birth: 02-07-1961 Referring Provider (PT): Blanche East MD   Encounter Date: 02/29/2020  PT End of Session - 02/29/20 1805    Visit Number  6    Number of Visits  8    Date for PT Re-Evaluation  03/04/20    Authorization Type  BCBS 90 visits combined PT/OT/ chiro, no auth    Authorization Time Period  02/05/2020 to 03/04/2020    Progress Note Due on Visit  8    PT Start Time  1700    PT Stop Time  1745    PT Time Calculation (min)  45 min    Activity Tolerance  Patient tolerated treatment well    Behavior During Therapy  [facility] for tasks assessed/performed       History reviewed. No pertinent past medical history.  Past Surgical History:  Procedure Laterality Date  . ANTERIOR CERVICAL DISCECTOMY  1999  . CERVICAL FUSION  2010, 2012  . [place]   right  . intestinal obstruction  2010  . OTHER SURGICAL HISTORY  1966   sternum surgery  . ribs removed as child  54  . thornwaldt cyst removed    . TONSILLECTOMY  1968  . TRANSURETHRAL RESECTION OF PROSTATE  2012  . ULNA OSTEOTOMY Right 10/09/2014   Procedure: ULNAR SHORTENING;  Surgeon: Daryll Brod, MD;  Location: [place];  Service: Orthopedics;  Laterality: Right;  . WRIST ARTHROSCOPY Right 10/09/2014   Procedure: RIGHT WRIST ARTHROSCOPY WITH ULNA SHORTENING OSTEOTOMY;  Surgeon: Daryll Brod, MD;  Location: [place];  Service: Orthopedics;  Laterality: Right;    There were no vitals filed for this visit.  Subjective Assessment - 02/29/20 1804    Subjective  Patient says he was in a lot of pain with his back earlier this week form a long car ride he took over the weekend. Says his back is feeling a little better today but still hurting pretty bad.    Pertinent History  Hx of cervical fusion    Limitations  Sitting;Lifting;Standing;Walking;House hold activities    How long can you stand comfortably?  1-2 minutes    How long can you walk comfortably?  1-2 minutes    Patient Stated Goals  to have less pain    Currently in Pain?  Yes    Pain Score  8     Pain Location  Back    Pain Orientation  Right;Left;Lower;Posterior    Pain Descriptors / Indicators  Aching;Stabbing    Pain Type  Chronic pain    Pain Onset  More than a month ago                   Adult Aquatic Therapy - 02/29/20 1808      Treatment   Gait  dynamic warmup: walking, high knee march, straight leg march, sidestepping 30' 3RT each     Exercises  Squats x20, TA brace at wall 10x5" each, noodle push pull x20, paddle push down x20, noodle twists x20, paddle twists x20 single arm DB push down x10 each, double DB push down x 10 each                   PT Short Term Goals - 02/15/20 1433  PT SHORT TERM GOAL #1   Title  Patient will report at least 25% improvement in overall symptoms and/or functional ability.    Time  2    Period  Weeks    Status  On-going    Target Date  02/19/20      PT SHORT TERM GOAL #2   Title  Patient will be independent in HEP to improve functional outcomes    Time  2    Period  Weeks    Status  On-going    Target Date  02/19/20        PT Long Term Goals - 02/15/20 1433      PT LONG TERM GOAL #1   Title  Patient will be able to ambulate at least 350 feet in 2 minutes to improve functional mobility    Time  4    Period  Weeks    Status  On-going      PT LONG TERM GOAL #2   Title  Patient will report at least 50% improvement in overall symptoms and/or functional ability.    Time  4    Period  Weeks    Status  On-going      PT LONG TERM GOAL #3   Title  Patient will be able to perform 5x STS in <20 seconds to demonstrate improved functional mobility    Status  On-going            Plan -  02/29/20 1806    Clinical Impression Statement  Patient tolerated session well today with no increased complaint of pain. Pateitn cued on performing abdominal and oblique exercise in pain free ROM. Patient cues on maintaining TA activation during core exercise and squatting for improved lumbar stability. Patient able to progress core strenghening in transverse plane with no increased complaint of pain, some pain reduction noted post session.    Personal Factors and Comorbidities  Time since onset of injury/illness/exacerbation;Comorbidity 1;Comorbidity 2;Past/Current Experience    Comorbidities  s/p ACDF C2-3 09/26/19 and lumbar decompression 11/14/19    Examination-Activity Limitations  Bathing;Locomotion Level;Transfers;Sit;Sleep;Carry;Bend;Squat;Dressing;Stairs;Stand;Lift;Bed Mobility;Reach Overhead;Continence    Examination-Participation Restrictions  Cleaning;Yard Work;Community Activity;Driving;Meal Prep;Shop    Stability/Clinical Decision Making  Evolving/Moderate complexity    Rehab Potential  Poor    PT Frequency  2x / week    PT Duration  4 weeks    PT Treatment/Interventions  ADLs/Self Care Home Management;Biofeedback;Cryotherapy;Electrical Stimulation;Gait training;Neuromuscular re-education;Stair training;Functional mobility training;Moist Heat;Traction;Ultrasound;DME Instruction;Balance training;Therapeutic exercise;Therapeutic activities;Patient/family education;Orthotic Fit/Training;Passive range of motion;Taping;Manual techniques;Joint Manipulations;Energy conservation;Spinal Manipulations;Aquatic Therapy;Dry needling    PT Next Visit Plan  massage gun as needed. standing strengthening. aquatic therapy, core strength, gentle lumbar ROM - unweighted. TRA strengthening/isometrics. Cotninue to progress core and lumbar mobility as tolerated.    PT Home Exercise Plan  02/08/20: ab set, supine clam for core, glute set, LTR    Consulted and Agree with Plan of Care  Patient       Patient will  benefit from skilled therapeutic intervention in order to improve the following deficits and impairments:  Abnormal gait, Decreased range of motion, Difficulty walking, Decreased activity tolerance, Hypomobility, Impaired flexibility, Improper body mechanics, Pain, Postural dysfunction, Decreased strength, Decreased mobility, Decreased endurance  Visit Diagnosis: Chronic midline low back pain with bilateral sciatica  Difficulty in walking, not elsewhere classified  Status post lumbar surgery  Chronic bilateral low back pain without sciatica  Other abnormalities of gait and mobility     Problem List Patient Active Problem List  Diagnosis Date Noted  . SBO (small bowel obstruction) ([place]) 09/24/2017   6:10 PM, 02/29/20 Josue Hector PT DPT  Physical Therapist with [facility]  [phone] [place] [address] [place], [place], [address] Phone: [phone]   Fax:  [phone]  Name: CRAWFORD GUADAGNINO MRN: [id number] Date of Birth: 07/02/1961

## 2020-03-02 DIAGNOSIS — Z23 Encounter for immunization: Secondary | ICD-10-CM | POA: Diagnosis not present

## 2020-03-04 ENCOUNTER — Other Ambulatory Visit: Payer: Self-pay

## 2020-03-04 ENCOUNTER — Ambulatory Visit (HOSPITAL_COMMUNITY): Payer: BC Managed Care – PPO | Admitting: Physical Therapy

## 2020-03-04 ENCOUNTER — Encounter (HOSPITAL_COMMUNITY): Payer: Self-pay | Admitting: Physical Therapy

## 2020-03-04 DIAGNOSIS — M5441 Lumbago with sciatica, right side: Secondary | ICD-10-CM | POA: Diagnosis not present

## 2020-03-04 DIAGNOSIS — R339 Retention of urine, unspecified: Secondary | ICD-10-CM | POA: Diagnosis not present

## 2020-03-04 DIAGNOSIS — Z9889 Other specified postprocedural states: Secondary | ICD-10-CM | POA: Diagnosis not present

## 2020-03-04 DIAGNOSIS — M545 Other chronic pain: Secondary | ICD-10-CM

## 2020-03-04 DIAGNOSIS — G8929 Other chronic pain: Secondary | ICD-10-CM | POA: Diagnosis not present

## 2020-03-04 DIAGNOSIS — R2689 Other abnormalities of gait and mobility: Secondary | ICD-10-CM

## 2020-03-04 DIAGNOSIS — M5442 Lumbago with sciatica, left side: Secondary | ICD-10-CM | POA: Diagnosis not present

## 2020-03-04 DIAGNOSIS — M542 Cervicalgia: Secondary | ICD-10-CM

## 2020-03-04 DIAGNOSIS — N312 Flaccid neuropathic bladder, not elsewhere classified: Secondary | ICD-10-CM | POA: Diagnosis not present

## 2020-03-04 DIAGNOSIS — R262 Difficulty in walking, not elsewhere classified: Secondary | ICD-10-CM | POA: Diagnosis not present

## 2020-03-04 NOTE — Therapy (Signed)
[place] [address] [place], [place], [address] Phone: [phone]   Fax:  [phone]  Physical Therapy Treatment and Discharge   Patient Details  Name: [name] MRN: [id number] Date of Birth: [birth year] Referring Provider (PT): Blanche East MD  PHYSICAL THERAPY DISCHARGE SUMMARY  Visits from Start of Care: 7  Current functional level related to goals / functional outcomes: 1/2 STG met and 1/3 LTG MET   Remaining deficits: Continue pain    Education / Equipment: See below Plan: Patient agrees to discharge.  Patient goals were partially met. Patient is being discharged due to the patient's request.  ?????       Encounter Date: 03/04/2020  PT End of Session - 03/04/20 1317    Visit Number  7    Number of Visits  8    Date for PT Re-Evaluation  03/04/20    Authorization Type  BCBS 90 visits combined PT/OT/ chiro, no auth    Authorization Time Period  02/05/2020 to 03/04/2020    Progress Note Due on Visit  8    PT Start Time  6812    PT Stop Time  1357    PT Time Calculation (min)  40 min    Activity Tolerance  Patient tolerated treatment well    Behavior During Therapy  [facility] for tasks assessed/performed       History reviewed. No pertinent past medical history.  Past Surgical History:  Procedure Laterality Date  . ANTERIOR CERVICAL DISCECTOMY  1999  . CERVICAL FUSION  2010, 2012  . [place]   right  . intestinal obstruction  2010  . OTHER SURGICAL HISTORY  1966   sternum surgery  . ribs removed as child  51  . thornwaldt cyst removed    . TONSILLECTOMY  1968  . TRANSURETHRAL RESECTION OF PROSTATE  2012  . ULNA OSTEOTOMY Right 10/09/2014   Procedure: ULNAR SHORTENING;  Surgeon: Daryll Brod, MD;  Location: [place];  Service: Orthopedics;  Laterality: Right;  . WRIST ARTHROSCOPY Right 10/09/2014   Procedure: RIGHT WRIST ARTHROSCOPY WITH ULNA SHORTENING OSTEOTOMY;   Surgeon: Daryll Brod, MD;  Location: [place];  Service: Orthopedics;  Laterality: Right;    There were no vitals filed for this visit.  Subjective Assessment - 03/04/20 1352    Subjective  States the pool was alright, but he didn't feel like his back lightened up at all. States he was able to do his exercises alright in the pool. States that one exercise really bothering his right arm so they had to start. States he hadn't had any back relief since aquatic therapy and since the start of PT.    Pertinent History  Hx of cervical fusion    Limitations  Sitting;Lifting;Standing;Walking;House hold activities    How long can you stand comfortably?  1-2 minutes    How long can you walk comfortably?  1-2 minutes    Patient Stated Goals  to have less pain    Currently in Pain?  Yes    Pain Score  8     Pain Location  Back    Pain Descriptors / Indicators  Aching;Stabbing    Pain Type  Chronic pain    Pain Onset  More than a month ago         [facility] PT Assessment - 03/04/20 0001      Assessment   Medical Diagnosis  s/p lumbar surgery  Referring Provider (PT)  Blanche East MD      Observation/Other Assessments   Focus on Therapeutic Outcomes (FOTO)   57% limited   was 80% limited     AROM   Lumbar Flexion  75% limited    Lumbar Extension  100% limited - increased pain on right side    Lumbar - Right Side Bend  75% limited - increased right side pain    Lumbar - Left Side Bend  75% limited      Transfers   Transfers  Sit to Stand;Stand to Sit    Five time sit to stand comments   22.6 seconds    was 24.6 seconds     Ambulation/Gait   Ambulation/Gait  Yes    Ambulation/Gait Assistance  7: Independent    Ambulation Distance (Feet)  380 Feet   was 302   Assistive device  None    Gait Pattern  Decreased hip/knee flexion - right;Decreased hip/knee flexion - left;Decreased trunk rotation    Ambulation Surface  Level;Indoor    Gait Comments  2MW - easier on ankles  but tougher on back                            PT Education - 03/04/20 1352    Education Details  on current presentation, progress made, HEP and using breathwork to help wiht chronic pain and rib/back mobilty    Person(s) Educated  Patient    Methods  Explanation    Comprehension  Verbalized understanding       PT Short Term Goals - 03/04/20 1327      PT SHORT TERM GOAL #1   Title  Patient will report at least 25% improvement in overall symptoms and/or functional ability.    Baseline  4/12 0% limited    Time  2    Period  Weeks    Status  Not Met    Target Date  02/19/20      PT SHORT TERM GOAL #2   Title  Patient will be independent in HEP to improve functional outcomes    Baseline  able to perform    Time  2    Period  Weeks    Status  Achieved    Target Date  02/19/20        PT Long Term Goals - 03/04/20 1328      PT LONG TERM GOAL #1   Title  Patient will be able to ambulate at least 350 feet in 2 minutes to improve functional mobility    Baseline  4/12 380 feet    Time  4    Period  Weeks    Status  Achieved      PT LONG TERM GOAL #2   Title  Patient will report at least 50% improvement in overall symptoms and/or functional ability.    Baseline  4/21 0% better    Time  4    Period  Weeks    Status  Not Met      PT LONG TERM GOAL #3   Title  Patient will be able to perform 5x STS in <20 seconds to demonstrate improved functional mobility    Baseline  4/12 22.6 seconds    Status  Not Met            Plan - 03/04/20 1353    Clinical Impression Statement  Patient to discharge from PT on this date.  He reports no change in symptoms. Patient has demonstrated an improvement in overall function and in FOTO score but pain continues. Answered all questions and educated patient in possible benefits from breathing exercises. Instructed patient to follow up with MD if he thought appropriate to figure out next step of care for chronic pain.     Personal Factors and Comorbidities  Time since onset of injury/illness/exacerbation;Comorbidity 1;Comorbidity 2;Past/Current Experience    Comorbidities  s/p ACDF C2-3 09/26/19 and lumbar decompression 11/14/19    Examination-Activity Limitations  Bathing;Locomotion Level;Transfers;Sit;Sleep;Carry;Bend;Squat;Dressing;Stairs;Stand;Lift;Bed Mobility;Reach Overhead;Continence    Examination-Participation Restrictions  Cleaning;Yard Work;Community Activity;Driving;Meal Prep;Shop    Stability/Clinical Decision Making  Evolving/Moderate complexity    Rehab Potential  Poor    PT Frequency  2x / week    PT Duration  4 weeks    PT Treatment/Interventions  ADLs/Self Care Home Management;Biofeedback;Cryotherapy;Electrical Stimulation;Gait training;Neuromuscular re-education;Stair training;Functional mobility training;Moist Heat;Traction;Ultrasound;DME Instruction;Balance training;Therapeutic exercise;Therapeutic activities;Patient/family education;Orthotic Fit/Training;Passive range of motion;Taping;Manual techniques;Joint Manipulations;Energy conservation;Spinal Manipulations;Aquatic Therapy;Dry needling    PT Next Visit Plan  pt to dishcarge from PT to HEP    PT Home Exercise Plan  02/08/20: ab set, supine clam for core, glute set, LTR    Consulted and Agree with Plan of Care  Patient       Patient will benefit from skilled therapeutic intervention in order to improve the following deficits and impairments:  Abnormal gait, Decreased range of motion, Difficulty walking, Decreased activity tolerance, Hypomobility, Impaired flexibility, Improper body mechanics, Pain, Postural dysfunction, Decreased strength, Decreased mobility, Decreased endurance  Visit Diagnosis: Chronic midline low back pain with bilateral sciatica  Difficulty in walking, not elsewhere classified  Status post lumbar surgery  Chronic bilateral low back pain without sciatica  Other abnormalities of gait and  mobility  Cervicalgia     Problem List Patient Active Problem List   Diagnosis Date Noted  . SBO (small bowel obstruction) ([place]) 09/24/2017    2:49 PM, 03/04/20 Jerene Pitch, DPT Physical Therapy with [facility]  [phone] office   [place] [address] [place], [place], [address] Phone: [phone]   Fax:  [phone]  Name: MAYCEN DEGREGORY MRN: [id number] Date of Birth: 02/14/1961

## 2020-03-12 DIAGNOSIS — R339 Retention of urine, unspecified: Secondary | ICD-10-CM | POA: Diagnosis not present

## 2020-03-26 DIAGNOSIS — J383 Other diseases of vocal cords: Secondary | ICD-10-CM | POA: Diagnosis not present

## 2020-03-26 DIAGNOSIS — R1312 Dysphagia, oropharyngeal phase: Secondary | ICD-10-CM | POA: Diagnosis not present

## 2020-03-26 DIAGNOSIS — K219 Gastro-esophageal reflux disease without esophagitis: Secondary | ICD-10-CM | POA: Diagnosis not present

## 2020-03-26 DIAGNOSIS — R490 Dysphonia: Secondary | ICD-10-CM | POA: Diagnosis not present

## 2020-04-12 DIAGNOSIS — R339 Retention of urine, unspecified: Secondary | ICD-10-CM | POA: Diagnosis not present

## 2020-05-13 DIAGNOSIS — R339 Retention of urine, unspecified: Secondary | ICD-10-CM | POA: Diagnosis not present

## 2020-06-06 DIAGNOSIS — M5440 Lumbago with sciatica, unspecified side: Secondary | ICD-10-CM | POA: Diagnosis not present

## 2020-06-06 DIAGNOSIS — G8929 Other chronic pain: Secondary | ICD-10-CM | POA: Diagnosis not present

## 2020-06-06 DIAGNOSIS — M4322 Fusion of spine, cervical region: Secondary | ICD-10-CM | POA: Diagnosis not present

## 2020-06-06 DIAGNOSIS — Z9889 Other specified postprocedural states: Secondary | ICD-10-CM | POA: Diagnosis not present

## 2020-06-25 DIAGNOSIS — Z9889 Other specified postprocedural states: Secondary | ICD-10-CM | POA: Diagnosis not present

## 2020-06-25 DIAGNOSIS — M5416 Radiculopathy, lumbar region: Secondary | ICD-10-CM | POA: Diagnosis not present

## 2020-06-25 DIAGNOSIS — M4322 Fusion of spine, cervical region: Secondary | ICD-10-CM | POA: Diagnosis not present

## 2020-06-25 DIAGNOSIS — G8929 Other chronic pain: Secondary | ICD-10-CM | POA: Diagnosis not present

## 2020-06-25 DIAGNOSIS — M5440 Lumbago with sciatica, unspecified side: Secondary | ICD-10-CM | POA: Diagnosis not present

## 2020-06-25 DIAGNOSIS — M47817 Spondylosis without myelopathy or radiculopathy, lumbosacral region: Secondary | ICD-10-CM | POA: Diagnosis not present

## 2020-07-22 DIAGNOSIS — M5416 Radiculopathy, lumbar region: Secondary | ICD-10-CM | POA: Diagnosis not present

## 2020-08-05 DIAGNOSIS — R109 Unspecified abdominal pain: Secondary | ICD-10-CM | POA: Diagnosis not present

## 2020-08-05 DIAGNOSIS — N390 Urinary tract infection, site not specified: Secondary | ICD-10-CM | POA: Diagnosis not present

## 2020-08-12 DIAGNOSIS — R339 Retention of urine, unspecified: Secondary | ICD-10-CM | POA: Diagnosis not present

## 2020-08-27 DIAGNOSIS — M5030 Other cervical disc degeneration, unspecified cervical region: Secondary | ICD-10-CM | POA: Diagnosis not present

## 2020-08-27 DIAGNOSIS — Z6830 Body mass index (BMI) 30.0-30.9, adult: Secondary | ICD-10-CM | POA: Diagnosis not present

## 2020-08-27 DIAGNOSIS — R39198 Other difficulties with micturition: Secondary | ICD-10-CM | POA: Diagnosis not present

## 2020-08-27 DIAGNOSIS — G894 Chronic pain syndrome: Secondary | ICD-10-CM | POA: Diagnosis not present

## 2020-08-27 DIAGNOSIS — Z23 Encounter for immunization: Secondary | ICD-10-CM | POA: Diagnosis not present

## 2020-09-09 DIAGNOSIS — R339 Retention of urine, unspecified: Secondary | ICD-10-CM | POA: Diagnosis not present

## 2020-09-09 DIAGNOSIS — N312 Flaccid neuropathic bladder, not elsewhere classified: Secondary | ICD-10-CM | POA: Diagnosis not present

## 2020-09-12 DIAGNOSIS — M47816 Spondylosis without myelopathy or radiculopathy, lumbar region: Secondary | ICD-10-CM | POA: Diagnosis not present

## 2020-09-12 DIAGNOSIS — M5416 Radiculopathy, lumbar region: Secondary | ICD-10-CM | POA: Diagnosis not present

## 2020-09-16 DIAGNOSIS — R338 Other retention of urine: Secondary | ICD-10-CM | POA: Diagnosis not present

## 2020-09-16 DIAGNOSIS — R339 Retention of urine, unspecified: Secondary | ICD-10-CM | POA: Diagnosis not present

## 2020-09-16 DIAGNOSIS — N312 Flaccid neuropathic bladder, not elsewhere classified: Secondary | ICD-10-CM | POA: Diagnosis not present

## 2020-10-04 DIAGNOSIS — M47816 Spondylosis without myelopathy or radiculopathy, lumbar region: Secondary | ICD-10-CM | POA: Diagnosis not present

## 2020-10-14 DIAGNOSIS — N312 Flaccid neuropathic bladder, not elsewhere classified: Secondary | ICD-10-CM | POA: Diagnosis not present

## 2020-10-14 DIAGNOSIS — R339 Retention of urine, unspecified: Secondary | ICD-10-CM | POA: Diagnosis not present

## 2020-11-07 DIAGNOSIS — R339 Retention of urine, unspecified: Secondary | ICD-10-CM | POA: Diagnosis not present

## 2020-11-14 DIAGNOSIS — N3000 Acute cystitis without hematuria: Secondary | ICD-10-CM | POA: Diagnosis not present

## 2020-11-14 DIAGNOSIS — R1030 Lower abdominal pain, unspecified: Secondary | ICD-10-CM | POA: Diagnosis not present

## 2020-11-14 DIAGNOSIS — R109 Unspecified abdominal pain: Secondary | ICD-10-CM | POA: Diagnosis not present

## 2020-11-17 DIAGNOSIS — M7061 Trochanteric bursitis, right hip: Secondary | ICD-10-CM | POA: Diagnosis not present

## 2020-12-06 DIAGNOSIS — R339 Retention of urine, unspecified: Secondary | ICD-10-CM | POA: Diagnosis not present

## 2020-12-10 DIAGNOSIS — Z681 Body mass index (BMI) 19 or less, adult: Secondary | ICD-10-CM | POA: Diagnosis not present

## 2020-12-10 DIAGNOSIS — J029 Acute pharyngitis, unspecified: Secondary | ICD-10-CM | POA: Diagnosis not present

## 2020-12-16 DIAGNOSIS — U071 COVID-19: Secondary | ICD-10-CM | POA: Diagnosis not present

## 2020-12-24 DIAGNOSIS — G8929 Other chronic pain: Secondary | ICD-10-CM | POA: Diagnosis not present

## 2020-12-24 DIAGNOSIS — M5440 Lumbago with sciatica, unspecified side: Secondary | ICD-10-CM | POA: Diagnosis not present

## 2021-01-03 DIAGNOSIS — R339 Retention of urine, unspecified: Secondary | ICD-10-CM | POA: Diagnosis not present

## 2021-01-27 DIAGNOSIS — Z01812 Encounter for preprocedural laboratory examination: Secondary | ICD-10-CM | POA: Diagnosis not present

## 2021-01-27 DIAGNOSIS — M48061 Spinal stenosis, lumbar region without neurogenic claudication: Secondary | ICD-10-CM | POA: Diagnosis not present

## 2021-01-27 DIAGNOSIS — Z20822 Contact with and (suspected) exposure to covid-19: Secondary | ICD-10-CM | POA: Diagnosis not present

## 2021-01-27 DIAGNOSIS — M5137 Other intervertebral disc degeneration, lumbosacral region: Secondary | ICD-10-CM | POA: Diagnosis not present

## 2021-01-27 DIAGNOSIS — M4316 Spondylolisthesis, lumbar region: Secondary | ICD-10-CM | POA: Diagnosis not present

## 2021-01-27 DIAGNOSIS — Z01818 Encounter for other preprocedural examination: Secondary | ICD-10-CM | POA: Diagnosis not present

## 2021-01-27 DIAGNOSIS — M4313 Spondylolisthesis, cervicothoracic region: Secondary | ICD-10-CM | POA: Diagnosis not present

## 2021-01-30 DIAGNOSIS — R339 Retention of urine, unspecified: Secondary | ICD-10-CM | POA: Diagnosis not present

## 2021-02-11 DIAGNOSIS — Z6829 Body mass index (BMI) 29.0-29.9, adult: Secondary | ICD-10-CM | POA: Diagnosis not present

## 2021-02-11 DIAGNOSIS — R413 Other amnesia: Secondary | ICD-10-CM | POA: Diagnosis not present

## 2021-02-11 DIAGNOSIS — G894 Chronic pain syndrome: Secondary | ICD-10-CM | POA: Diagnosis not present

## 2021-02-11 DIAGNOSIS — F0390 Unspecified dementia without behavioral disturbance: Secondary | ICD-10-CM | POA: Diagnosis not present

## 2021-02-11 DIAGNOSIS — E663 Overweight: Secondary | ICD-10-CM | POA: Diagnosis not present

## 2021-02-11 DIAGNOSIS — E7849 Other hyperlipidemia: Secondary | ICD-10-CM | POA: Diagnosis not present

## 2021-02-11 DIAGNOSIS — Z79899 Other long term (current) drug therapy: Secondary | ICD-10-CM | POA: Diagnosis not present

## 2021-02-21 DIAGNOSIS — R339 Retention of urine, unspecified: Secondary | ICD-10-CM | POA: Diagnosis not present

## 2021-02-21 DIAGNOSIS — N401 Enlarged prostate with lower urinary tract symptoms: Secondary | ICD-10-CM | POA: Diagnosis not present

## 2021-02-21 DIAGNOSIS — N39498 Other specified urinary incontinence: Secondary | ICD-10-CM | POA: Diagnosis not present

## 2021-03-05 DIAGNOSIS — R202 Paresthesia of skin: Secondary | ICD-10-CM | POA: Diagnosis not present

## 2021-03-05 DIAGNOSIS — M5441 Lumbago with sciatica, right side: Secondary | ICD-10-CM | POA: Diagnosis not present

## 2021-03-05 DIAGNOSIS — M542 Cervicalgia: Secondary | ICD-10-CM | POA: Diagnosis not present

## 2021-03-05 DIAGNOSIS — M5442 Lumbago with sciatica, left side: Secondary | ICD-10-CM | POA: Diagnosis not present

## 2021-03-05 DIAGNOSIS — G8929 Other chronic pain: Secondary | ICD-10-CM | POA: Diagnosis not present

## 2021-03-05 DIAGNOSIS — M5416 Radiculopathy, lumbar region: Secondary | ICD-10-CM | POA: Diagnosis not present

## 2021-03-05 DIAGNOSIS — Z01818 Encounter for other preprocedural examination: Secondary | ICD-10-CM | POA: Diagnosis not present

## 2021-03-05 DIAGNOSIS — R200 Anesthesia of skin: Secondary | ICD-10-CM | POA: Diagnosis not present

## 2021-03-05 DIAGNOSIS — N320 Bladder-neck obstruction: Secondary | ICD-10-CM | POA: Diagnosis not present

## 2021-03-05 DIAGNOSIS — K56609 Unspecified intestinal obstruction, unspecified as to partial versus complete obstruction: Secondary | ICD-10-CM | POA: Diagnosis not present

## 2021-03-13 DIAGNOSIS — R339 Retention of urine, unspecified: Secondary | ICD-10-CM | POA: Diagnosis not present

## 2021-03-13 DIAGNOSIS — N401 Enlarged prostate with lower urinary tract symptoms: Secondary | ICD-10-CM | POA: Diagnosis not present

## 2021-03-14 DIAGNOSIS — R972 Elevated prostate specific antigen [PSA]: Secondary | ICD-10-CM | POA: Diagnosis not present

## 2021-03-14 DIAGNOSIS — R339 Retention of urine, unspecified: Secondary | ICD-10-CM | POA: Diagnosis not present

## 2021-03-14 DIAGNOSIS — N401 Enlarged prostate with lower urinary tract symptoms: Secondary | ICD-10-CM | POA: Diagnosis not present

## 2021-03-15 DIAGNOSIS — M5117 Intervertebral disc disorders with radiculopathy, lumbosacral region: Secondary | ICD-10-CM | POA: Diagnosis not present

## 2021-03-15 DIAGNOSIS — M5136 Other intervertebral disc degeneration, lumbar region: Secondary | ICD-10-CM | POA: Diagnosis not present

## 2021-03-15 DIAGNOSIS — Z79899 Other long term (current) drug therapy: Secondary | ICD-10-CM | POA: Diagnosis not present

## 2021-03-15 DIAGNOSIS — Z20822 Contact with and (suspected) exposure to covid-19: Secondary | ICD-10-CM | POA: Diagnosis not present

## 2021-03-15 DIAGNOSIS — M5137 Other intervertebral disc degeneration, lumbosacral region: Secondary | ICD-10-CM | POA: Diagnosis not present

## 2021-03-15 DIAGNOSIS — M47814 Spondylosis without myelopathy or radiculopathy, thoracic region: Secondary | ICD-10-CM | POA: Diagnosis not present

## 2021-03-15 DIAGNOSIS — M5030 Other cervical disc degeneration, unspecified cervical region: Secondary | ICD-10-CM | POA: Diagnosis not present

## 2021-03-15 DIAGNOSIS — Z885 Allergy status to narcotic agent status: Secondary | ICD-10-CM | POA: Diagnosis not present

## 2021-03-15 DIAGNOSIS — M5417 Radiculopathy, lumbosacral region: Secondary | ICD-10-CM | POA: Diagnosis not present

## 2021-03-15 DIAGNOSIS — M5416 Radiculopathy, lumbar region: Secondary | ICD-10-CM | POA: Diagnosis not present

## 2021-03-15 DIAGNOSIS — G8929 Other chronic pain: Secondary | ICD-10-CM | POA: Diagnosis not present

## 2021-03-15 DIAGNOSIS — M47816 Spondylosis without myelopathy or radiculopathy, lumbar region: Secondary | ICD-10-CM | POA: Diagnosis not present

## 2021-03-15 DIAGNOSIS — Z981 Arthrodesis status: Secondary | ICD-10-CM | POA: Diagnosis not present

## 2021-03-15 DIAGNOSIS — M47812 Spondylosis without myelopathy or radiculopathy, cervical region: Secondary | ICD-10-CM | POA: Diagnosis not present

## 2021-03-15 DIAGNOSIS — K219 Gastro-esophageal reflux disease without esophagitis: Secondary | ICD-10-CM | POA: Diagnosis not present

## 2021-03-27 DIAGNOSIS — R339 Retention of urine, unspecified: Secondary | ICD-10-CM | POA: Diagnosis not present

## 2021-03-28 DIAGNOSIS — L539 Erythematous condition, unspecified: Secondary | ICD-10-CM | POA: Diagnosis not present

## 2021-03-28 DIAGNOSIS — R109 Unspecified abdominal pain: Secondary | ICD-10-CM | POA: Diagnosis not present

## 2021-03-28 DIAGNOSIS — L7622 Postprocedural hemorrhage and hematoma of skin and subcutaneous tissue following other procedure: Secondary | ICD-10-CM | POA: Diagnosis not present

## 2021-03-28 DIAGNOSIS — Z981 Arthrodesis status: Secondary | ICD-10-CM | POA: Diagnosis not present

## 2021-03-28 DIAGNOSIS — Z9889 Other specified postprocedural states: Secondary | ICD-10-CM | POA: Diagnosis not present

## 2021-03-28 DIAGNOSIS — R238 Other skin changes: Secondary | ICD-10-CM | POA: Diagnosis not present

## 2021-03-28 DIAGNOSIS — T8141XA Infection following a procedure, superficial incisional surgical site, initial encounter: Secondary | ICD-10-CM | POA: Diagnosis not present

## 2021-04-08 DIAGNOSIS — Z4802 Encounter for removal of sutures: Secondary | ICD-10-CM | POA: Diagnosis not present

## 2021-04-08 DIAGNOSIS — Z4889 Encounter for other specified surgical aftercare: Secondary | ICD-10-CM | POA: Diagnosis not present

## 2021-04-15 DIAGNOSIS — Z4889 Encounter for other specified surgical aftercare: Secondary | ICD-10-CM | POA: Diagnosis not present

## 2021-04-23 DIAGNOSIS — R1310 Dysphagia, unspecified: Secondary | ICD-10-CM | POA: Diagnosis not present

## 2021-04-23 DIAGNOSIS — R410 Disorientation, unspecified: Secondary | ICD-10-CM | POA: Diagnosis not present

## 2021-04-23 DIAGNOSIS — G4733 Obstructive sleep apnea (adult) (pediatric): Secondary | ICD-10-CM | POA: Diagnosis not present

## 2021-04-23 DIAGNOSIS — G3184 Mild cognitive impairment, so stated: Secondary | ICD-10-CM | POA: Diagnosis not present

## 2021-04-25 DIAGNOSIS — R339 Retention of urine, unspecified: Secondary | ICD-10-CM | POA: Diagnosis not present

## 2021-04-30 DIAGNOSIS — R319 Hematuria, unspecified: Secondary | ICD-10-CM | POA: Diagnosis not present

## 2021-04-30 DIAGNOSIS — Z1331 Encounter for screening for depression: Secondary | ICD-10-CM | POA: Diagnosis not present

## 2021-04-30 DIAGNOSIS — N3000 Acute cystitis without hematuria: Secondary | ICD-10-CM | POA: Diagnosis not present

## 2021-04-30 DIAGNOSIS — N410 Acute prostatitis: Secondary | ICD-10-CM | POA: Diagnosis not present

## 2021-04-30 DIAGNOSIS — Z9889 Other specified postprocedural states: Secondary | ICD-10-CM | POA: Diagnosis not present

## 2021-04-30 DIAGNOSIS — Z6829 Body mass index (BMI) 29.0-29.9, adult: Secondary | ICD-10-CM | POA: Diagnosis not present

## 2021-04-30 DIAGNOSIS — N319 Neuromuscular dysfunction of bladder, unspecified: Secondary | ICD-10-CM | POA: Diagnosis not present

## 2021-04-30 DIAGNOSIS — E663 Overweight: Secondary | ICD-10-CM | POA: Diagnosis not present

## 2021-05-06 DIAGNOSIS — R972 Elevated prostate specific antigen [PSA]: Secondary | ICD-10-CM | POA: Diagnosis not present

## 2021-05-07 ENCOUNTER — Other Ambulatory Visit (HOSPITAL_BASED_OUTPATIENT_CLINIC_OR_DEPARTMENT_OTHER): Payer: Self-pay

## 2021-05-07 DIAGNOSIS — G4733 Obstructive sleep apnea (adult) (pediatric): Secondary | ICD-10-CM

## 2021-05-09 ENCOUNTER — Other Ambulatory Visit: Payer: Self-pay

## 2021-05-09 ENCOUNTER — Ambulatory Visit: Payer: BC Managed Care – PPO | Attending: Neurology | Admitting: Neurology

## 2021-05-09 DIAGNOSIS — G4733 Obstructive sleep apnea (adult) (pediatric): Secondary | ICD-10-CM | POA: Insufficient documentation

## 2021-05-09 DIAGNOSIS — Z79899 Other long term (current) drug therapy: Secondary | ICD-10-CM | POA: Diagnosis not present

## 2021-05-14 ENCOUNTER — Ambulatory Visit (HOSPITAL_COMMUNITY): Payer: BC Managed Care – PPO | Attending: Adult Health Nurse Practitioner | Admitting: Physical Therapy

## 2021-05-14 ENCOUNTER — Encounter (HOSPITAL_COMMUNITY): Payer: Self-pay | Admitting: Physical Therapy

## 2021-05-14 ENCOUNTER — Other Ambulatory Visit: Payer: Self-pay

## 2021-05-14 DIAGNOSIS — R2689 Other abnormalities of gait and mobility: Secondary | ICD-10-CM | POA: Diagnosis not present

## 2021-05-14 DIAGNOSIS — M545 Low back pain, unspecified: Secondary | ICD-10-CM | POA: Diagnosis not present

## 2021-05-14 NOTE — Therapy (Signed)
[place], [place], [address] Phone: [phone]   Fax:  [phone]  Physical Therapy Evaluation  Patient Details  Name: [name] MRN: [id number] Date of Birth: 04-17-1961 Referring Provider (PT): Rema Jasmine NP   Encounter Date: 05/14/2021   PT End of Session - 05/14/21 1445     Visit Number 1    Number of Visits 8    Date for PT Re-Evaluation 06/11/21    Authorization Type [facility], BCBS COMM PPO secondary    PT Start Time 1432    PT Stop Time 1515    PT Time Calculation (min) 43 min    Activity Tolerance Patient tolerated treatment well    Behavior During Therapy [facility] for tasks assessed/performed             History reviewed. No pertinent past medical history.  Past Surgical History:  Procedure Laterality Date   ANTERIOR CERVICAL DISCECTOMY  1999   CERVICAL FUSION  2010, 2012   [place]   right   intestinal obstruction  2010   OTHER SURGICAL HISTORY  1966   sternum surgery   ribs removed as child  1967   thornwaldt cyst removed     [place]  2012   ULNA OSTEOTOMY Right 10/09/2014   Procedure: ULNAR SHORTENING;  Surgeon: Daryll Brod, MD;  Location: [place];  Service: Orthopedics;  Laterality: Right;   WRIST ARTHROSCOPY Right 10/09/2014   Procedure: RIGHT WRIST ARTHROSCOPY WITH ULNA SHORTENING OSTEOTOMY;  Surgeon: Daryll Brod, MD;  Location: [place];  Service: Orthopedics;  Laterality: Right;    There were no vitals filed for this visit.    Subjective Assessment - 05/14/21 1439     Subjective Patient presents to therapy s/p lumbar fusion on 03/18/21. He says this has improved some of his symptoms but "gave me something else". He reports some residual pain on right and left posterior hip area, and some pain near incision at front LT groin area. He also reports some onset of short-term  memory loss.    Pertinent History 5 neck surgeries, 2 back surgeries, most recent lumbar fusion 4.26.22    Limitations Lifting;Standing;Walking;House hold activities    Patient Stated Goals Be less in pain    Currently in Pain? Yes    Pain Score 7     Pain Location Back    Pain Orientation Right;Left;Posterior    Pain Descriptors / Indicators Shooting;Sharp    Pain Type Acute pain    Pain Onset More than a month ago    Pain Frequency Intermittent    Aggravating Factors  bending, getting off balance, walking    Pain Relieving Factors sleeping, rest,    Effect of Pain on Daily Activities Limits                [facility] PT Assessment - 05/14/21 0001       Assessment   Medical Diagnosis Lumbar fusion    Referring Provider (PT) Rema Jasmine NP    Onset Date/Surgical Date 03/18/21    Prior Therapy Yes      Prior Function   Level of Independence Independent with basic ADLs      Cognition   Overall Cognitive Status Within Functional Limits for tasks assessed      Observation/Other Assessments   Focus on Therapeutic Outcomes (FOTO)  36% function  ROM / Strength   AROM / PROM / Strength AROM;Strength      AROM   AROM Assessment Site Lumbar    Lumbar Flexion 20% limited    Lumbar Extension 90% limited    Lumbar - Right Side Bend 50% limited    Lumbar - Left Side Bend 50% limited      Strength   Strength Assessment Site Hip;Knee;Ankle    Right/Left Hip Right;Left    Right Hip Flexion 4+/5    Right Hip Extension 3-/5    Right Hip ABduction 4+/5    Left Hip Flexion 4/5    Left Hip Extension 3-/5    Left Hip ABduction 4-/5    Right/Left Knee Right;Left    Right Knee Extension 5/5    Left Knee Extension 4+/5    Right/Left Ankle Left;Right    Right Ankle Dorsiflexion 4+/5    Left Ankle Dorsiflexion 4+/5      Palpation   Palpation comment Mod TTP about bilateral lumbar paraspinals, and bilat SI sulcus                        Objective measurements  completed on examination: See above findings.       [place] Adult PT Treatment/Exercise - 05/14/21 0001       Exercises   Exercises Lumbar      Lumbar Exercises: Supine   Ab Set 5 reps    Glut Set 5 reps    Bent Knee Raise 10 reps                    PT Education - 05/14/21 1442     Education Details on evaluation findings, POC and HEP    Person(s) Educated Patient    Methods Explanation;Handout    Comprehension Verbalized understanding              PT Short Term Goals - 05/14/21 1508       PT SHORT TERM GOAL #1   Title Patient will be independent with initial HEP and self-management strategies to improve functional outcomes    Time 2    Period Weeks    Status New    Target Date 05/29/21               PT Long Term Goals - 05/14/21 1509       PT LONG TERM GOAL #1   Title Patient will improve FOTO score to predicted value to indicate improvement in functional outcomes    Time 4    Period Weeks    Status New    Target Date 06/11/21      PT LONG TERM GOAL #2   Title Patient will be independent with advanced HEP and self-management strategies to improve functional outcomes    Time 4    Period Weeks    Status New    Target Date 06/11/21      PT LONG TERM GOAL #3   Title Patient will have equal to or > 4+/5 MMT throughout BLE to improve ability to perform functional mobility, stair ambulation and ADLs.    Time 4    Period Weeks    Status New    Target Date 06/11/21      PT LONG TERM GOAL #4   Title Patient will improve lumbar AROM by 25% in all restricted planes for improved ability to perform functional mobility tasks and ADLs.    Time 4  Period Weeks    Status New    Target Date 06/11/21                    Plan - 05/14/21 1504     Clinical Impression Statement Patient is a 60 y.o. male who presents to physical therapy with complaint of LBP s/p lumbar fusion 03/18/21. Patient demonstrates decreased strength, ROM  restriction, decreased flexibility and increased tenderness to palpation which are likely contributing to symptoms of pain and are negatively impacting patient ability to perform ADLs and functional mobility tasks. Patient will benefit from skilled physical therapy services to address these deficits to reduce pain and improve level of function with ADLs and functional mobility tasks.    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, jewellery Low    Rehab Potential Fair    PT Frequency 2x / week    PT Duration 4 weeks    PT Treatment/Interventions ADLs/Self Care Home Management;Aquatic Therapy;Cryotherapy;Biofeedback;Gait training;DME Instruction;Patient/family education;Neuromuscular re-education;Compression bandaging;Scar mobilization;Visual/perceptual remediation/compensation;Passive range of motion;Spinal Manipulations;Dry needling;Joint Manipulations;Orthotic Fit/Training;Stair training;Energy conservation;Functional mobility training;Splinting;Electrical Stimulation;Iontophoresis 4mg /ml Dexamethasone;Moist Heat;Traction;Therapeutic exercise;Balance training;Manual lymph drainage;Manual techniques;Vasopneumatic Device;Vestibular;Taping;Therapeutic activities;Fluidtherapy;Parrafin;Ultrasound;Contrast Bath    PT Next Visit Plan Review goals and HEP. Progress core strength and lumbar mobility as tolerated. Add bridge, LTR, SLR.    PT Home Exercise Plan Eval: ab set, glute set,    Consulted and Agree with Plan of Care Patient             Patient will benefit from skilled therapeutic intervention in order to improve the following deficits and impairments:  Abnormal gait, Hypomobility,  Decreased scar mobility, Decreased activity tolerance, Decreased strength, Pain, Increased fascial restricitons, Decreased mobility, Difficulty walking, Impaired flexibility, Decreased range of motion, Improper body mechanics  Visit Diagnosis: Low back pain, unspecified back pain laterality, unspecified chronicity, unspecified whether sciatica present  Other abnormalities of gait and mobility     Problem List Patient Active Problem List   Diagnosis Date Noted   SBO (small bowel obstruction) ([place]) 09/24/2017   3:14 PM, 05/14/21 Josue Hector PT DPT  Physical Therapist with [facility]  [phone] [place], [place], [address] Phone: [phone]   Fax:  [phone]  Name: [name] MRN: [id number] Date of Birth: [birth year]

## 2021-05-14 NOTE — Patient Instructions (Signed)
Access Code: [email or web address] URL: [email or web address] Date: 05/14/2021 Prepared by: Josue Hector  Exercises Supine Transversus Abdominis Bracing - Hands on Stomach - 2-3 x daily - 7 x weekly - 2 sets - 10 reps - 5 seconds hold Supine Gluteal Sets - 2-3 x daily - 7 x weekly - 2 sets - 10 reps - 5 seconds hold Supine March - 2-3 x daily - 7 x weekly - 2 sets - 10 reps

## 2021-05-15 NOTE — Procedures (Signed)
   [place] A. Merlene Laughter, MD     [email or web address]             HOME SLEEP STUDY  LOCATION: [place]  Patient Name: [name], [name] Date: 05/09/2021 Gender: Male D.O.B: 1961-02-20 Age (years): 62 Referring Provider: Phillips Odor MD, ABSM Height (inches): 70 Interpreting Physician: Phillips Odor MD, ABSM Weight (lbs): 205 RPSGT: Rosebud Poles BMI: 30 MRN: [id number] Neck Size: CLINICAL INFORMATION Sleep Study Type: HST     Indication for sleep study: N/A     Epworth Sleepiness Score: N/A  SLEEP STUDY TECHNIQUE A multi-channel overnight portable sleep study was performed. The channels recorded were: nasal airflow, thoracic respiratory movement, and oxygen saturation with a pulse oximetry. Snoring was also monitored.  MEDICATIONS Patient self administered medications include: N/A.  Current Outpatient Medications:    Ascorbic Acid (VITAMIN C) 1000 MG tablet, Take 1,000 mg by mouth daily.  , Disp: , Rfl:    Cetirizine HCl (ZYRTEC ALLERGY) 10 MG CAPS, Take by mouth., Disp: , Rfl:    SLEEP ARCHITECTURE Patient was studied for 411 minutes. The sleep efficiency was 68.5 % and the patient was supine for 0.5%. The arousal index was 0.0 per hour.  RESPIRATORY PARAMETERS The overall AHI was 5.4 per hour, with a central apnea index of 0 per hour.  The oxygen nadir was 86% during sleep.     CARDIAC DATA Mean heart rate during sleep was 72.0 bpm.  IMPRESSIONS   Mild obstructive sleep apnea syndrome is documented with this recording. The severity does not require positive pressure treatment.   Delano Metz, MD Diplomate, American Board of Sleep Medicine.  ELECTRONICALLY SIGNED ON:  05/15/2021, 6:55 PM [place] [address] PH: [phone]   FX: [phone] [place]

## 2021-05-27 DIAGNOSIS — R339 Retention of urine, unspecified: Secondary | ICD-10-CM | POA: Diagnosis not present

## 2021-05-28 ENCOUNTER — Other Ambulatory Visit: Payer: Self-pay

## 2021-05-28 ENCOUNTER — Ambulatory Visit (HOSPITAL_COMMUNITY): Payer: BC Managed Care – PPO | Attending: Adult Health Nurse Practitioner | Admitting: Physical Therapy

## 2021-05-28 ENCOUNTER — Encounter (HOSPITAL_COMMUNITY): Payer: Self-pay | Admitting: Physical Therapy

## 2021-05-28 DIAGNOSIS — Z9889 Other specified postprocedural states: Secondary | ICD-10-CM | POA: Insufficient documentation

## 2021-05-28 DIAGNOSIS — M5441 Lumbago with sciatica, right side: Secondary | ICD-10-CM | POA: Diagnosis not present

## 2021-05-28 DIAGNOSIS — R2689 Other abnormalities of gait and mobility: Secondary | ICD-10-CM | POA: Diagnosis not present

## 2021-05-28 DIAGNOSIS — G8929 Other chronic pain: Secondary | ICD-10-CM | POA: Insufficient documentation

## 2021-05-28 DIAGNOSIS — M5442 Lumbago with sciatica, left side: Secondary | ICD-10-CM | POA: Diagnosis not present

## 2021-05-28 DIAGNOSIS — M545 Low back pain, unspecified: Secondary | ICD-10-CM

## 2021-05-28 DIAGNOSIS — R262 Difficulty in walking, not elsewhere classified: Secondary | ICD-10-CM | POA: Insufficient documentation

## 2021-05-28 NOTE — Therapy (Signed)
[place], [place], [address] Phone: [phone]   Fax:  [phone]  Physical Therapy Treatment  Patient Details  Name: [name] MRN: [id number] Date of Birth: Dec 07, 1960 Referring Provider (PT): Rema Jasmine NP   Encounter Date: 05/28/2021   PT End of Session - 05/28/21 1125     Visit Number 2    Number of Visits 8    Date for PT Re-Evaluation 06/11/21    Authorization Type [facility] [employer], BCBS COMM PPO secondary    PT Start Time 1118    PT Stop Time 1150    PT Time Calculation (min) 32 min    Activity Tolerance Patient tolerated treatment well    Behavior During Therapy [facility] for tasks assessed/performed             History reviewed. No pertinent past medical history.  Past Surgical History:  Procedure Laterality Date   ANTERIOR CERVICAL DISCECTOMY  1999   CERVICAL FUSION  2010, 2012   [place]   right   intestinal obstruction  2010   OTHER SURGICAL HISTORY  1966   sternum surgery   ribs removed as child  1967   thornwaldt cyst removed     [place]  2012   ULNA OSTEOTOMY Right 10/09/2014   Procedure: ULNAR SHORTENING;  Surgeon: Daryll Brod, MD;  Location: [place];  Service: Orthopedics;  Laterality: Right;   WRIST ARTHROSCOPY Right 10/09/2014   Procedure: RIGHT WRIST ARTHROSCOPY WITH ULNA SHORTENING OSTEOTOMY;  Surgeon: Daryll Brod, MD;  Location: [place];  Service: Orthopedics;  Laterality: Right;    There were no vitals filed for this visit.   Subjective Assessment - 05/28/21 1124     Subjective Patient reports no new issues. No problem with HEP. Back pain is about the same. "I start off good, but it gains on me".    Pertinent History 5 neck surgeries, 2 back surgeries, most recent lumbar fusion 4.26.22    Limitations Lifting;Standing;Walking;House hold activities    Patient Stated  Goals Be less in pain    Currently in Pain? Yes    Pain Score 5     Pain Location Back    Pain Orientation Posterior;Lower    Pain Descriptors / Indicators Shooting;Sharp    Pain Type Acute pain    Pain Onset More than a month ago    Pain Frequency Intermittent                               [facility] Adult PT Treatment/Exercise - 05/28/21 0001       Lumbar Exercises: Stretches   Lower Trunk Rotation 5 reps;10 seconds      Lumbar Exercises: Supine   Ab Set 10 reps    Glut Set 10 reps    Bent Knee Raise 10 reps    Bridge 10 reps    Straight Leg Raise 10 reps                      PT Short Term Goals - 05/14/21 1508       PT SHORT TERM GOAL #1   Title Patient will be independent with initial HEP and self-management strategies to improve functional outcomes    Time 2    Period Weeks    Status New  Target Date 05/29/21               PT Long Term Goals - 05/14/21 1509       PT LONG TERM GOAL #1   Title Patient will improve FOTO score to predicted value to indicate improvement in functional outcomes    Time 4    Period Weeks    Status New    Target Date 06/11/21      PT LONG TERM GOAL #2   Title Patient will be independent with advanced HEP and self-management strategies to improve functional outcomes    Time 4    Period Weeks    Status New    Target Date 06/11/21      PT LONG TERM GOAL #3   Title Patient will have equal to or > 4+/5 MMT throughout BLE to improve ability to perform functional mobility, stair ambulation and ADLs.    Time 4    Period Weeks    Status New    Target Date 06/11/21      PT LONG TERM GOAL #4   Title Patient will improve lumbar AROM by 25% in all restricted planes for improved ability to perform functional mobility tasks and ADLs.    Time 4    Period Weeks    Status New    Target Date 06/11/21                   Plan - 05/28/21 1201     Clinical Impression Statement Patient tolerated  session well today. Reviewed goals and HEP. Progressed core strengthening on mat. Patient educated on purpose and function of all activity. Patient cued on ab brace during SLR. Issued updated HEP handout. Patient request early departure for following Dentist appointment.    Personal Factors and Comorbidities Time since onset of injury/illness/exacerbation;Past/Current Experience;Comorbidity 2    Examination-Activity Limitations Locomotion Level;Transfers;Stand;Squat;Bend    Examination-Participation Restrictions Community Activity;Occupation;Yard Work    Stability/Clinical Decision Making Stable/Uncomplicated    Rehab Potential Fair    PT Frequency 2x / week    PT Duration 4 weeks    PT Treatment/Interventions ADLs/Self Care Home Management;Aquatic Therapy;Cryotherapy;Biofeedback;Gait training;DME Instruction;Patient/family education;Neuromuscular re-education;Compression bandaging;Scar mobilization;Visual/perceptual remediation/compensation;Passive range of motion;Spinal Manipulations;Dry needling;Joint Manipulations;Orthotic Fit/Training;Stair training;Energy conservation;Functional mobility training;Splinting;Electrical Stimulation;Iontophoresis 4mg /ml Dexamethasone;Moist Heat;Traction;Therapeutic exercise;Balance training;Manual lymph drainage;Manual techniques;Vasopneumatic Device;Vestibular;Taping;Therapeutic activities;Fluidtherapy;Parrafin;Ultrasound;Contrast Bath    PT Next Visit Plan Progress core strength and lumbar mobility as tolerated. Progress to standing, try scar tissue message possibly about posterior pain location    PT Home Exercise Plan Eval: ab set, glute set, 7/6 SLR, bridge. LTR    Consulted and Agree with Plan of Care Patient             Patient will benefit from skilled therapeutic intervention in order to improve the following deficits and impairments:  Abnormal gait, Hypomobility, Decreased scar mobility, Decreased activity tolerance, Decreased strength, Pain, Increased  fascial restricitons, Decreased mobility, Difficulty walking, Impaired flexibility, Decreased range of motion, Improper body mechanics  Visit Diagnosis: Low back pain, unspecified back pain laterality, unspecified chronicity, unspecified whether sciatica present  Other abnormalities of gait and mobility     Problem List Patient Active Problem List   Diagnosis Date Noted   SBO (small bowel obstruction) ([place]) 09/24/2017   12:03 PM, 05/28/21 Josue Hector PT DPT  Physical Therapist with [facility]  [phone] [place], [place], [address] Phone: [phone]  Fax:  [phone]  Name: [name] MRN: [id number] Date of Birth: Dec 20, 1960

## 2021-05-28 NOTE — Patient Instructions (Signed)
Access Code: [email or web address] URL: [email or web address] Date: 05/28/2021 Prepared by: Josue Hector  Exercises Supine Bridge - 2 x daily - 7 x weekly - 2 sets - 10 reps Supine Lower Trunk Rotation - 2 x daily - 7 x weekly - 1 sets - 10 reps - 10 second hold Active Straight Leg Raise with Quad Set - 2 x daily - 7 x weekly - 2 sets - 10 reps

## 2021-05-30 ENCOUNTER — Other Ambulatory Visit: Payer: Self-pay

## 2021-05-30 ENCOUNTER — Ambulatory Visit (HOSPITAL_COMMUNITY): Payer: BC Managed Care – PPO

## 2021-05-30 ENCOUNTER — Encounter (HOSPITAL_COMMUNITY): Payer: Self-pay

## 2021-05-30 DIAGNOSIS — Z9889 Other specified postprocedural states: Secondary | ICD-10-CM

## 2021-05-30 DIAGNOSIS — R262 Difficulty in walking, not elsewhere classified: Secondary | ICD-10-CM

## 2021-05-30 DIAGNOSIS — R2689 Other abnormalities of gait and mobility: Secondary | ICD-10-CM | POA: Diagnosis not present

## 2021-05-30 DIAGNOSIS — G8929 Other chronic pain: Secondary | ICD-10-CM | POA: Diagnosis not present

## 2021-05-30 DIAGNOSIS — M545 Low back pain, unspecified: Secondary | ICD-10-CM

## 2021-05-30 DIAGNOSIS — M5442 Lumbago with sciatica, left side: Secondary | ICD-10-CM | POA: Diagnosis not present

## 2021-05-30 DIAGNOSIS — M5441 Lumbago with sciatica, right side: Secondary | ICD-10-CM | POA: Diagnosis not present

## 2021-05-30 NOTE — Therapy (Signed)
[place], [place], [address] Phone: [phone]   Fax:  [phone]  Physical Therapy Treatment  Patient Details  Name: [name] MRN: [id number] Date of Birth: 16-Sep-1961 Referring Provider (PT): Rema Jasmine NP   Encounter Date: 05/30/2021   PT End of Session - 05/30/21 1412     Visit Number 3    Number of Visits 8    Date for PT Re-Evaluation 06/11/21    Authorization Type [facility] [employer], BCBS COMM PPO secondary    PT Start Time [phone]    PT Stop Time 1430    PT Time Calculation (min) 43 min    Activity Tolerance Patient tolerated treatment well    Behavior During Therapy [facility] for tasks assessed/performed             History reviewed. No pertinent past medical history.  Past Surgical History:  Procedure Laterality Date   ANTERIOR CERVICAL DISCECTOMY  1999   CERVICAL FUSION  2010, 2012   [place]   right   intestinal obstruction  2010   OTHER SURGICAL HISTORY  1966   sternum surgery   ribs removed as child  1967   thornwaldt cyst removed     [place]  2012   ULNA OSTEOTOMY Right 10/09/2014   Procedure: ULNAR SHORTENING;  Surgeon: Daryll Brod, MD;  Location: [place];  Service: Orthopedics;  Laterality: Right;   WRIST ARTHROSCOPY Right 10/09/2014   Procedure: RIGHT WRIST ARTHROSCOPY WITH ULNA SHORTENING OSTEOTOMY;  Surgeon: Daryll Brod, MD;  Location: [place];  Service: Orthopedics;  Laterality: Right;    There were no vitals filed for this visit.   Subjective Assessment - 05/30/21 1409     Subjective Reports left abdominal wall dysfunction and lack of contraction with isometric activities and notes a bulge from the abdominal wall which he relates to surgical incision    Pertinent History 5 neck surgeries, 2 back surgeries, most recent lumbar fusion 4.26.22    Limitations  Lifting;Standing;Walking;House hold activities    Patient Stated Goals Be less in pain    Pain Onset More than a month ago                [facility] PT Assessment - 05/30/21 0001       Assessment   Medical Diagnosis Lumbar fusion    Referring Provider (PT) Rema Jasmine NP    Onset Date/Surgical Date 03/18/21    Prior Therapy Yes                           [facility] Adult PT Treatment/Exercise - 05/30/21 0001       Lumbar Exercises: Aerobic   Nustep level 3 x 7 min      Lumbar Exercises: Supine   Ab Set 10 reps;2 seconds    Isometric Hip Flexion 10 reps;2 seconds                    PT Education - 05/30/21 1411     Education Details education on use of "waist trainer" or supportive corset to act as abdominal brace to stabilize wall and improve trunk stability    Person(s) Educated Patient    Methods Explanation    Comprehension Verbalized understanding              PT Short Term Goals -  05/14/21 1508       PT SHORT TERM GOAL #1   Title Patient will be independent with initial HEP and self-management strategies to improve functional outcomes    Time 2    Period Weeks    Status New    Target Date 05/29/21               PT Long Term Goals - 05/14/21 1509       PT LONG TERM GOAL #1   Title Patient will improve FOTO score to predicted value to indicate improvement in functional outcomes    Time 4    Period Weeks    Status New    Target Date 06/11/21      PT LONG TERM GOAL #2   Title Patient will be independent with advanced HEP and self-management strategies to improve functional outcomes    Time 4    Period Weeks    Status New    Target Date 06/11/21      PT LONG TERM GOAL #3   Title Patient will have equal to or > 4+/5 MMT throughout BLE to improve ability to perform functional mobility, stair ambulation and ADLs.    Time 4    Period Weeks    Status New    Target Date 06/11/21      PT LONG TERM GOAL #4   Title  Patient will improve lumbar AROM by 25% in all restricted planes for improved ability to perform functional mobility tasks and ADLs.    Time 4    Period Weeks    Status New    Target Date 06/11/21                   Plan - 05/30/21 1433     Clinical Impression Statement Tolerating tx session well. Requiring cues for attention to task.  Continued sessions indicated to improve lumbar and trunk strength to improve abdominal stability to progress for ambulation/activity tolerance.  Explanation of benefits of consistent cardiovascular exercise via recumebent equipment    Personal Factors and Comorbidities Time since onset of injury/illness/exacerbation;Past/Current Experience;Comorbidity 2    Examination-Activity Limitations Locomotion Level;Transfers;Stand;Squat;Bend    Examination-Participation Restrictions Community Activity;Occupation;Yard Work    Stability/Clinical Decision Making Stable/Uncomplicated    Rehab Potential Fair    PT Frequency 2x / week    PT Duration 4 weeks    PT Treatment/Interventions ADLs/Self Care Home Management;Aquatic Therapy;Cryotherapy;Biofeedback;Gait training;DME Instruction;Patient/family education;Neuromuscular re-education;Compression bandaging;Scar mobilization;Visual/perceptual remediation/compensation;Passive range of motion;Spinal Manipulations;Dry needling;Joint Manipulations;Orthotic Fit/Training;Stair training;Energy conservation;Functional mobility training;Splinting;Electrical Stimulation;Iontophoresis 4mg /ml Dexamethasone;Moist Heat;Traction;Therapeutic exercise;Balance training;Manual lymph drainage;Manual techniques;Vasopneumatic Device;Vestibular;Taping;Therapeutic activities;Fluidtherapy;Parrafin;Ultrasound;Contrast Bath    PT Next Visit Plan Progress core strength and lumbar mobility as tolerated. Progress to standing, try scar tissue message possibly about posterior pain location    PT Home Exercise Plan Eval: ab set, glute set, 7/6 SLR,  bridge. LTR    Consulted and Agree with Plan of Care Patient             Patient will benefit from skilled therapeutic intervention in order to improve the following deficits and impairments:  Abnormal gait, Hypomobility, Decreased scar mobility, Decreased activity tolerance, Decreased strength, Pain, Increased fascial restricitons, Decreased mobility, Difficulty walking, Impaired flexibility, Decreased range of motion, Improper body mechanics  Visit Diagnosis: Low back pain, unspecified back pain laterality, unspecified chronicity, unspecified whether sciatica present  Other abnormalities of gait and mobility  Chronic midline low back pain with bilateral sciatica  Difficulty in walking, not elsewhere classified  Status  post lumbar surgery  Chronic bilateral low back pain without sciatica     Problem List Patient Active Problem List   Diagnosis Date Noted   SBO (small bowel obstruction) ([place]) 09/24/2017   2:35 PM, 05/30/21 M. Sherlyn Lees, PT, DPT Physical Therapist- [place] Office Number: [phone]   [place] [address] [place], [place], [address] Phone: [phone]   Fax:  [phone]  Name: [name] MRN: [id number] Date of Birth: 16-Sep-1961

## 2021-06-02 ENCOUNTER — Ambulatory Visit (HOSPITAL_COMMUNITY): Payer: BC Managed Care – PPO | Admitting: Physical Therapy

## 2021-06-02 ENCOUNTER — Other Ambulatory Visit: Payer: Self-pay

## 2021-06-02 DIAGNOSIS — R2689 Other abnormalities of gait and mobility: Secondary | ICD-10-CM

## 2021-06-02 DIAGNOSIS — R262 Difficulty in walking, not elsewhere classified: Secondary | ICD-10-CM

## 2021-06-02 DIAGNOSIS — M5442 Lumbago with sciatica, left side: Secondary | ICD-10-CM | POA: Diagnosis not present

## 2021-06-02 DIAGNOSIS — M5441 Lumbago with sciatica, right side: Secondary | ICD-10-CM | POA: Diagnosis not present

## 2021-06-02 DIAGNOSIS — M545 Low back pain, unspecified: Secondary | ICD-10-CM

## 2021-06-02 DIAGNOSIS — G8929 Other chronic pain: Secondary | ICD-10-CM

## 2021-06-02 DIAGNOSIS — Z9889 Other specified postprocedural states: Secondary | ICD-10-CM | POA: Diagnosis not present

## 2021-06-02 NOTE — Therapy (Signed)
[place], [place], [address] Phone: [phone]   Fax:  [phone]  Physical Therapy Treatment  Patient Details  Name: CHETAN MEHRING MRN: [id number] Date of Birth: Mar 17, 1961 Referring Provider (PT): Rema Jasmine NP   Encounter Date: 06/02/2021   PT End of Session - 06/02/21 1210     Visit Number 4    Number of Visits 8    Date for PT Re-Evaluation 06/11/21    Authorization Type [facility], BCBS COMM PPO secondary    PT Start Time [phone]    PT Stop Time 1218    PT Time Calculation (min) 45 min    Activity Tolerance Patient tolerated treatment well    Behavior During Therapy [facility] for tasks assessed/performed             No past medical history on file.  Past Surgical History:  Procedure Laterality Date   ANTERIOR CERVICAL DISCECTOMY  1999   CERVICAL FUSION  2010, 2012   [place]   right   intestinal obstruction  2010   OTHER SURGICAL HISTORY  1966   sternum surgery   ribs removed as child  1967   thornwaldt cyst removed     [place]  2012   ULNA OSTEOTOMY Right 10/09/2014   Procedure: ULNAR SHORTENING;  Surgeon: Daryll Brod, MD;  Location: [place];  Service: Orthopedics;  Laterality: Right;   WRIST ARTHROSCOPY Right 10/09/2014   Procedure: RIGHT WRIST ARTHROSCOPY WITH ULNA SHORTENING OSTEOTOMY;  Surgeon: Daryll Brod, MD;  Location: [place];  Service: Orthopedics;  Laterality: Right;    There were no vitals filed for this visit.   Subjective Assessment - 06/02/21 1155     Subjective pt states is pain is up in his lower back to 9/10 but denies needing to go to the ED.  States his neck is also hurting him today.    Currently in Pain? Yes    Pain Score 9     Pain Location Back    Pain Orientation Posterior;Lower    Pain Descriptors / Indicators Aching;Sore;Shooting;Sharp    Pain Type Chronic  pain                               [facility] Adult PT Treatment/Exercise - 06/02/21 0001       Lumbar Exercises: Stretches   Lower Trunk Rotation 5 reps;10 seconds      Lumbar Exercises: Aerobic   Nustep level 3 x 7 min   at EOS (not billed)     Lumbar Exercises: Supine   Ab Set 10 reps;2 seconds    Bent Knee Raise 10 reps    Bridge 10 reps    Straight Leg Raise 10 reps    Isometric Hip Flexion 10 reps;5 seconds                      PT Short Term Goals - 05/14/21 1508       PT SHORT TERM GOAL #1   Title Patient will be independent with initial HEP and self-management strategies to improve functional outcomes    Time 2    Period Weeks    Status New    Target Date 05/29/21               PT Long Term Goals -  05/14/21 1509       PT LONG TERM GOAL #1   Title Patient will improve FOTO score to predicted value to indicate improvement in functional outcomes    Time 4    Period Weeks    Status New    Target Date 06/11/21      PT LONG TERM GOAL #2   Title Patient will be independent with advanced HEP and self-management strategies to improve functional outcomes    Time 4    Period Weeks    Status New    Target Date 06/11/21      PT LONG TERM GOAL #3   Title Patient will have equal to or > 4+/5 MMT throughout BLE to improve ability to perform functional mobility, stair ambulation and ADLs.    Time 4    Period Weeks    Status New    Target Date 06/11/21      PT LONG TERM GOAL #4   Title Patient will improve lumbar AROM by 25% in all restricted planes for improved ability to perform functional mobility tasks and ADLs.    Time 4    Period Weeks    Status New    Target Date 06/11/21                   Plan - 06/02/21 1214     Clinical Impression Statement Pt with increased pain today throughout lumbar spine into cervical area.  Reports he's been working on his HEP.  Began with addition of standing hip excursions with  minimal movement without incorporating whole body movements.  Required AAROM from therapist to isolate lumbar movements.  Continued with abdominal contractions and lumbar stabilization exercises.   Pt with difficultly completing SLR (Rt more challenging than Lt) due to cramping in quad and weakness.  Finished with nustep.  PT reported no change in pain at EOS.    Personal Factors and Comorbidities Time since onset of injury/illness/exacerbation;Past/Current Experience;Comorbidity 2    Examination-Activity Limitations Locomotion Level;Transfers;Stand;Squat;Bend    Examination-Participation Restrictions Community Activity;Occupation;Yard Work    Stability/Clinical Decision Making Stable/Uncomplicated    Rehab Potential Fair    PT Frequency 2x / week    PT Duration 4 weeks    PT Treatment/Interventions ADLs/Self Care Home Management;Aquatic Therapy;Cryotherapy;Biofeedback;Gait training;DME Instruction;Patient/family education;Neuromuscular re-education;Compression bandaging;Scar mobilization;Visual/perceptual remediation/compensation;Passive range of motion;Spinal Manipulations;Dry needling;Joint Manipulations;Orthotic Fit/Training;Stair training;Energy conservation;Functional mobility training;Splinting;Electrical Stimulation;Iontophoresis 4mg /ml Dexamethasone;Moist Heat;Traction;Therapeutic exercise;Balance training;Manual lymph drainage;Manual techniques;Vasopneumatic Device;Vestibular;Taping;Therapeutic activities;Fluidtherapy;Parrafin;Ultrasound;Contrast Bath    PT Next Visit Plan Progress core strength and lumbar mobility as tolerated. Progress to standing exercises.    PT Home Exercise Plan Eval: ab set, glute set, 7/6 SLR, bridge. LTR    Consulted and Agree with Plan of Care Patient             Patient will benefit from skilled therapeutic intervention in order to improve the following deficits and impairments:  Abnormal gait, Hypomobility, Decreased scar mobility, Decreased activity  tolerance, Decreased strength, Pain, Increased fascial restricitons, Decreased mobility, Difficulty walking, Impaired flexibility, Decreased range of motion, Improper body mechanics  Visit Diagnosis: Low back pain, unspecified back pain laterality, unspecified chronicity, unspecified whether sciatica present  Other abnormalities of gait and mobility  Chronic midline low back pain with bilateral sciatica  Difficulty in walking, not elsewhere classified     Problem List Patient Active Problem List   Diagnosis Date Noted   SBO (small bowel obstruction) ([place]) 09/24/2017   Teena Irani, PTA/CLT [place], [place] Rennie B 06/02/2021,  12:16 PM  [place], [place], [address] Phone: [phone]   Fax:  [phone]  Name: NAZARETH KIRK MRN: [id number] Date of Birth: 08-14-1961

## 2021-06-04 ENCOUNTER — Encounter (HOSPITAL_COMMUNITY): Payer: Self-pay | Admitting: Physical Therapy

## 2021-06-04 ENCOUNTER — Other Ambulatory Visit: Payer: Self-pay

## 2021-06-04 ENCOUNTER — Ambulatory Visit (HOSPITAL_COMMUNITY): Payer: BC Managed Care – PPO | Admitting: Physical Therapy

## 2021-06-04 DIAGNOSIS — Z9889 Other specified postprocedural states: Secondary | ICD-10-CM | POA: Diagnosis not present

## 2021-06-04 DIAGNOSIS — R262 Difficulty in walking, not elsewhere classified: Secondary | ICD-10-CM | POA: Diagnosis not present

## 2021-06-04 DIAGNOSIS — G8929 Other chronic pain: Secondary | ICD-10-CM

## 2021-06-04 DIAGNOSIS — R2689 Other abnormalities of gait and mobility: Secondary | ICD-10-CM | POA: Diagnosis not present

## 2021-06-04 DIAGNOSIS — M545 Low back pain, unspecified: Secondary | ICD-10-CM | POA: Diagnosis not present

## 2021-06-04 DIAGNOSIS — M5442 Lumbago with sciatica, left side: Secondary | ICD-10-CM

## 2021-06-04 DIAGNOSIS — M5441 Lumbago with sciatica, right side: Secondary | ICD-10-CM | POA: Diagnosis not present

## 2021-06-04 NOTE — Patient Instructions (Signed)
Access Code: [email or web address] URL: [email or web address] Date: 06/04/2021 Prepared by: Josue Hector  Exercises Standing Hip Abduction with Counter Support - 2-3 x daily - 7 x weekly - 2 sets - 10 reps Standing Hip Extension with Counter Support - 2-3 x daily - 7 x weekly - 2 sets - 10 reps

## 2021-06-04 NOTE — Therapy (Signed)
[place], [place], [address] Phone: [phone]   Fax:  [phone]  Physical Therapy Treatment  Patient Details  Name: [name] MRN: [id number] Date of Birth: Jun 20, 1961 Referring Provider (PT): Rema Jasmine NP   Encounter Date: 06/04/2021   PT End of Session - 06/04/21 1005     Visit Number 5    Number of Visits 8    Date for PT Re-Evaluation 06/11/21    Authorization Type [facility], BCBS COMM PPO secondary    PT Start Time [phone]    PT Stop Time 1038    PT Time Calculation (min) 43 min    Activity Tolerance Patient tolerated treatment well    Behavior During Therapy [facility] for tasks assessed/performed             History reviewed. No pertinent past medical history.  Past Surgical History:  Procedure Laterality Date   ANTERIOR CERVICAL DISCECTOMY  1999   CERVICAL FUSION  2010, 2012   [place]   right   intestinal obstruction  2010   OTHER SURGICAL HISTORY  1966   sternum surgery   ribs removed as child  1967   thornwaldt cyst removed     [place]  2012   ULNA OSTEOTOMY Right 10/09/2014   Procedure: ULNAR SHORTENING;  Surgeon: Daryll Brod, MD;  Location: [place];  Service: Orthopedics;  Laterality: Right;   WRIST ARTHROSCOPY Right 10/09/2014   Procedure: RIGHT WRIST ARTHROSCOPY WITH ULNA SHORTENING OSTEOTOMY;  Surgeon: Daryll Brod, MD;  Location: [place];  Service: Orthopedics;  Laterality: Right;    There were no vitals filed for this visit.   Subjective Assessment - 06/04/21 1004     Subjective Patient reports ongoing issues with his hernia, burning in his back and sharp sticking pains in his foot. Does not notice any improvement with therapy exercise but "can do them". States he understands the reason for doing them.    Currently in Pain? Yes    Pain Score 8     Pain Location Back     Pain Orientation Lower;Posterior    Pain Descriptors / Indicators Aching;Burning;Sore    Pain Type Chronic pain    Pain Onset More than a month ago    Pain Frequency Constant                [facility] PT Assessment - 06/04/21 0001       Observation/Other Assessments   Focus on Therapeutic Outcomes (FOTO)  24% function   36% function                          [facility] Adult PT Treatment/Exercise - 06/04/21 0001       Lumbar Exercises: Stretches   Double Knee to Chest Stretch 5 reps;10 seconds    Lower Trunk Rotation 5 reps;10 seconds      Lumbar Exercises: Standing   Other Standing Lumbar Exercises standing hip abduction and extension x 10 each      Lumbar Exercises: Supine   Ab Set 10 reps;5 seconds    Bent Knee Raise 10 reps    Bridge 10 reps;5 seconds    Isometric Hip Flexion 10 reps;5 seconds                      PT Short Term Goals -  05/14/21 1508       PT SHORT TERM GOAL #1   Title Patient will be independent with initial HEP and self-management strategies to improve functional outcomes    Time 2    Period Weeks    Status New    Target Date 05/29/21               PT Long Term Goals - 05/14/21 1509       PT LONG TERM GOAL #1   Title Patient will improve FOTO score to predicted value to indicate improvement in functional outcomes    Time 4    Period Weeks    Status New    Target Date 06/11/21      PT LONG TERM GOAL #2   Title Patient will be independent with advanced HEP and self-management strategies to improve functional outcomes    Time 4    Period Weeks    Status New    Target Date 06/11/21      PT LONG TERM GOAL #3   Title Patient will have equal to or > 4+/5 MMT throughout BLE to improve ability to perform functional mobility, stair ambulation and ADLs.    Time 4    Period Weeks    Status New    Target Date 06/11/21      PT LONG TERM GOAL #4   Title Patient will improve lumbar AROM by 25% in all restricted  planes for improved ability to perform functional mobility tasks and ADLs.    Time 4    Period Weeks    Status New    Target Date 06/11/21                   Plan - 06/04/21 1032     Clinical Impression Statement Patient voicing decreased perceived function and shows decrease in FOTO score. He lists several issues he is having including his neck and issues with radicular symptoms in his arms. He describes he feels limited in mobility and refers to his recent xrays showing recent surgery as contributing factor. Progressed to standing LE strengthening. He tolerates exercises well overall. Activity graded per patient tolerance. Will continue to progress as tolerated.    Personal Factors and Comorbidities Time since onset of injury/illness/exacerbation;Past/Current Experience;Comorbidity 2    Examination-Activity Limitations Locomotion Level;Transfers;Stand;Squat;Bend    Examination-Participation Restrictions Community Activity;Occupation;Yard Work    Stability/Clinical Decision Making Stable/Uncomplicated    Rehab Potential Fair    PT Frequency 2x / week    PT Duration 4 weeks    PT Treatment/Interventions ADLs/Self Care Home Management;Aquatic Therapy;Cryotherapy;Biofeedback;Gait training;DME Instruction;Patient/family education;Neuromuscular re-education;Compression bandaging;Scar mobilization;Visual/perceptual remediation/compensation;Passive range of motion;Spinal Manipulations;Dry needling;Joint Manipulations;Orthotic Fit/Training;Stair training;Energy conservation;Functional mobility training;Splinting;Electrical Stimulation;Iontophoresis 4mg /ml Dexamethasone;Moist Heat;Traction;Therapeutic exercise;Balance training;Manual lymph drainage;Manual techniques;Vasopneumatic Device;Vestibular;Taping;Therapeutic activities;Fluidtherapy;Parrafin;Ultrasound;Contrast Bath    PT Next Visit Plan Progress core strength and lumbar mobility as tolerated. Continue standing exercises.    PT Home  Exercise Plan Eval: ab set, glute set, 7/6 SLR, bridge. LTR    Consulted and Agree with Plan of Care Patient             Patient will benefit from skilled therapeutic intervention in order to improve the following deficits and impairments:  Abnormal gait, Hypomobility, Decreased scar mobility, Decreased activity tolerance, Decreased strength, Pain, Increased fascial restricitons, Decreased mobility, Difficulty walking, Impaired flexibility, Decreased range of motion, Improper body mechanics  Visit Diagnosis: Low back pain, unspecified back pain laterality, unspecified chronicity, unspecified whether sciatica present  Other abnormalities of gait  and mobility  Chronic midline low back pain with bilateral sciatica  Difficulty in walking, not elsewhere classified     Problem List Patient Active Problem List   Diagnosis Date Noted   SBO (small bowel obstruction) ([place]) 09/24/2017   10:39 AM, 06/04/21 Josue Hector PT DPT  Physical Therapist with [facility]  [phone] [place] [address] [place], [place], [address] Phone: [phone]   Fax:  [phone]  Name: [name] MRN: [id number] Date of Birth: [birth year]

## 2021-06-09 ENCOUNTER — Encounter (HOSPITAL_COMMUNITY): Payer: Self-pay | Admitting: Physical Therapy

## 2021-06-09 ENCOUNTER — Ambulatory Visit (HOSPITAL_COMMUNITY): Payer: BC Managed Care – PPO | Admitting: Physical Therapy

## 2021-06-09 ENCOUNTER — Other Ambulatory Visit: Payer: Self-pay

## 2021-06-09 DIAGNOSIS — R2689 Other abnormalities of gait and mobility: Secondary | ICD-10-CM | POA: Diagnosis not present

## 2021-06-09 DIAGNOSIS — G8929 Other chronic pain: Secondary | ICD-10-CM

## 2021-06-09 DIAGNOSIS — R262 Difficulty in walking, not elsewhere classified: Secondary | ICD-10-CM

## 2021-06-09 DIAGNOSIS — M5441 Lumbago with sciatica, right side: Secondary | ICD-10-CM

## 2021-06-09 DIAGNOSIS — M5442 Lumbago with sciatica, left side: Secondary | ICD-10-CM | POA: Diagnosis not present

## 2021-06-09 DIAGNOSIS — M545 Low back pain, unspecified: Secondary | ICD-10-CM

## 2021-06-09 DIAGNOSIS — Z9889 Other specified postprocedural states: Secondary | ICD-10-CM | POA: Diagnosis not present

## 2021-06-09 NOTE — Therapy (Signed)
[place], [place], [address] Phone: [phone]   Fax:  [phone]  Physical Therapy Treatment  Patient Details  Name: [name] MRN: [id number] Date of Birth: 23-Feb-1961 Referring Provider (PT): Rema Jasmine NP   Encounter Date: 06/09/2021   PT End of Session - 06/09/21 1122     Visit Number 6    Number of Visits 8    Date for PT Re-Evaluation 06/11/21    Authorization Type [facility], BCBS COMM PPO secondary    PT Start Time 1118    PT Stop Time 1158    PT Time Calculation (min) 40 min    Activity Tolerance Patient tolerated treatment well    Behavior During Therapy [facility] for tasks assessed/performed             History reviewed. No pertinent past medical history.  Past Surgical History:  Procedure Laterality Date   ANTERIOR CERVICAL DISCECTOMY  1999   CERVICAL FUSION  2010, 2012   [place]   right   intestinal obstruction  2010   OTHER SURGICAL HISTORY  1966   sternum surgery   ribs removed as child  1967   thornwaldt cyst removed     [place]  2012   ULNA OSTEOTOMY Right 10/09/2014   Procedure: ULNAR SHORTENING;  Surgeon: Daryll Brod, MD;  Location: [place];  Service: Orthopedics;  Laterality: Right;   WRIST ARTHROSCOPY Right 10/09/2014   Procedure: RIGHT WRIST ARTHROSCOPY WITH ULNA SHORTENING OSTEOTOMY;  Surgeon: Daryll Brod, MD;  Location: [place];  Service: Orthopedics;  Laterality: Right;    There were no vitals filed for this visit.   Subjective Assessment - 06/09/21 1122     Subjective Patient reports his back is getting worse. He cleaned out his garage, sweeping and moving some things. Notes increased "shock spikes" in his back.    Pertinent History 5 neck surgeries, 2 back surgeries, most recent lumbar fusion 4.26.22    Limitations Lifting;Standing;Walking;House hold  activities    Patient Stated Goals Be less in pain    Currently in Pain? Yes    Pain Score 7     Pain Location Back    Pain Orientation Posterior;Lower    Pain Descriptors / Indicators Aching;Burning;Sharp;Stabbing    Pain Type Chronic pain    Pain Onset More than a month ago    Pain Frequency Constant                               [facility] Adult PT Treatment/Exercise - 06/09/21 0001       Lumbar Exercises: Stretches   Active Hamstring Stretch Right;Left   15 reps each   Lower Trunk Rotation 5 reps;10 seconds    Other Lumbar Stretch Exercise Mod thomas stretch RT 2 x 30"      Lumbar Exercises: Supine   Ab Set 10 reps;5 seconds    Bent Knee Raise 10 reps    Bridge 10 reps;5 seconds    Straight Leg Raise 10 reps      Lumbar Exercises: Prone   Straight Leg Raise 10 reps                      PT Short Term Goals - 05/14/21 1508       PT SHORT TERM GOAL #1  Title Patient will be independent with initial HEP and self-management strategies to improve functional outcomes    Time 2    Period Weeks    Status New    Target Date 05/29/21               PT Long Term Goals - 05/14/21 1509       PT LONG TERM GOAL #1   Title Patient will improve FOTO score to predicted value to indicate improvement in functional outcomes    Time 4    Period Weeks    Status New    Target Date 06/11/21      PT LONG TERM GOAL #2   Title Patient will be independent with advanced HEP and self-management strategies to improve functional outcomes    Time 4    Period Weeks    Status New    Target Date 06/11/21      PT LONG TERM GOAL #3   Title Patient will have equal to or > 4+/5 MMT throughout BLE to improve ability to perform functional mobility, stair ambulation and ADLs.    Time 4    Period Weeks    Status New    Target Date 06/11/21      PT LONG TERM GOAL #4   Title Patient will improve lumbar AROM by 25% in all restricted planes for improved ability  to perform functional mobility tasks and ADLs.    Time 4    Period Weeks    Status New    Target Date 06/11/21                   Plan - 06/09/21 1201     Clinical Impression Statement Patient tolerated session well. Progressed table strengthening exercise for LE strength and hip/ lumbar mobility. Added prone hip extension, Thomas stretch and active hamstring stretching. Issued updated HEP handout. Patient educated on purpose and function of all added activity. Will continue to progress as tolerated. Reassessment next visit.    Personal Factors and Comorbidities Time since onset of injury/illness/exacerbation;Past/Current Experience;Comorbidity 2    Examination-Activity Limitations Locomotion Level;Transfers;Stand;Squat;Bend    Examination-Participation Restrictions Community Activity;Occupation;Yard Work    Stability/Clinical Decision Making Stable/Uncomplicated    Rehab Potential Fair    PT Frequency 2x / week    PT Duration 4 weeks    PT Treatment/Interventions ADLs/Self Care Home Management;Aquatic Therapy;Cryotherapy;Biofeedback;Gait training;DME Instruction;Patient/family education;Neuromuscular re-education;Compression bandaging;Scar mobilization;Visual/perceptual remediation/compensation;Passive range of motion;Spinal Manipulations;Dry needling;Joint Manipulations;Orthotic Fit/Training;Stair training;Energy conservation;Functional mobility training;Splinting;Electrical Stimulation;Iontophoresis 4mg /ml Dexamethasone;Moist Heat;Traction;Therapeutic exercise;Balance training;Manual lymph drainage;Manual techniques;Vasopneumatic Device;Vestibular;Taping;Therapeutic activities;Fluidtherapy;Parrafin;Ultrasound;Contrast Bath    PT Next Visit Plan Reassess next visit. Progress core strength and lumbar mobility as tolerated.    PT Home Exercise Plan Eval: ab set, glute set, 7/6 SLR, bridge. LTR 7/18 active hamstring stretch, thomas stretch, prone hip extension    Consulted and Agree with  Plan of Care Patient             Patient will benefit from skilled therapeutic intervention in order to improve the following deficits and impairments:  Abnormal gait, Hypomobility, Decreased scar mobility, Decreased activity tolerance, Decreased strength, Pain, Increased fascial restricitons, Decreased mobility, Difficulty walking, Impaired flexibility, Decreased range of motion, Improper body mechanics  Visit Diagnosis: Low back pain, unspecified back pain laterality, unspecified chronicity, unspecified whether sciatica present  Other abnormalities of gait and mobility  Chronic midline low back pain with bilateral sciatica  Difficulty in walking, not elsewhere classified     Problem List Patient Active Problem  List   Diagnosis Date Noted   SBO (small bowel obstruction) ([place]) 09/24/2017   12:05 PM, 06/09/21 Josue Hector PT DPT  Physical Therapist with [facility]  [phone] [place] [address] [place], [place], [address] Phone: [phone]   Fax:  [phone]  Name: SHAHID FLORI MRN: [id number] Date of Birth: 1961-08-24

## 2021-06-09 NOTE — Patient Instructions (Signed)
Access Code: [email or web address] URL: [email or web address] Date: 06/09/2021 Prepared by: Josue Hector  Exercises Modified Marcello Moores Stretch - 2 x daily - 7 x weekly - 1 sets - 5 reps - 30 second hold Prone Hip Extension - 2 x daily - 7 x weekly - 2 sets - 10 reps Hooklying Active Hamstring Stretch - 2 x daily - 7 x weekly - 1 sets - 10 reps - 5 second hold

## 2021-06-10 DIAGNOSIS — R1310 Dysphagia, unspecified: Secondary | ICD-10-CM | POA: Diagnosis not present

## 2021-06-10 DIAGNOSIS — G4733 Obstructive sleep apnea (adult) (pediatric): Secondary | ICD-10-CM | POA: Diagnosis not present

## 2021-06-10 DIAGNOSIS — R410 Disorientation, unspecified: Secondary | ICD-10-CM | POA: Diagnosis not present

## 2021-06-10 DIAGNOSIS — G3184 Mild cognitive impairment, so stated: Secondary | ICD-10-CM | POA: Diagnosis not present

## 2021-06-11 ENCOUNTER — Other Ambulatory Visit: Payer: Self-pay

## 2021-06-11 ENCOUNTER — Ambulatory Visit (HOSPITAL_COMMUNITY): Payer: BC Managed Care – PPO | Admitting: Physical Therapy

## 2021-06-11 ENCOUNTER — Encounter (HOSPITAL_COMMUNITY): Payer: Self-pay | Admitting: Physical Therapy

## 2021-06-11 DIAGNOSIS — M545 Low back pain, unspecified: Secondary | ICD-10-CM

## 2021-06-11 DIAGNOSIS — R262 Difficulty in walking, not elsewhere classified: Secondary | ICD-10-CM | POA: Diagnosis not present

## 2021-06-11 DIAGNOSIS — R2689 Other abnormalities of gait and mobility: Secondary | ICD-10-CM | POA: Diagnosis not present

## 2021-06-11 DIAGNOSIS — M5442 Lumbago with sciatica, left side: Secondary | ICD-10-CM | POA: Diagnosis not present

## 2021-06-11 DIAGNOSIS — G8929 Other chronic pain: Secondary | ICD-10-CM

## 2021-06-11 DIAGNOSIS — Z9889 Other specified postprocedural states: Secondary | ICD-10-CM | POA: Diagnosis not present

## 2021-06-11 DIAGNOSIS — M5441 Lumbago with sciatica, right side: Secondary | ICD-10-CM | POA: Diagnosis not present

## 2021-06-11 NOTE — Therapy (Signed)
[place], [place], [address] Phone: [phone]   Fax:  [phone]  Physical Therapy Treatment  Patient Details  Name: [name] MRN: [id number] Date of Birth: 1961/08/05 Referring Provider (PT): Rema Jasmine NP  Progress Note Reporting Period 05/14/21 to 06/11/21  See note below for Objective Data and Assessment of Progress/Goals.     Encounter Date: 06/11/2021   PT End of Session - 06/11/21 1135     Visit Number 7    Number of Visits 15    Date for PT Re-Evaluation 07/09/21    Authorization Type [facility], BCBS COMM PPO secondary    PT Start Time 1120    PT Stop Time 1200    PT Time Calculation (min) 40 min    Activity Tolerance Patient tolerated treatment well    Behavior During Therapy [facility] for tasks assessed/performed             History reviewed. No pertinent past medical history.  Past Surgical History:  Procedure Laterality Date   ANTERIOR CERVICAL DISCECTOMY  1999   CERVICAL FUSION  2010, 2012   [place]   right   intestinal obstruction  2010   OTHER SURGICAL HISTORY  1966   sternum surgery   ribs removed as child  1967   thornwaldt cyst removed     [place]  2012   ULNA OSTEOTOMY Right 10/09/2014   Procedure: ULNAR SHORTENING;  Surgeon: Daryll Brod, MD;  Location: [place];  Service: Orthopedics;  Laterality: Right;   WRIST ARTHROSCOPY Right 10/09/2014   Procedure: RIGHT WRIST ARTHROSCOPY WITH ULNA SHORTENING OSTEOTOMY;  Surgeon: Daryll Brod, MD;  Location: [place];  Service: Orthopedics;  Laterality: Right;    There were no vitals filed for this visit.   Subjective Assessment - 06/11/21 1122     Subjective Patient says he feels he can do exercises but is not sure about his RT leg handling exercise progressions but he is willing to try. He still has some burning pain in his  LT hip/ inguinal area that is intermittent with activity. RT leg fatigues easily and feels burning in thigh with activity. Feels that the LT side of his core is paralyzed. Reports no significant improvement since starting therapy "I would say it's about the same but the workouts are good" "I'm more fit than I look"    Pertinent History 5 neck surgeries, 2 back surgeries, most recent lumbar fusion 4.26.22    Limitations Lifting;Standing;Walking;House hold activities    Patient Stated Goals Be less in pain    Currently in Pain? Yes    Pain Score 6     Pain Location Back    Pain Orientation Posterior;Lower    Pain Descriptors / Indicators Aching;Burning;Stabbing;Sharp    Pain Type Chronic pain    Pain Onset More than a month ago    Pain Frequency Constant    Aggravating Factors  stanidng, walking, lifting    Pain Relieving Factors "It dont stop hurting"    Effect of Pain on Daily Activities Limits                [facility] PT Assessment - 06/11/21 0001       Assessment   Medical Diagnosis Lumbar fusion    Referring Provider (PT) Rema Jasmine NP    Onset Date/Surgical Date 03/18/21    Prior Therapy Yes  Balance Screen   Has the patient fallen in the past 6 months No      Prior Function   Level of Independence Independent with basic ADLs      Cognition   Overall Cognitive Status Within Functional Limits for tasks assessed      Observation/Other Assessments   Focus on Therapeutic Outcomes (FOTO)  31% function      AROM   Lumbar Flexion WFL   was 20% limited   Lumbar Extension 75% limited   was 90% limited   Lumbar - Right Side Bend 25% limited   was 50% limited   Lumbar - Left Side Bend 25% limited   was 50% limited     Strength   Right Hip Flexion 5/5   was 4+   Right Hip Extension 4/5   was 3-   Right Hip ABduction 5/5   was 4+   Left Hip Flexion 5/5   was 4   Left Hip Extension 4/5   was 3-   Left Hip ABduction 4+/5   was 4-   Right Knee Extension 5/5    Left  Knee Extension 5/5    Right Ankle Dorsiflexion 5/5   was 4+   Left Ankle Dorsiflexion 5/5   was 4+     Palpation   Palpation comment Mod TTP about bilateral lumbar paraspinals, and bilat SI sulcus                           [facility] Adult PT Treatment/Exercise - 06/11/21 0001       Manual Therapy   Manual Therapy Soft tissue mobilization    Manual therapy comments all manual interventions performed independently of other interventions.     Soft tissue mobilization IASTM to bilateral lumbar paraspinals patient in prone                      PT Short Term Goals - 06/11/21 1135       PT SHORT TERM GOAL #1   Title Patient will be independent with initial HEP and self-management strategies to improve functional outcomes    Baseline Reports compliance    Time 2    Period Weeks    Status Achieved    Target Date 05/29/21               PT Long Term Goals - 06/11/21 1136       PT LONG TERM GOAL #1   Title Patient will improve FOTO score to predicted value to indicate improvement in functional outcomes    Baseline Current 31% function    Time 4    Period Weeks    Status On-going      PT LONG TERM GOAL #2   Title Patient will be independent with advanced HEP and self-management strategies to improve functional outcomes    Time 4    Period Weeks    Status On-going      PT LONG TERM GOAL #3   Title Patient will have equal to or > 4+/5 MMT throughout BLE to improve ability to perform functional mobility, stair ambulation and ADLs.    Baseline See MMT    Time 4    Period Weeks    Status Achieved      PT LONG TERM GOAL #4   Title Patient will improve lumbar AROM by 25% in all restricted planes for improved ability to perform functional mobility tasks and   ADLs.    Baseline See AROM    Time 4    Period Weeks    Status Partially Met                   Plan - 06/11/21 1158     Clinical Impression Statement Patient showing improvement  despite reporting minimal subjective progress. Patient shows improved AROM and MMT. Slight decrease in tenderness about lumbar. Patient remains limited by ROM restriction, improper body mechanics, and fascial restrictions which continue to contribute to pain and are negatively impacting functional ability. Patient will continue to benefit from skilled therapy services to address remaining deficits for decreased pain an improved functional ability.    Personal Factors and Comorbidities Time since onset of injury/illness/exacerbation;Past/Current Experience;Comorbidity 2    Examination-Activity Limitations Locomotion Level;Transfers;Stand;Squat;Bend    Examination-Participation Restrictions Community Activity;Occupation;Yard Work    Stability/Clinical Decision Making Stable/Uncomplicated    Rehab Potential Fair    PT Frequency 2x / week    PT Duration 4 weeks    PT Treatment/Interventions ADLs/Self Care Home Management;Aquatic Therapy;Cryotherapy;Biofeedback;Gait training;DME Instruction;Patient/family education;Neuromuscular re-education;Compression bandaging;Scar mobilization;Visual/perceptual remediation/compensation;Passive range of motion;Spinal Manipulations;Dry needling;Joint Manipulations;Orthotic Fit/Training;Stair training;Energy conservation;Functional mobility training;Splinting;Electrical Stimulation;Iontophoresis 55m/ml Dexamethasone;Moist Heat;Traction;Therapeutic exercise;Balance training;Manual lymph drainage;Manual techniques;Vasopneumatic Device;Vestibular;Taping;Therapeutic activities;Fluidtherapy;Parrafin;Ultrasound;Contrast Bath    PT Next Visit Plan Progress core strength and lumbar mobility as tolerated. Add standing hip abduction/ extension band resisited, resisited sidestepping, palloff press. Assess response to previous manuals    PT Home Exercise Plan Eval: ab set, glute set, 7/6 SLR, bridge. LTR 7/18 active hamstring stretch, thomas stretch, prone hip extension    Consulted and  Agree with Plan of Care Patient             Patient will benefit from skilled therapeutic intervention in order to improve the following deficits and impairments:  Abnormal gait, Hypomobility, Decreased scar mobility, Decreased activity tolerance, Decreased strength, Pain, Increased fascial restricitons, Decreased mobility, Difficulty walking, Impaired flexibility, Decreased range of motion, Improper body mechanics  Visit Diagnosis: Low back pain, unspecified back pain laterality, unspecified chronicity, unspecified whether sciatica present  Other abnormalities of gait and mobility  Chronic midline low back pain with bilateral sciatica  Difficulty in walking, not elsewhere classified     Problem List Patient Active Problem List   Diagnosis Date Noted   [place] 09/24/2017   12:06 PM, 06/11/21 CJosue HectorPT DPT  [facility] [phone] [address] [address]Phone: [phone]  Fax:  [phone] Name: Douglas EBERWEINMRN: [id number] of Birth: [birth year]

## 2021-06-17 ENCOUNTER — Encounter (HOSPITAL_COMMUNITY): Payer: BC Managed Care – PPO | Admitting: Physical Therapy

## 2021-06-19 ENCOUNTER — Encounter (HOSPITAL_COMMUNITY): Payer: BC Managed Care – PPO | Admitting: Physical Therapy

## 2021-06-23 ENCOUNTER — Encounter (HOSPITAL_COMMUNITY): Payer: Self-pay | Admitting: Physical Therapy

## 2021-06-23 ENCOUNTER — Other Ambulatory Visit: Payer: Self-pay

## 2021-06-23 ENCOUNTER — Ambulatory Visit (HOSPITAL_COMMUNITY): Payer: BC Managed Care – PPO | Attending: Adult Health Nurse Practitioner | Admitting: Physical Therapy

## 2021-06-23 DIAGNOSIS — R262 Difficulty in walking, not elsewhere classified: Secondary | ICD-10-CM | POA: Insufficient documentation

## 2021-06-23 DIAGNOSIS — M5441 Lumbago with sciatica, right side: Secondary | ICD-10-CM | POA: Diagnosis not present

## 2021-06-23 DIAGNOSIS — R2689 Other abnormalities of gait and mobility: Secondary | ICD-10-CM | POA: Insufficient documentation

## 2021-06-23 DIAGNOSIS — G8929 Other chronic pain: Secondary | ICD-10-CM | POA: Diagnosis not present

## 2021-06-23 DIAGNOSIS — M5442 Lumbago with sciatica, left side: Secondary | ICD-10-CM | POA: Insufficient documentation

## 2021-06-23 DIAGNOSIS — M545 Low back pain, unspecified: Secondary | ICD-10-CM | POA: Insufficient documentation

## 2021-06-23 NOTE — Patient Instructions (Signed)
Access Code: [email or web address] URL: [email or web address] Date: 06/23/2021 Prepared by: Mitzi Hansen Kambrea Carrasco  Exercises Hip Abduction with Resistance Loop - 1 x daily - 7 x weekly - 2 sets - 10 reps Hip Extension with Resistance Loop - 1 x daily - 7 x weekly - 2 sets - 10 reps

## 2021-06-23 NOTE — Therapy (Signed)
[place], [place], [address] Phone: [phone]   Fax:  [phone]  Physical Therapy Treatment  Patient Details  Name: PENN GRISSETT MRN: [id number] Date of Birth: 31-Aug-1961 Referring Provider (PT): Rema Jasmine NP   Encounter Date: 06/23/2021   PT End of Session - 06/23/21 0744     Visit Number 8    Number of Visits 15    Date for PT Re-Evaluation 07/09/21    Authorization Type [employer], BCBS COMM PPO secondary    PT Start Time [phone]    PT Stop Time 0825    PT Time Calculation (min) 40 min    Activity Tolerance Patient tolerated treatment well    Behavior During Therapy [facility] for tasks assessed/performed             History reviewed. No pertinent past medical history.  Past Surgical History:  Procedure Laterality Date   ANTERIOR CERVICAL DISCECTOMY  1999   CERVICAL FUSION  2010, 2012   [place]   right   intestinal obstruction  2010   OTHER SURGICAL HISTORY  1966   sternum surgery   ribs removed as child  1967   thornwaldt cyst removed     [place]  2012   ULNA OSTEOTOMY Right 10/09/2014   Procedure: ULNAR SHORTENING;  Surgeon: Daryll Brod, MD;  Location: [place];  Service: Orthopedics;  Laterality: Right;   WRIST ARTHROSCOPY Right 10/09/2014   Procedure: RIGHT WRIST ARTHROSCOPY WITH ULNA SHORTENING OSTEOTOMY;  Surgeon: Daryll Brod, MD;  Location: [place];  Service: Orthopedics;  Laterality: Right;    There were no vitals filed for this visit.   Subjective Assessment - 06/23/21 0745     Subjective Patient states area of symptoms has raised up a notch since surgery and is a little higher in location. He states his back really doesn't feel any better. Since starting therapy he has been getting some spike pain in his back. His HEP is going alright. He does have history of abdominal surgery and  limited abdominal activation on L side.    Pertinent History 5 neck surgeries, 2 back surgeries, most recent lumbar fusion 4.26.22    Limitations Lifting;Standing;Walking;House hold activities    Patient Stated Goals Be less in pain    Currently in Pain? Yes    Pain Score 7     Pain Location Back    Pain Orientation Lower    Pain Descriptors / Indicators Aching    Pain Type Chronic pain    Pain Onset More than a month ago    Pain Frequency Constant                               [facility] Adult PT Treatment/Exercise - 06/23/21 0001       Lumbar Exercises: Stretches   Other Lumbar Stretch Exercise seated thoracic extension over chair 10x 5 second holds      Lumbar Exercises: Standing   Other Standing Lumbar Exercises standing hip abduction and extension 2x 10 each green band    Other Standing Lumbar Exercises palof green band 2x 10 bilateral; shoulder row and extensions 2x10 green band                    PT Education - 06/23/21 0745     Education Details  HEP, surgical history in relation to symptoms, beginning walking program    Person(s) Educated Patient    Methods Explanation;Demonstration    Comprehension Verbalized understanding;Returned demonstration              PT Short Term Goals - 06/11/21 1135       PT SHORT TERM GOAL #1   Title Patient will be independent with initial HEP and self-management strategies to improve functional outcomes    Baseline Reports compliance    Time 2    Period Weeks    Status Achieved    Target Date 05/29/21               PT Long Term Goals - 06/11/21 1136       PT LONG TERM GOAL #1   Title Patient will improve FOTO score to predicted value to indicate improvement in functional outcomes    Baseline Current 31% function    Time 4    Period Weeks    Status On-going      PT LONG TERM GOAL #2   Title Patient will be independent with advanced HEP and self-management strategies to improve  functional outcomes    Time 4    Period Weeks    Status On-going      PT LONG TERM GOAL #3   Title Patient will have equal to or > 4+/5 MMT throughout BLE to improve ability to perform functional mobility, stair ambulation and ADLs.    Baseline See MMT    Time 4    Period Weeks    Status Achieved      PT LONG TERM GOAL #4   Title Patient will improve lumbar AROM by 25% in all restricted planes for improved ability to perform functional mobility tasks and ADLs.    Baseline See AROM    Time 4    Period Weeks    Status Partially Met                   Plan - 06/23/21 0744     Clinical Impression Statement Patient continues to c/o lower thoracic and upper lumbar symptoms. Began session with thoracic extension stretch without change in symptoms. Continued with core and hip strengthening today. Patient tolerates addition of resistance with standing hip exercises. Began resisted postural strengthening today which patient completes with good mechanics following initial demonstration. Patient will continue to benefit from skilled physical therapy in order to reduce impairment and improve function.    Personal Factors and Comorbidities Time since onset of injury/illness/exacerbation;Past/Current Experience;Comorbidity 2    Examination-Activity Limitations Locomotion Level;Transfers;Stand;Squat;Bend    Examination-Participation Restrictions Community Activity;Occupation;Yard Work    Stability/Clinical Decision Making Stable/Uncomplicated    Rehab Potential Fair    PT Frequency 2x / week    PT Duration 4 weeks    PT Treatment/Interventions ADLs/Self Care Home Management;Aquatic Therapy;Cryotherapy;Biofeedback;Gait training;DME Instruction;Patient/family education;Neuromuscular re-education;Compression bandaging;Scar mobilization;Visual/perceptual remediation/compensation;Passive range of motion;Spinal Manipulations;Dry needling;Joint Manipulations;Orthotic Fit/Training;Stair  training;Energy conservation;Functional mobility training;Splinting;Electrical Stimulation;Iontophoresis 34m/ml Dexamethasone;Moist Heat;Traction;Therapeutic exercise;Balance training;Manual lymph drainage;Manual techniques;Vasopneumatic Device;Vestibular;Taping;Therapeutic activities;Fluidtherapy;Parrafin;Ultrasound;Contrast Bath    PT Next Visit Plan Progress core strength and lumbar mobility as tolerated. Add standing hip abduction/ extension band resisited, resisited sidestepping, palloff press. Assess response to previous manuals    PT Home Exercise Plan Eval: ab set, glute set, 7/6 SLR, bridge. LTR 7/18 active hamstring stretch, thomas stretch, prone hip extension 8/1 hip abd/ ext with band    Consulted and Agree with Plan of Care Patient  Patient will benefit from skilled therapeutic intervention in order to improve the following deficits and impairments:  Abnormal gait, Hypomobility, Decreased scar mobility, Decreased activity tolerance, Decreased strength, Pain, Increased fascial restricitons, Decreased mobility, Difficulty walking, Impaired flexibility, Decreased range of motion, Improper body mechanics  Visit Diagnosis: Low back pain, unspecified back pain laterality, unspecified chronicity, unspecified whether sciatica present  Other abnormalities of gait and mobility  Chronic midline low back pain with bilateral sciatica  Difficulty in walking, not elsewhere classified     Problem List Patient Active Problem List   Diagnosis Date Noted   SBO (small bowel obstruction) ([place]) 09/24/2017    8:27 AM, 06/23/21 Mearl Latin PT, DPT Physical Therapist at [place], [place], [address] Phone: [phone]   Fax:  [phone]  Name: RAINEY KAHRS MRN: [id number] Date of Birth: Jun 25, 1961

## 2021-06-25 DIAGNOSIS — G40209 Localization-related (focal) (partial) symptomatic epilepsy and epileptic syndromes with complex partial seizures, not intractable, without status epilepticus: Secondary | ICD-10-CM | POA: Diagnosis not present

## 2021-06-26 ENCOUNTER — Ambulatory Visit (HOSPITAL_COMMUNITY): Payer: BC Managed Care – PPO | Admitting: Physical Therapy

## 2021-06-26 ENCOUNTER — Other Ambulatory Visit: Payer: Self-pay

## 2021-06-26 DIAGNOSIS — M5441 Lumbago with sciatica, right side: Secondary | ICD-10-CM | POA: Diagnosis not present

## 2021-06-26 DIAGNOSIS — G8929 Other chronic pain: Secondary | ICD-10-CM

## 2021-06-26 DIAGNOSIS — M545 Low back pain, unspecified: Secondary | ICD-10-CM

## 2021-06-26 DIAGNOSIS — R2689 Other abnormalities of gait and mobility: Secondary | ICD-10-CM

## 2021-06-26 DIAGNOSIS — M5442 Lumbago with sciatica, left side: Secondary | ICD-10-CM

## 2021-06-26 DIAGNOSIS — R262 Difficulty in walking, not elsewhere classified: Secondary | ICD-10-CM | POA: Diagnosis not present

## 2021-06-26 NOTE — Therapy (Signed)
[place], [place], [address] Phone: [phone]   Fax:  [phone]  Physical Therapy Treatment  Patient Details  Name: [name] MRN: [id number] Date of Birth: 1961/01/26 Referring Provider (PT): Rema Jasmine NP   Encounter Date: 06/26/2021   PT End of Session - 06/26/21 0933     Visit Number 9    Number of Visits 15    Date for PT Re-Evaluation 07/09/21    Authorization Type [facility], BCBS COMM PPO secondary    Progress Note Due on Visit 17    PT Start Time 0830    PT Stop Time 0914    PT Time Calculation (min) 44 min    Activity Tolerance Patient tolerated treatment well    Behavior During Therapy [facility] for tasks assessed/performed             No past medical history on file.  Past Surgical History:  Procedure Laterality Date   ANTERIOR CERVICAL DISCECTOMY  1999   CERVICAL FUSION  2010, 2012   [place]   right   intestinal obstruction  2010   OTHER SURGICAL HISTORY  1966   sternum surgery   ribs removed as child  1967   thornwaldt cyst removed     [place]  2012   ULNA OSTEOTOMY Right 10/09/2014   Procedure: ULNAR SHORTENING;  Surgeon: Daryll Brod, MD;  Location: [place];  Service: Orthopedics;  Laterality: Right;   WRIST ARTHROSCOPY Right 10/09/2014   Procedure: RIGHT WRIST ARTHROSCOPY WITH ULNA SHORTENING OSTEOTOMY;  Surgeon: Daryll Brod, MD;  Location: [place];  Service: Orthopedics;  Laterality: Right;    There were no vitals filed for this visit.   Subjective Assessment - 06/26/21 0933     Subjective pt reports no significant difference in his pain.  States the manual work seemed to help when completed.                               [place] Adult PT Treatment/Exercise - 06/26/21 0001       Lumbar Exercises: Standing   Forward Lunge 20 reps;Limitations     Forward Lunge Limitations 2 sets of 10 reps onto 4" box no UE's    Other Standing Lumbar Exercises standing hip abduction and extension 2x 10 each green band    Other Standing Lumbar Exercises paloff green band 2x 10 bilateral; shoulder retractions,  row and extensions 2x10 green band      Manual Therapy   Manual Therapy Soft tissue mobilization    Manual therapy comments all manual interventions performed independently of other interventions.     Soft tissue mobilization bilateral lumbar paraspinals patient in prone                      PT Short Term Goals - 06/26/21 0927       PT SHORT TERM GOAL #1   Title Patient will be independent with initial HEP and self-management strategies to improve functional outcomes    Baseline Reports compliance    Time 2    Period Weeks    Status On-going    Target Date 05/29/21               PT Long Term Goals - 06/11/21 1136       PT  LONG TERM GOAL #1   Title Patient will improve FOTO score to predicted value to indicate improvement in functional outcomes    Baseline Current 31% function    Time 4    Period Weeks    Status On-going      PT LONG TERM GOAL #2   Title Patient will be independent with advanced HEP and self-management strategies to improve functional outcomes    Time 4    Period Weeks    Status On-going      PT LONG TERM GOAL #3   Title Patient will have equal to or > 4+/5 MMT throughout BLE to improve ability to perform functional mobility, stair ambulation and ADLs.    Baseline See MMT    Time 4    Period Weeks    Status Achieved      PT LONG TERM GOAL #4   Title Patient will improve lumbar AROM by 25% in all restricted planes for improved ability to perform functional mobility tasks and ADLs.    Baseline See AROM    Time 4    Period Weeks    Status Partially Met                   Plan - 06/26/21 1106     Clinical Impression Statement Continued with LE and postural strengthening.   Pt required verbal and tactile cues for form and holds of therex.    Good stability noted with paloff today.  Manual completed in prone lying with tightness noted bilateral lumbar paraspinals.  Reduced to 3/10 following manual.  Instructed to continue self manual and heat as needed at home to reduce pain while continuing HEP.    Personal Factors and Comorbidities Time since onset of injury/illness/exacerbation;Past/Current Experience;Comorbidity 2    Examination-Activity Limitations Locomotion Level;Transfers;Stand;Squat;Bend    Examination-Participation Restrictions Community Activity;Occupation;Yard Work    Stability/Clinical Decision Making Stable/Uncomplicated    Rehab Potential Fair    PT Frequency 2x / week    PT Duration 4 weeks    PT Treatment/Interventions ADLs/Self Care Home Management;Aquatic Therapy;Cryotherapy;Biofeedback;Gait training;DME Instruction;Patient/family education;Neuromuscular re-education;Compression bandaging;Scar mobilization;Visual/perceptual remediation/compensation;Passive range of motion;Spinal Manipulations;Dry needling;Joint Manipulations;Orthotic Fit/Training;Stair training;Energy conservation;Functional mobility training;Splinting;Electrical Stimulation;Iontophoresis 60m/ml Dexamethasone;Moist Heat;Traction;Therapeutic exercise;Balance training;Manual lymph drainage;Manual techniques;Vasopneumatic Device;Vestibular;Taping;Therapeutic activities;Fluidtherapy;Parrafin;Ultrasound;Contrast Bath    PT Next Visit Plan Progress core strength and lumbar mobility as tolerated. Continue manual to help reduce tightness and improve upon pain levels.    PT Home Exercise Plan Eval: ab set, glute set, 7/6 SLR, bridge. LTR 7/18 active hamstring stretch, thomas stretch, prone hip extension 8/1 hip abd/ ext with band    Consulted and Agree with Plan of Care Patient             Patient will benefit from skilled therapeutic intervention in order to improve the following deficits  and impairments:  Abnormal gait, Hypomobility, Decreased scar mobility, Decreased activity tolerance, Decreased strength, Pain, Increased fascial restricitons, Decreased mobility, Difficulty walking, Impaired flexibility, Decreased range of motion, Improper body mechanics  Visit Diagnosis: Low back pain, unspecified back pain laterality, unspecified chronicity, unspecified whether sciatica present  Other abnormalities of gait and mobility  Chronic midline low back pain with bilateral sciatica     Problem List Patient Active Problem List   Diagnosis Date Noted   [place]) [place] 09/24/2017   ATeena Irani PTA/CLT [id number] FRoseanne RenoB 06/26/2021, 11:07 AM  [place] [address]Phone: [phone]  Fax:  [phone]  Name: [name] MRN: [id number] Date of Birth: Dec 20, 1960

## 2021-07-02 ENCOUNTER — Encounter (HOSPITAL_COMMUNITY): Payer: Self-pay | Admitting: Physical Therapy

## 2021-07-02 ENCOUNTER — Other Ambulatory Visit: Payer: Self-pay

## 2021-07-02 ENCOUNTER — Ambulatory Visit (HOSPITAL_COMMUNITY): Payer: BC Managed Care – PPO | Admitting: Physical Therapy

## 2021-07-02 DIAGNOSIS — M545 Low back pain, unspecified: Secondary | ICD-10-CM | POA: Diagnosis not present

## 2021-07-02 DIAGNOSIS — R262 Difficulty in walking, not elsewhere classified: Secondary | ICD-10-CM | POA: Diagnosis not present

## 2021-07-02 DIAGNOSIS — G8929 Other chronic pain: Secondary | ICD-10-CM

## 2021-07-02 DIAGNOSIS — R2689 Other abnormalities of gait and mobility: Secondary | ICD-10-CM | POA: Diagnosis not present

## 2021-07-02 DIAGNOSIS — M5441 Lumbago with sciatica, right side: Secondary | ICD-10-CM | POA: Diagnosis not present

## 2021-07-02 DIAGNOSIS — M5442 Lumbago with sciatica, left side: Secondary | ICD-10-CM | POA: Diagnosis not present

## 2021-07-02 NOTE — Therapy (Signed)
[place], [place], [address] Phone: [phone]   Fax:  [phone]  Physical Therapy Treatment  Patient Details  Name: [name] MRN: [id number] Date of Birth: [birth year] Referring Provider (PT): Rema Jasmine NP   Encounter Date: 07/02/2021   PT End of Session - 07/02/21 1547     Visit Number 10    Number of Visits 15    Date for PT Re-Evaluation 07/09/21    Authorization Type [employer], BCBS COMM PPO secondary    Progress Note Due on Visit 17    PT Start Time 1545    PT Stop Time 1635    PT Time Calculation (min) 50 min    Activity Tolerance Patient tolerated treatment well    Behavior During Therapy [facility] for tasks assessed/performed             History reviewed. No pertinent past medical history.  Past Surgical History:  Procedure Laterality Date   ANTERIOR CERVICAL DISCECTOMY  1999   CERVICAL FUSION  2010, 2012   [place]   right   intestinal obstruction  2010   OTHER SURGICAL HISTORY  1966   sternum surgery   ribs removed as child  1967   thornwaldt cyst removed     [place]  2012   ULNA OSTEOTOMY Right 10/09/2014   Procedure: ULNAR SHORTENING;  Surgeon: Daryll Brod, MD;  Location: [place];  Service: Orthopedics;  Laterality: Right;   WRIST ARTHROSCOPY Right 10/09/2014   Procedure: RIGHT WRIST ARTHROSCOPY WITH ULNA SHORTENING OSTEOTOMY;  Surgeon: Daryll Brod, MD;  Location: [place];  Service: Orthopedics;  Laterality: Right;    There were no vitals filed for this visit.   Subjective Assessment - 07/02/21 1546     Subjective "Same old me" reports ongoing pain. States he feels STM has been helpful for incisional area.    Currently in Pain? Yes    Pain Score 6     Pain Location Back    Pain Orientation Lower;Posterior    Pain Descriptors / Indicators Aching    Pain Type Chronic  pain                               [facility] Adult PT Treatment/Exercise - 07/02/21 0001       Lumbar Exercises: Aerobic   Recumbent Bike 4 min warmup Lv 2      Lumbar Exercises: Standing   Functional Squats 20 reps    Functional Squats Limitations to chair for depth cue    Other Standing Lumbar Exercises standing hip abduction and extension 2x 10 each green band, 6 inch box power ups 2 x 10 each    Other Standing Lumbar Exercises paloff BTB 2x 10 bilateral; shoulder retractions,  row and extensions 2x10 BTB      Lumbar Exercises: Supine   Dead Bug 20 reps    Bridge 20 reps      Manual Therapy   Manual Therapy Soft tissue mobilization    Manual therapy comments all manual interventions performed independently of other interventions.     Soft tissue mobilization IASTM to bilateral lumbar paraspinals patient in prone                      PT Short Term Goals - 06/26/21 6301  PT SHORT TERM GOAL #1   Title Patient will be independent with initial HEP and self-management strategies to improve functional outcomes    Baseline Reports compliance    Time 2    Period Weeks    Status On-going    Target Date 05/29/21               PT Long Term Goals - 06/11/21 1136       PT LONG TERM GOAL #1   Title Patient will improve FOTO score to predicted value to indicate improvement in functional outcomes    Baseline Current 31% function    Time 4    Period Weeks    Status On-going      PT LONG TERM GOAL #2   Title Patient will be independent with advanced HEP and self-management strategies to improve functional outcomes    Time 4    Period Weeks    Status On-going      PT LONG TERM GOAL #3   Title Patient will have equal to or > 4+/5 MMT throughout BLE to improve ability to perform functional mobility, stair ambulation and ADLs.    Baseline See MMT    Time 4    Period Weeks    Status Achieved      PT LONG TERM GOAL #4   Title Patient  will improve lumbar AROM by 25% in all restricted planes for improved ability to perform functional mobility tasks and ADLs.    Baseline See AROM    Time 4    Period Weeks    Status Partially Met                   Plan - 07/02/21 1712     Clinical Impression Statement Patient tolerated session well and shows improving activity tolerance. Able to progress band resistance to blue thera band. Added power ups on 6 inch box for LE strength and balance. Patient reports no increased pain during session. Manual IASTM yielding improved subjective report of lower lumbar incisions. Patient will continue to benefit from skilled therapy services to reduce pain and improve functional ability.    Personal Factors and Comorbidities Time since onset of injury/illness/exacerbation;Past/Current Experience;Comorbidity 2    Examination-Activity Limitations Locomotion Level;Transfers;Stand;Squat;Bend    Examination-Participation Restrictions Community Activity;Occupation;Yard Work    Stability/Clinical Decision Making Stable/Uncomplicated    Rehab Potential Fair    PT Frequency 2x / week    PT Duration 4 weeks    PT Treatment/Interventions ADLs/Self Care Home Management;Aquatic Therapy;Cryotherapy;Biofeedback;Gait training;DME Instruction;Patient/family education;Neuromuscular re-education;Compression bandaging;Scar mobilization;Visual/perceptual remediation/compensation;Passive range of motion;Spinal Manipulations;Dry needling;Joint Manipulations;Orthotic Fit/Training;Stair training;Energy conservation;Functional mobility training;Splinting;Electrical Stimulation;Iontophoresis 58m/ml Dexamethasone;Moist Heat;Traction;Therapeutic exercise;Balance training;Manual lymph drainage;Manual techniques;Vasopneumatic Device;Vestibular;Taping;Therapeutic activities;Fluidtherapy;Parrafin;Ultrasound;Contrast Bath    PT Next Visit Plan Add machine strengthening, leg press, walkouts, maybe light box lifts. Progress core  strength and lumbar mobility as tolerated. Continue manual to help reduce tightness and improve upon pain levels.    PT Home Exercise Plan Eval: ab set, glute set, 7/6 SLR, bridge. LTR 7/18 active hamstring stretch, thomas stretch, prone hip extension 8/1 hip abd/ ext with band    Consulted and Agree with Plan of Care Patient             Patient will benefit from skilled therapeutic intervention in order to improve the following deficits and impairments:  Abnormal gait, Hypomobility, Decreased scar mobility, Decreased activity tolerance, Decreased strength, Pain, Increased fascial restricitons, Decreased mobility, Difficulty walking, Impaired flexibility, Decreased range of motion, Improper body mechanics  Visit Diagnosis: Low back pain, unspecified back pain laterality, unspecified chronicity, unspecified whether sciatica present  Other abnormalities of gait and mobility  Chronic midline low back pain with bilateral sciatica     Problem List Patient Active Problem List   Diagnosis Date Noted   SBO (small bowel obstruction) ([place]) 09/24/2017  5:18 PM, 07/02/21 Josue Hector PT DPT  Physical Therapist with [facility]  [phone] [place], [place], [address] Phone: [phone]   Fax:  [phone]  Name: VERL KITSON MRN: [id number] Date of Birth: [birth year]

## 2021-07-04 ENCOUNTER — Ambulatory Visit (HOSPITAL_COMMUNITY): Payer: BC Managed Care – PPO

## 2021-07-04 ENCOUNTER — Other Ambulatory Visit: Payer: Self-pay

## 2021-07-04 ENCOUNTER — Encounter (HOSPITAL_COMMUNITY): Payer: Self-pay

## 2021-07-04 DIAGNOSIS — M5442 Lumbago with sciatica, left side: Secondary | ICD-10-CM

## 2021-07-04 DIAGNOSIS — R262 Difficulty in walking, not elsewhere classified: Secondary | ICD-10-CM

## 2021-07-04 DIAGNOSIS — G8929 Other chronic pain: Secondary | ICD-10-CM | POA: Diagnosis not present

## 2021-07-04 DIAGNOSIS — R2689 Other abnormalities of gait and mobility: Secondary | ICD-10-CM | POA: Diagnosis not present

## 2021-07-04 DIAGNOSIS — M5441 Lumbago with sciatica, right side: Secondary | ICD-10-CM | POA: Diagnosis not present

## 2021-07-04 DIAGNOSIS — M545 Low back pain, unspecified: Secondary | ICD-10-CM | POA: Diagnosis not present

## 2021-07-04 NOTE — Therapy (Signed)
[place], [place], [address] Phone: [phone]   Fax:  [phone]  Physical Therapy Treatment  Patient Details  Name: JADA KUHNERT MRN: [id number] Date of Birth: 03-05-1961 Referring Provider (PT): Rema Jasmine NP   Encounter Date: 07/04/2021   PT End of Session - 07/04/21 1627     Visit Number 11    Number of Visits 15    Date for PT Re-Evaluation 07/09/21    Authorization Type [employer], BCBS COMM PPO secondary    Progress Note Due on Visit 17    PT Start Time 1619    PT Stop Time 1700    PT Time Calculation (min) 41 min    Activity Tolerance Patient tolerated treatment well    Behavior During Therapy [facility] for tasks assessed/performed             History reviewed. No pertinent past medical history.  Past Surgical History:  Procedure Laterality Date   ANTERIOR CERVICAL DISCECTOMY  1999   CERVICAL FUSION  2010, 2012   [place]   right   intestinal obstruction  2010   OTHER SURGICAL HISTORY  1966   sternum surgery   ribs removed as child  1967   thornwaldt cyst removed     [place]  2012   ULNA OSTEOTOMY Right 10/09/2014   Procedure: ULNAR SHORTENING;  Surgeon: Daryll Brod, MD;  Location: [place];  Service: Orthopedics;  Laterality: Right;   WRIST ARTHROSCOPY Right 10/09/2014   Procedure: RIGHT WRIST ARTHROSCOPY WITH ULNA SHORTENING OSTEOTOMY;  Surgeon: Daryll Brod, MD;  Location: [place];  Service: Orthopedics;  Laterality: Right;    There were no vitals filed for this visit.   Subjective Assessment - 07/04/21 1625     Subjective Pt stated pain is the same, pain scale 7/10.  Stated following each surgery it increases pain above the surgery spot.  Intermittent sharp pain with certain movements.  Pt also has pain in Bil feet.    Pertinent History 5 neck surgeries, 2 back surgeries, most  recent lumbar fusion 4.26.22    Patient Stated Goals Be less in pain    Currently in Pain? Yes    Pain Location Back    Pain Orientation Lower    Pain Descriptors / Indicators Aching    Pain Type Chronic pain    Pain Onset More than a month ago    Pain Frequency Constant    Aggravating Factors  standing, walking, lifting                               [facility] Adult PT Treatment/Exercise - 07/04/21 0001       Lumbar Exercises: Machines for Strengthening   Leg Press 3Pl 2x 10      Lumbar Exercises: Standing   Functional Squats 20 reps    Functional Squats Limitations cueing for mechanics to reduce anterior aspect of knee and reduce rounding back    Other Standing Lumbar Exercises paloff walk 5RT with BTB      Manual Therapy   Manual Therapy Soft tissue mobilization    Manual therapy comments all manual interventions performed independently of other interventions.     Soft tissue mobilization Bilateral lumbar paraspinals and QL in prone position  PT Education - 07/04/21 1632     Education Details Reviewed spinal alignement and reasoning for increased pain superior following fusion    Person(s) Educated Patient    Methods Explanation;Demonstration    Comprehension Verbalized understanding;Returned demonstration;Need further instruction              PT Short Term Goals - 06/26/21 0927       PT SHORT TERM GOAL #1   Title Patient will be independent with initial HEP and self-management strategies to improve functional outcomes    Baseline Reports compliance    Time 2    Period Weeks    Status On-going    Target Date 05/29/21               PT Long Term Goals - 06/11/21 1136       PT LONG TERM GOAL #1   Title Patient will improve FOTO score to predicted value to indicate improvement in functional outcomes    Baseline Current 31% function    Time 4    Period Weeks    Status On-going      PT LONG TERM GOAL #2    Title Patient will be independent with advanced HEP and self-management strategies to improve functional outcomes    Time 4    Period Weeks    Status On-going      PT LONG TERM GOAL #3   Title Patient will have equal to or > 4+/5 MMT throughout BLE to improve ability to perform functional mobility, stair ambulation and ADLs.    Baseline See MMT    Time 4    Period Weeks    Status Achieved      PT LONG TERM GOAL #4   Title Patient will improve lumbar AROM by 25% in all restricted planes for improved ability to perform functional mobility tasks and ADLs.    Baseline See AROM    Time 4    Period Weeks    Status Partially Met                   Plan - 07/04/21 1702     Clinical Impression Statement Session focus on core and proximal strengthening.  Pt with cueing to improve mechanics through session and educated importance of core strengthening to assist with lumbar support.  Pt able to demonstrate improved mechanics with squats and improved stability with paloff walk out exercise.  Added leg press for LE strengthening wtih good control noted.  EOS with manual STM to address overall tightness for mobility and pain control.    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, jewellery Low    Rehab Potential Fair    PT Frequency 2x / week    PT Duration 4 weeks    PT Treatment/Interventions ADLs/Self Care Home Management;Aquatic Therapy;Cryotherapy;Biofeedback;Gait training;DME Instruction;Patient/family education;Neuromuscular re-education;Compression bandaging;Scar mobilization;Visual/perceptual remediation/compensation;Passive range of motion;Spinal Manipulations;Dry  needling;Joint Manipulations;Orthotic Fit/Training;Stair training;Energy conservation;Functional mobility training;Splinting;Electrical Stimulation;Iontophoresis 20m/ml Dexamethasone;Moist Heat;Traction;Therapeutic exercise;Balance training;Manual lymph drainage;Manual techniques;Vasopneumatic Device;Vestibular;Taping;Therapeutic activities;Fluidtherapy;Parrafin;Ultrasound;Contrast Bath    PT Next Visit Plan Continue machine strengthening, leg press, walkouts.  Educate mechanics with proper lifting with light box.. Progress core strength and lumbar mobility as tolerated. Continue manual to help reduce tightness and improve upon pain levels.    PT Home Exercise Plan Eval: ab set, glute set, 7/6  SLR, bridge. LTR 7/18 active hamstring stretch, thomas stretch, prone hip extension 8/1 hip abd/ ext with band    Consulted and Agree with Plan of Care Patient             Patient will benefit from skilled therapeutic intervention in order to improve the following deficits and impairments:  Abnormal gait, Hypomobility, Decreased scar mobility, Decreased activity tolerance, Decreased strength, Pain, Increased fascial restricitons, Decreased mobility, Difficulty walking, Impaired flexibility, Decreased range of motion, Improper body mechanics  Visit Diagnosis: Low back pain, unspecified back pain laterality, unspecified chronicity, unspecified whether sciatica present  Other abnormalities of gait and mobility  Chronic midline low back pain with bilateral sciatica  Difficulty in walking, not elsewhere classified     Problem List Patient Active Problem List   Diagnosis Date Noted   SBO (small bowel obstruction) ([place]) 09/24/2017   Ihor Austin, LPTA/CLT; CBIS [phone]  Aldona Lento 07/04/2021, [place], [place], [address] Phone: [phone]   Fax:  [phone]  Name: DMITRIY GAIR MRN: [id number] Date of  Birth: 02/04/1961

## 2021-07-07 ENCOUNTER — Other Ambulatory Visit: Payer: Self-pay

## 2021-07-07 ENCOUNTER — Encounter (HOSPITAL_COMMUNITY): Payer: Self-pay | Admitting: Physical Therapy

## 2021-07-07 ENCOUNTER — Ambulatory Visit (HOSPITAL_COMMUNITY): Payer: BC Managed Care – PPO | Admitting: Physical Therapy

## 2021-07-07 DIAGNOSIS — M545 Low back pain, unspecified: Secondary | ICD-10-CM | POA: Diagnosis not present

## 2021-07-07 DIAGNOSIS — R339 Retention of urine, unspecified: Secondary | ICD-10-CM | POA: Diagnosis not present

## 2021-07-07 DIAGNOSIS — R262 Difficulty in walking, not elsewhere classified: Secondary | ICD-10-CM | POA: Diagnosis not present

## 2021-07-07 DIAGNOSIS — G8929 Other chronic pain: Secondary | ICD-10-CM | POA: Diagnosis not present

## 2021-07-07 DIAGNOSIS — R2689 Other abnormalities of gait and mobility: Secondary | ICD-10-CM

## 2021-07-07 DIAGNOSIS — M5441 Lumbago with sciatica, right side: Secondary | ICD-10-CM | POA: Diagnosis not present

## 2021-07-07 DIAGNOSIS — N401 Enlarged prostate with lower urinary tract symptoms: Secondary | ICD-10-CM | POA: Diagnosis not present

## 2021-07-07 DIAGNOSIS — M5442 Lumbago with sciatica, left side: Secondary | ICD-10-CM | POA: Diagnosis not present

## 2021-07-07 NOTE — Therapy (Signed)
[place], [place], [address] Phone: [phone]   Fax:  [phone]  Physical Therapy Treatment  Patient Details  Name: [name] MRN: [id number] Date of Birth: [birth year] Referring Provider (PT): Rema Jasmine NP   Encounter Date: 07/07/2021   PT End of Session - 07/07/21 0909     Visit Number 12    Number of Visits 15    Date for PT Re-Evaluation 07/09/21    Authorization Type [facility], BCBS COMM PPO secondary    Progress Note Due on Visit 17    PT Start Time 0906    PT Stop Time 0945    PT Time Calculation (min) 39 min    Activity Tolerance Patient tolerated treatment well    Behavior During Therapy [facility] for tasks assessed/performed             History reviewed. No pertinent past medical history.  Past Surgical History:  Procedure Laterality Date   ANTERIOR CERVICAL DISCECTOMY  1999   CERVICAL FUSION  2010, 2012   [place]   right   intestinal obstruction  2010   OTHER SURGICAL HISTORY  1966   sternum surgery   ribs removed as child  1967   thornwaldt cyst removed     [place]  2012   ULNA OSTEOTOMY Right 10/09/2014   Procedure: ULNAR SHORTENING;  Surgeon: Daryll Brod, MD;  Location: [place];  Service: Orthopedics;  Laterality: Right;   WRIST ARTHROSCOPY Right 10/09/2014   Procedure: RIGHT WRIST ARTHROSCOPY WITH ULNA SHORTENING OSTEOTOMY;  Surgeon: Daryll Brod, MD;  Location: [place];  Service: Orthopedics;  Laterality: Right;    There were no vitals filed for this visit.   Subjective Assessment - 07/07/21 0909     Subjective "Exercises were alright". Back is "at its regular ache, about a 7".    Pertinent History 5 neck surgeries, 2 back surgeries, most recent lumbar fusion 4.26.22    Patient Stated Goals Be less in pain    Currently in Pain? Yes    Pain Score 7     Pain  Location Back    Pain Orientation Posterior;Lower    Pain Descriptors / Indicators Aching    Pain Type Chronic pain    Pain Onset More than a month ago    Pain Frequency Constant                               [facility] Adult PT Treatment/Exercise - 07/07/21 0001       Lumbar Exercises: Machines for Strengthening   Leg Press 4Pl 2x 10    Other Lumbar Machine Exercise machine walkout 5pl x 10      Lumbar Exercises: Standing   Functional Squats 20 reps    Functional Squats Limitations with yellow med ball hold x20    Other Standing Lumbar Exercises empty box lifts from floor x 10    Other Standing Lumbar Exercises paloff walkout x10 each with BTB, band sidestepping with BTB 4 RT      Lumbar Exercises: Supine   Bent Knee Raise 20 reps    Dead Bug 20 reps    Bridge 20 reps                      PT Short Term Goals - 06/26/21  0927       PT SHORT TERM GOAL #1   Title Patient will be independent with initial HEP and self-management strategies to improve functional outcomes    Baseline Reports compliance    Time 2    Period Weeks    Status On-going    Target Date 05/29/21               PT Long Term Goals - 06/11/21 1136       PT LONG TERM GOAL #1   Title Patient will improve FOTO score to predicted value to indicate improvement in functional outcomes    Baseline Current 31% function    Time 4    Period Weeks    Status On-going      PT LONG TERM GOAL #2   Title Patient will be independent with advanced HEP and self-management strategies to improve functional outcomes    Time 4    Period Weeks    Status On-going      PT LONG TERM GOAL #3   Title Patient will have equal to or > 4+/5 MMT throughout BLE to improve ability to perform functional mobility, stair ambulation and ADLs.    Baseline See MMT    Time 4    Period Weeks    Status Achieved      PT LONG TERM GOAL #4   Title Patient will improve lumbar AROM by 25% in all restricted  planes for improved ability to perform functional mobility tasks and ADLs.    Baseline See AROM    Time 4    Period Weeks    Status Partially Met                   Plan - 07/07/21 0934     Clinical Impression Statement Patient tolerated ther ex progressions well today with no increased complaint of pain. Increased resistance with leg press and increased reps with palloff walkouts. Added machine walkouts and squats with med ball hold for increased core strengthening. Patient showing good form with minimal cueing. Progressed HEP to blue band resistance. Will continue progressions as tolerated. Reassess next visit.    Personal Factors and Comorbidities Time since onset of injury/illness/exacerbation;Past/Current Experience;Comorbidity 2    Examination-Activity Limitations Locomotion Level;Transfers;Stand;Squat;Bend    Examination-Participation Restrictions Community Activity;Occupation;Yard Work    Stability/Clinical Decision Making Stable/Uncomplicated    Rehab Potential Fair    PT Frequency 2x / week    PT Duration 4 weeks    PT Treatment/Interventions ADLs/Self Care Home Management;Aquatic Therapy;Cryotherapy;Biofeedback;Gait training;DME Instruction;Patient/family education;Neuromuscular re-education;Compression bandaging;Scar mobilization;Visual/perceptual remediation/compensation;Passive range of motion;Spinal Manipulations;Dry needling;Joint Manipulations;Orthotic Fit/Training;Stair training;Energy conservation;Functional mobility training;Splinting;Electrical Stimulation;Iontophoresis 28m/ml Dexamethasone;Moist Heat;Traction;Therapeutic exercise;Balance training;Manual lymph drainage;Manual techniques;Vasopneumatic Device;Vestibular;Taping;Therapeutic activities;Fluidtherapy;Parrafin;Ultrasound;Contrast Bath    PT Next Visit Plan Reassess next visit, adjust POC as indicated.    PT Home Exercise Plan Eval: ab set, glute set, 7/6 SLR, bridge. LTR 7/18 active hamstring stretch, thomas  stretch, prone hip extension 8/1 hip abd/ ext with band    Consulted and Agree with Plan of Care Patient             Patient will benefit from skilled therapeutic intervention in order to improve the following deficits and impairments:  Abnormal gait, Hypomobility, Decreased scar mobility, Decreased activity tolerance, Decreased strength, Pain, Increased fascial restricitons, Decreased mobility, Difficulty walking, Impaired flexibility, Decreased range of motion, Improper body mechanics  Visit Diagnosis: Low back pain, unspecified back pain laterality, unspecified chronicity, unspecified whether sciatica present  Other abnormalities of  gait and mobility  Chronic midline low back pain with bilateral sciatica     Problem List Patient Active Problem List   Diagnosis Date Noted   SBO (small bowel obstruction) ([place]) 09/24/2017   9:46 AM, 07/07/21 Josue Hector PT DPT  Physical Therapist with [facility]  [phone] [place] [address] [place], [place], [address] Phone: [phone]   Fax:  [phone]  Name: [name] MRN: [id number] Date of Birth: March 20, 1961

## 2021-07-08 DIAGNOSIS — N401 Enlarged prostate with lower urinary tract symptoms: Secondary | ICD-10-CM | POA: Diagnosis not present

## 2021-07-08 DIAGNOSIS — R339 Retention of urine, unspecified: Secondary | ICD-10-CM | POA: Diagnosis not present

## 2021-07-09 ENCOUNTER — Other Ambulatory Visit: Payer: Self-pay

## 2021-07-09 ENCOUNTER — Encounter: Payer: Self-pay | Admitting: Gastroenterology

## 2021-07-09 ENCOUNTER — Ambulatory Visit (HOSPITAL_COMMUNITY): Payer: BC Managed Care – PPO | Admitting: Physical Therapy

## 2021-07-09 DIAGNOSIS — R2689 Other abnormalities of gait and mobility: Secondary | ICD-10-CM

## 2021-07-09 DIAGNOSIS — M545 Low back pain, unspecified: Secondary | ICD-10-CM

## 2021-07-09 DIAGNOSIS — G8929 Other chronic pain: Secondary | ICD-10-CM

## 2021-07-09 DIAGNOSIS — M5441 Lumbago with sciatica, right side: Secondary | ICD-10-CM | POA: Diagnosis not present

## 2021-07-09 DIAGNOSIS — M5442 Lumbago with sciatica, left side: Secondary | ICD-10-CM | POA: Diagnosis not present

## 2021-07-09 DIAGNOSIS — R262 Difficulty in walking, not elsewhere classified: Secondary | ICD-10-CM | POA: Diagnosis not present

## 2021-07-09 NOTE — Therapy (Addendum)
[place] [address] [place], [place], [address] Phone: [phone]   Fax:  [phone]  Physical Therapy Treatment  Patient Details  Name: [name] MRN: [id number] Date of Birth: Dec 01, 1960 Referring Provider (PT): Rema Jasmine NP  PHYSICAL THERAPY DISCHARGE SUMMARY  Visits from Start of Care: 13  Current functional level related to goals / functional outcomes: See below    Remaining deficits: See below    Education / Equipment: See assessment   Patient agrees to discharge. Patient goals were met. Patient is being discharged due to meeting the stated rehab goals.   Encounter Date: 07/09/2021   PT End of Session - 07/09/21 1136     Visit Number 13    Number of Visits 15    Date for PT Re-Evaluation 07/09/21    Authorization Type [employer], BCBS COMM PPO secondary    Progress Note Due on Visit 17    PT Start Time 1120    PT Stop Time 1158    PT Time Calculation (min) 38 min    Activity Tolerance Patient tolerated treatment well    Behavior During Therapy [facility] for tasks assessed/performed             No past medical history on file.  Past Surgical History:  Procedure Laterality Date   ANTERIOR CERVICAL DISCECTOMY  1999   CERVICAL FUSION  2010, 2012   [place]   right   intestinal obstruction  2010   OTHER SURGICAL HISTORY  1966   sternum surgery   ribs removed as child  1967   thornwaldt cyst removed     [place]  2012   ULNA OSTEOTOMY Right 10/09/2014   Procedure: ULNAR SHORTENING;  Surgeon: Daryll Brod, MD;  Location: [place];  Service: Orthopedics;  Laterality: Right;   WRIST ARTHROSCOPY Right 10/09/2014   Procedure: RIGHT WRIST ARTHROSCOPY WITH ULNA SHORTENING OSTEOTOMY;  Surgeon: Daryll Brod, MD;  Location: [place];  Service: Orthopedics;  Laterality: Right;    There were no vitals filed for  this visit.   Subjective Assessment - 07/09/21 1137     Subjective "Not my best day", pain about an 8. Feels he is some better, but still concerned about chronicity of issues, "pinching" in abdomen and neck problems. Notes functional improvements but no change in pain levels. Unable to get numeric level of improvement.    Pertinent History 5 neck surgeries, 2 back surgeries, most recent lumbar fusion 4.26.22    How long can you stand comfortably? 15 minutes    Patient Stated Goals Be less in pain    Currently in Pain? Yes    Pain Score 8     Pain Location Back    Pain Orientation Posterior;Lower    Pain Descriptors / Indicators Aching    Pain Type Chronic pain    Pain Onset More than a month ago    Pain Frequency Constant    Aggravating Factors  standing, walking, lifting    Pain Relieving Factors Nothing    Effect of Pain on Daily Activities Limits                [facility] PT Assessment - 07/09/21 0001       Assessment   Medical Diagnosis Lumbar fusion    Referring Provider (PT) Rema Jasmine NP    Onset Date/Surgical Date 03/18/21    Prior Therapy  Yes      Prior Function   Level of Independence Independent with basic ADLs      Cognition   Overall Cognitive Status Within Functional Limits for tasks assessed      Observation/Other Assessments   Focus on Therapeutic Outcomes (FOTO)  49% function   was 31%     AROM   Lumbar Flexion WFL    Lumbar Extension 75% limited    Lumbar - Right Side Bend WFL    Lumbar - Left Side Bend [facility]      Strength   Right Hip Flexion 5/5    Right Hip Extension 4+/5    Right Hip ABduction 5/5    Left Hip Flexion 5/5    Left Hip Extension 4+/5    Left Hip ABduction 4+/5    Right Knee Extension 5/5    Left Knee Extension 5/5                                     PT Short Term Goals - 07/09/21 1155       PT SHORT TERM GOAL #1   Title Patient will be independent with initial HEP and self-management  strategies to improve functional outcomes    Baseline Reports compliance    Time 2    Period Weeks    Status Achieved    Target Date 05/29/21               PT Long Term Goals - 07/09/21 1155       PT LONG TERM GOAL #1   Title Patient will improve FOTO score to predicted value to indicate improvement in functional outcomes    Baseline Current 49% function    Time 4    Period Weeks    Status Achieved      PT LONG TERM GOAL #2   Title Patient will be independent with advanced HEP and self-management strategies to improve functional outcomes    Baseline Reviewed with patient, answered all questions    Time 4    Period Weeks    Status Achieved      PT LONG TERM GOAL #3   Title Patient will have equal to or > 4+/5 MMT throughout BLE to improve ability to perform functional mobility, stair ambulation and ADLs.    Baseline See MMT    Time 4    Period Weeks    Status Achieved      PT LONG TERM GOAL #4   Title Patient will improve lumbar AROM by 25% in all restricted planes for improved ability to perform functional mobility tasks and ADLs.    Baseline See AROM    Time 4    Period Weeks    Status Partially Met                   Plan - 07/09/21 1736     Clinical Impression Statement Patient has made good progress toward therapy goals. Showing improved strength, activity tolerance and functional ability. Patient remains limited by decreased self-perception and ongoing chronic pain. Discussed progress to therapy goals and HEP. Issued updated HEP handout and answered all patient questions. Patient being DC today to HEP with all goals met/ partially met. Patient encouraged to follow up with therapy services with any further questions or concerns.    Personal Factors and Comorbidities Time since onset of injury/illness/exacerbation;Past/Current Experience;Comorbidity 2    Examination-Activity  Limitations Locomotion Level;Transfers;Stand;Squat;Bend     Examination-Participation Restrictions Community Activity;Occupation;Yard Work    Stability/Clinical Decision Making Stable/Uncomplicated    Rehab Potential Fair    PT Treatment/Interventions ADLs/Self Care Home Management;Aquatic Therapy;Cryotherapy;Biofeedback;Gait training;DME Instruction;Patient/family education;Neuromuscular re-education;Compression bandaging;Scar mobilization;Visual/perceptual remediation/compensation;Passive range of motion;Spinal Manipulations;Dry needling;Joint Manipulations;Orthotic Fit/Training;Stair training;Energy conservation;Functional mobility training;Splinting;Electrical Stimulation;Iontophoresis 27m/ml Dexamethasone;Moist Heat;Traction;Therapeutic exercise;Balance training;Manual lymph drainage;Manual techniques;Vasopneumatic Device;Vestibular;Taping;Therapeutic activities;Fluidtherapy;Parrafin;Ultrasound;Contrast Bath    PT Next Visit Plan DC to HEP    PT Home Exercise Plan Eval: ab set, glute set, 7/6 SLR, bridge. LTR 7/18 active hamstring stretch, thomas stretch, prone hip extension 8/1 hip abd/ ext with band    Consulted and Agree with Plan of Care Patient             Patient will benefit from skilled therapeutic intervention in order to improve the following deficits and impairments:  Abnormal gait, Hypomobility, Decreased scar mobility, Decreased activity tolerance, Decreased strength, Pain, Increased fascial restricitons, Decreased mobility, Difficulty walking, Impaired flexibility, Decreased range of motion, Improper body mechanics  Visit Diagnosis: Low back pain, unspecified back pain laterality, unspecified chronicity, unspecified whether sciatica present  Other abnormalities of gait and mobility  Chronic midline low back pain with bilateral sciatica     Problem List Patient Active Problem List   Diagnosis Date Noted   [address] [place]) [place] 09/24/2017   5:37 PM, 07/09/21 CJosue HectorPT DPT  [facility] [phone] [place] [address]Phone: [phone]  Fax:  [phone] Name: Douglas KUROWSKIMRN: [id number] of Birth: [birth year]

## 2021-07-09 NOTE — Patient Instructions (Signed)
Access Code: [email or web address] URL: [email or web address] Date: 07/09/2021 Prepared by: Josue Hector  Exercises Dead Bug - 1 x daily - 3 x weekly - 2 sets - 10 reps Anti-Rotation Sidestepping with Resistance - 1 x daily - 3 x weekly - 1 sets - 10 reps Standing Anti-Rotation Press with Anchored Resistance - 1 x daily - 3 x weekly - 2 sets - 10 reps Side Stepping with Resistance at Ankles - 1 x daily - 3 x weekly - 1 sets - 10 reps Squat with Chest Press - 1 x daily - 3 x weekly - 2 sets - 10 reps

## 2021-07-15 ENCOUNTER — Telehealth: Payer: Self-pay

## 2021-07-15 NOTE — Telephone Encounter (Signed)
[name],  Please review all of these patient's anesthesia records and provide information regarding [facility] clearance; Thank you

## 2021-07-15 NOTE — Telephone Encounter (Signed)
Please see previous note. I did NOT cancel this patient's PV appt as I am hoping he can be added to Dr. Lynne Leader upcoming schedule.  If the procedure appt is too far out please let PV know so we can "move the paperwork" to the appropriate PV date.  Thanks

## 2021-07-16 ENCOUNTER — Other Ambulatory Visit: Payer: Self-pay

## 2021-07-16 DIAGNOSIS — Z86010 Personal history of colonic polyps: Secondary | ICD-10-CM

## 2021-07-16 NOTE — Telephone Encounter (Signed)
Patient has been scheduled for 10/4 at 9:30.  He will need to arrive at 8:00 for [facility]

## 2021-07-18 ENCOUNTER — Ambulatory Visit: Payer: BC Managed Care – PPO | Admitting: Gastroenterology

## 2021-07-22 ENCOUNTER — Telehealth: Payer: Self-pay | Admitting: Gastroenterology

## 2021-07-22 DIAGNOSIS — R339 Retention of urine, unspecified: Secondary | ICD-10-CM | POA: Diagnosis not present

## 2021-07-22 NOTE — Telephone Encounter (Signed)
Patient notified of his [facility] appointment on 10/4 and reminded of phone PV 9/6.

## 2021-07-22 NOTE — Telephone Encounter (Signed)
Pt [facility] to [facility] because he is scheduled for surgery on 08/13/21. He did not remember that when he scheduled colon.

## 2021-07-23 NOTE — Telephone Encounter (Signed)
Left message for patient to call back  

## 2021-07-24 NOTE — Telephone Encounter (Signed)
[name] is agreeable to 09/22/21 at 7:30.  He understands he will arrive at 6:00 am at [facility].  He will keep his pre-visit as scheduled for next week

## 2021-07-29 ENCOUNTER — Ambulatory Visit (AMBULATORY_SURGERY_CENTER): Payer: BC Managed Care – PPO | Admitting: *Deleted

## 2021-07-29 ENCOUNTER — Other Ambulatory Visit: Payer: Self-pay

## 2021-07-29 VITALS — Ht 69.0 in | Wt 205.0 lb

## 2021-07-29 DIAGNOSIS — Z860100 Personal history of colon polyps, unspecified: Secondary | ICD-10-CM

## 2021-07-29 MED ORDER — MOVIPREP 100 G PO SOLR: 1.0000 | Freq: Once | ORAL | 0 refills | Status: AC

## 2021-07-29 NOTE — Progress Notes (Signed)
Patient's pre-visit was done today over the phone with the patient due to COVID-19 pandemic. Name,DOB and address verified. Insurance verified. Patient denies any allergies to Eggs and Soy. Patient denies any problems with anesthesia/sedation. Patient denies taking diet pills or blood thinners. No home Oxygen. Packet of Prep instructions mailed to patient including a copy of a consent form & moviprep coupon-pt is aware. Patient understands to call us back with any questions or concerns. Patient is aware of our care-partner policy and 0000000 safety protocol.   EMMI education assigned to the patient for the procedure, sent to [place].   The patient is COVID-19 vaccinated.

## 2021-07-31 DIAGNOSIS — J309 Allergic rhinitis, unspecified: Secondary | ICD-10-CM | POA: Diagnosis not present

## 2021-07-31 DIAGNOSIS — D485 Neoplasm of uncertain behavior of skin: Secondary | ICD-10-CM | POA: Diagnosis not present

## 2021-07-31 DIAGNOSIS — G894 Chronic pain syndrome: Secondary | ICD-10-CM | POA: Diagnosis not present

## 2021-07-31 DIAGNOSIS — Z6830 Body mass index (BMI) 30.0-30.9, adult: Secondary | ICD-10-CM | POA: Diagnosis not present

## 2021-07-31 DIAGNOSIS — E6609 Other obesity due to excess calories: Secondary | ICD-10-CM | POA: Diagnosis not present

## 2021-07-31 DIAGNOSIS — Z0289 Encounter for other administrative examinations: Secondary | ICD-10-CM

## 2021-08-01 ENCOUNTER — Encounter: Payer: BC Managed Care – PPO | Admitting: Gastroenterology

## 2021-08-11 DIAGNOSIS — G3184 Mild cognitive impairment, so stated: Secondary | ICD-10-CM | POA: Diagnosis not present

## 2021-08-11 DIAGNOSIS — R410 Disorientation, unspecified: Secondary | ICD-10-CM | POA: Diagnosis not present

## 2021-08-11 DIAGNOSIS — G4733 Obstructive sleep apnea (adult) (pediatric): Secondary | ICD-10-CM | POA: Diagnosis not present

## 2021-08-11 DIAGNOSIS — E538 Deficiency of other specified B group vitamins: Secondary | ICD-10-CM | POA: Diagnosis not present

## 2021-08-12 ENCOUNTER — Encounter (INDEPENDENT_AMBULATORY_CARE_PROVIDER_SITE_OTHER): Payer: Self-pay | Admitting: Family Medicine

## 2021-08-12 ENCOUNTER — Ambulatory Visit (INDEPENDENT_AMBULATORY_CARE_PROVIDER_SITE_OTHER): Payer: BC Managed Care – PPO | Admitting: Family Medicine

## 2021-08-12 ENCOUNTER — Other Ambulatory Visit: Payer: Self-pay

## 2021-08-12 VITALS — BP 125/80 | HR 66 | Temp 97.9°F | Ht 69.0 in | Wt 208.0 lb

## 2021-08-12 DIAGNOSIS — M549 Dorsalgia, unspecified: Secondary | ICD-10-CM | POA: Diagnosis not present

## 2021-08-12 DIAGNOSIS — J302 Other seasonal allergic rhinitis: Secondary | ICD-10-CM | POA: Diagnosis not present

## 2021-08-12 DIAGNOSIS — R5383 Other fatigue: Secondary | ICD-10-CM

## 2021-08-12 DIAGNOSIS — Z6830 Body mass index (BMI) 30.0-30.9, adult: Secondary | ICD-10-CM

## 2021-08-12 DIAGNOSIS — M6281 Muscle weakness (generalized): Secondary | ICD-10-CM | POA: Diagnosis not present

## 2021-08-12 DIAGNOSIS — E538 Deficiency of other specified B group vitamins: Secondary | ICD-10-CM | POA: Diagnosis not present

## 2021-08-12 DIAGNOSIS — G8929 Other chronic pain: Secondary | ICD-10-CM

## 2021-08-12 DIAGNOSIS — Z9189 Other specified personal risk factors, not elsewhere classified: Secondary | ICD-10-CM

## 2021-08-12 DIAGNOSIS — Z1331 Encounter for screening for depression: Secondary | ICD-10-CM

## 2021-08-12 DIAGNOSIS — N9989 Other postprocedural complications and disorders of genitourinary system: Secondary | ICD-10-CM

## 2021-08-12 DIAGNOSIS — R0602 Shortness of breath: Secondary | ICD-10-CM | POA: Diagnosis not present

## 2021-08-12 DIAGNOSIS — E669 Obesity, unspecified: Secondary | ICD-10-CM

## 2021-08-12 NOTE — Progress Notes (Signed)
Chief Complaint:   OBESITY [name] (MR# [id number]) is a 60 y.o. male who presents for evaluation and treatment of obesity and related comorbidities. Current BMI is Body mass index is 30.72 kg/m. [name] has been struggling with his weight for many years and has been unsuccessful in either losing weight, maintaining weight loss, or reaching his healthy weight goal.  I see [name]'s wife [name], age 28 years old. She is on Category 3 and he has been following her plan. One year ago [name] weighed 220 lbs and he states that he can't eat a lot and he wants to know what to eat. He notes he has a big belly and he knows that can't be healthy. He is concerned that he doesn't have a lot of arm or leg muscle strength.  [name] is currently in the action stage of change and ready to dedicate time achieving and maintaining a healthier weight. [name] is interested in becoming our patient and working on intensive lifestyle modifications including (but not limited to) diet and exercise for weight loss.  [name]'s habits were reviewed today and are as follows: His family eats meals together, he thinks his family will eat healthier with him, he started gaining weight in 2015, his heaviest weight ever was 210-220 pounds, he skips meals frequently, and he is frequently drinking liquids with calories.  Depression Screen [name]'s Food and Mood (modified PHQ-9) score was 4.  Depression screen PHQ 2/9 08/12/2021  Decreased Interest 1  Down, Depressed, Hopeless 1  PHQ - 2 Score 2  Altered sleeping 0  Tired, decreased energy 0  Change in appetite 0  Feeling bad or failure about yourself  0  Trouble concentrating 2  Moving slowly or fidgety/restless 0  Suicidal thoughts 0  PHQ-9 Score 4  Difficult doing work/chores Extremely dIfficult   Subjective:   1. Other fatigue [name] admits to daytime somnolence and denies waking up still tired. Patent has a history of symptoms of daytime fatigue. [name] generally gets 6 hours  of sleep per night, and states that he has nightime awakenings and generally restful sleep. Snoring is present. Apneic episodes are not present. Epworth Sleepiness Score is 11. [name] had a sleep apnea test 2 weeks ago that showed no need for CPAP etc. He doesn't sleep very well because of chronic back and neck pain.  2. Shortness of breath on exertion [name] notes increasing shortness of breath with exercising and seems to be worsening over time with weight gain. He notes getting out of breath sooner with activity than he used to. This has not gotten worse recently. [name] denies shortness of breath at rest or orthopnea.  3. Other chronic back pain [name] from C2-C6 with fusion done multiple time, and the last one was done 2020. He also notes lower back pain. He taking Oxycodone as needed and Zanaflex.  4. Seasonal allergies [name] has seasonal allergies that have been more bothersome with the change of weather/season. He is taking Zyrtec q daily.   5. Muscle weakness (generalized)  6. Postprocedural disorder of urinary system [name] has been using self catheters to urinate daily ever since a C-spine surgery.  7. Vitamin B12 deficiency [name] takes B-complex vitamins. He is not a vegetarian.  He does not have a previous diagnosis of pernicious anemia.  He does not have a history of weight loss surgery.   8. At risk for impaired metabolic function [name] is at increased risk for impaired metabolic function due  to obesity.  Assessment/Plan:   Orders Placed This Encounter  Procedures   Vitamin B12   CBC with Differential/Platelet   Comprehensive metabolic panel   Folate   Hemoglobin A1c   Insulin, random   Lipid Panel With LDL/HDL Ratio   T3   T4, free   TSH   VITAMIN D 25 Hydroxy (Vit-D Deficiency, Fractures)   EKG 12-Lead    Medications Discontinued During This Encounter  Medication Reason   cetirizine (ZYRTEC) 10 MG tablet Error   Ascorbic Acid (VITAMIN C) 1000 MG tablet Error      No orders of the defined types were placed in this encounter.    1. Other fatigue [name] does feel that his weight is causing his energy to be lower than it should be. Fatigue may be related to obesity, depression or many other causes. Labs will be ordered, and in the meanwhile, [name] will focus on self care including making healthy food choices, increasing physical activity and focusing on stress reduction.  - EKG 12-Lead - Hemoglobin A1c - Insulin, random - Lipid Panel With LDL/HDL Ratio  2. Shortness of breath on exertion [name] does feel that he gets out of breath more easily that he used to when he exercises. [name] shortness of breath appears to be obesity related and exercise induced. He has agreed to work on weight loss and gradually increase exercise to treat his exercise induced shortness of breath. Will continue to monitor closely.  3. Other chronic back pain  4. Seasonal allergies [name] will continue Zyrtec, and will continue to follow up as directed.  - Seasonal and environmental allergies and pathophysiology of disease process discussed with patient.  - Preventative strategies as first line for management discussed.  I encouraged use of [name] or N-95 mask use as well as sterile saline rinses such as [name] or [name] sinus rinses to be done twice daily and after any prolonged exposure to the environment or allergen.    It is best to use distilled water or previously boiled water    - If eyes are itchy or irritated feeling when your seasonal allergies get bad, ok to use [name] over-the-counter eyedrops as needed - If continues to worsen despite preventative strategies, take over-the-counter meds such as [name], [name] etc daily during allergy seasons or nasal steroids such as [name], [name] and the like - We can consider [name] nasal spray, if symptoms are not well controlled with OTC medications, nasal rinses and preventative strategies. - Encouraged to return to clinic or call  the office today discussed further questions or concerns they may have.  5. Muscle weakness (generalized)  - Vitamin B12 - CBC with Differential/Platelet - Comprehensive metabolic panel - Folate - T3 - T4, free - TSH - VITAMIN D 25 Hydroxy (Vit-D Deficiency, Fractures)  6. Postprocedural disorder of urinary system   7. Vitamin B12 deficiency The diagnosis was reviewed with the patient. Counseling provided today, see below. We will check labs today, and will continue to monitor. Orders and follow up as documented in patient record.  Counseling The body needs vitamin B12: to make red blood cells; to make DNA; and to help the nerves work properly so they can carry messages from the brain to the body.  The main causes of vitamin B12 deficiency include dietary deficiency, digestive diseases, pernicious anemia, and having a surgery in which part of the stomach or small intestine is removed.  Certain medicines can make it harder for the body to absorb vitamin  B12. These medicines include: heartburn medications; some antibiotics; some medications used to treat diabetes, gout, and high cholesterol.  In some cases, there are no symptoms of this condition. If the condition leads to anemia or nerve damage, various symptoms can occur, such as weakness or fatigue, shortness of breath, and numbness or tingling in your hands and feet.   Treatment:  May include taking vitamin B12 supplements.  Avoid alcohol.  Eat lots of healthy foods that contain vitamin B12: Beef, pork, chicken, [place], and organ meats, such as liver.  Seafood: This includes clams, rainbow trout, salmon, tuna, and haddock. Eggs.  Cereal and dairy products that are fortified: This means that vitamin B12 has been added to the food.   - Vitamin B12  8. Screening for depression [name] had a negative depression screening. Depression is commonly associated with obesity and often results in emotional eating behaviors. We will monitor this  closely and work on CBT to help improve the non-hunger eating patterns. Referral to Psychology may be required if no improvement is seen as he continues in our clinic.  9. At risk for impaired metabolic function [name] was given approximately 23 minutes of impaired  metabolic function prevention counseling today. We discussed intensive lifestyle modifications today with an emphasis on specific nutrition and exercise instructions and strategies.   Repetitive spaced learning was employed today to elicit superior memory formation and behavioral change.  10. Obesity with current BMI of 30.8 [name] is currently in the action stage of change and his goal is to continue with weight loss efforts. I recommend [name] begin the structured treatment plan as follows:  He has agreed to the Category 2 Plan.  Exercise goals: As is.   Behavioral modification strategies: increasing lean protein intake, decreasing simple carbohydrates, and planning for success.  He was informed of the importance of frequent follow-up visits to maximize his success with intensive lifestyle modifications for his multiple health conditions. He was informed we would discuss his lab results at his next visit unless there is a critical issue that needs to be addressed sooner. [name] agreed to keep his next visit at the agreed upon time to discuss these results.  Objective:   Blood pressure 125/80, pulse 66, temperature 97.9 F (36.6 C), height 5\' 9"  (1.753 m), weight 208 lb (94.3 kg), SpO2 97 %. Body mass index is 30.72 kg/m.  EKG: Normal sinus rhythm, rate 71 BPM.  Indirect Calorimeter completed today shows a VO2 of 241 and a REE of 1656.  His calculated basal metabolic rate is 2025 thus his basal metabolic rate is worse than expected.  General: Cooperative, alert, well developed, in no acute distress. HEENT: Conjunctivae and lids unremarkable. Cardiovascular: Regular rhythm.  Lungs: Normal work of breathing. Neurologic: No focal  deficits.   Lab Results  Component Value Date   CREATININE 0.92 09/25/2017   BUN 12 09/25/2017   NA 140 09/25/2017   K 3.7 09/25/2017   CL 106 09/25/2017   CO2 23 09/25/2017   Lab Results  Component Value Date   ALT 22 09/23/2017   AST 23 09/23/2017   ALKPHOS 92 09/23/2017   BILITOT 0.5 09/23/2017   No results found for: HGBA1C No results found for: INSULIN No results found for: TSH No results found for: CHOL, HDL, LDLCALC, LDLDIRECT, TRIG, CHOLHDL Lab Results  Component Value Date   WBC 12.7 (H) 09/24/2017   HGB 15.7 09/24/2017   HCT 46.3 09/24/2017   MCV 89.0 09/24/2017   PLT 209 09/24/2017  No results found for: IRON, TIBC, FERRITIN  Attestation Statements:   Reviewed by clinician on day of visit: allergies, medications, problem list, medical history, surgical history, family history, social history, and previous encounter notes.   [name], am acting as transcriptionist for [employer], DO.  I have reviewed the above documentation for accuracy and completeness, and I agree with the above. [name], D.O.  The [place] was signed into law in 2016 which includes the topic of electronic health records.  This provides immediate access to information in MyChart.  This includes consultation notes, operative notes, office notes, lab results and pathology reports.  If you have any questions about what you read please let us know at your next visit so we can discuss your concerns and take corrective action if need be.  We are right here with you.

## 2021-08-13 DIAGNOSIS — N138 Other obstructive and reflux uropathy: Secondary | ICD-10-CM | POA: Diagnosis not present

## 2021-08-13 DIAGNOSIS — R338 Other retention of urine: Secondary | ICD-10-CM | POA: Diagnosis not present

## 2021-08-13 DIAGNOSIS — Z79899 Other long term (current) drug therapy: Secondary | ICD-10-CM | POA: Diagnosis not present

## 2021-08-13 DIAGNOSIS — N401 Enlarged prostate with lower urinary tract symptoms: Secondary | ICD-10-CM | POA: Diagnosis not present

## 2021-08-13 DIAGNOSIS — Z9109 Other allergy status, other than to drugs and biological substances: Secondary | ICD-10-CM | POA: Diagnosis not present

## 2021-08-13 DIAGNOSIS — Z885 Allergy status to narcotic agent status: Secondary | ICD-10-CM | POA: Diagnosis not present

## 2021-08-13 LAB — CBC WITH DIFFERENTIAL/PLATELET
Basophils Absolute: 0.1 10*3/uL (ref 0.0–0.2)
Basos: 1 %
EOS (ABSOLUTE): 0.1 10*3/uL (ref 0.0–0.4)
Eos: 2 %
Hematocrit: 46.2 % (ref 37.5–51.0)
Hemoglobin: 15.4 g/dL (ref 13.0–17.7)
Immature Grans (Abs): 0 10*3/uL (ref 0.0–0.1)
Immature Granulocytes: 0 %
Lymphocytes Absolute: 2.3 10*3/uL (ref 0.7–3.1)
Lymphs: 33 %
MCH: 29.8 pg (ref 26.6–33.0)
MCHC: 33.3 g/dL (ref 31.5–35.7)
MCV: 89 fL (ref 79–97)
Monocytes Absolute: 0.9 10*3/uL (ref 0.1–0.9)
Monocytes: 13 %
Neutrophils Absolute: 3.6 10*3/uL (ref 1.4–7.0)
Neutrophils: 51 %
Platelets: 225 10*3/uL (ref 150–450)
RBC: 5.17 x10E6/uL (ref 4.14–5.80)
RDW: 13.3 % (ref 11.6–15.4)
WBC: 7 10*3/uL (ref 3.4–10.8)

## 2021-08-13 LAB — COMPREHENSIVE METABOLIC PANEL
ALT: 18 IU/L (ref 0–44)
AST: 17 IU/L (ref 0–40)
Albumin/Globulin Ratio: 1.9 (ref 1.2–2.2)
Albumin: 4.6 g/dL (ref 3.8–4.9)
Alkaline Phosphatase: 100 IU/L (ref 44–121)
BUN/Creatinine Ratio: 12 (ref 10–24)
BUN: 12 mg/dL (ref 8–27)
Bilirubin Total: 0.6 mg/dL (ref 0.0–1.2)
CO2: 22 mmol/L (ref 20–29)
Calcium: 9.2 mg/dL (ref 8.6–10.2)
Chloride: 98 mmol/L (ref 96–106)
Creatinine, Ser: 1.04 mg/dL (ref 0.76–1.27)
Globulin, Total: 2.4 g/dL (ref 1.5–4.5)
Glucose: 82 mg/dL (ref 65–99)
Potassium: 4 mmol/L (ref 3.5–5.2)
Sodium: 137 mmol/L (ref 134–144)
Total Protein: 7 g/dL (ref 6.0–8.5)
eGFR: 82 mL/min/{1.73_m2} (ref >59)

## 2021-08-13 LAB — T3: T3, Total: 113 ng/dL (ref 71–180)

## 2021-08-13 LAB — LIPID PANEL WITH LDL/HDL RATIO
Cholesterol, Total: 230 mg/dL — ABNORMAL HIGH (ref 100–199)
HDL: 32 mg/dL — ABNORMAL LOW (ref >39)
LDL Chol Calc (NIH): 173 mg/dL — ABNORMAL HIGH (ref 0–99)
LDL/HDL Ratio: 5.4 ratio — ABNORMAL HIGH (ref 0.0–3.6)
Triglycerides: 135 mg/dL (ref 0–149)
VLDL Cholesterol Cal: 25 mg/dL (ref 5–40)

## 2021-08-13 LAB — T4, FREE: Free T4: 1.18 ng/dL (ref 0.82–1.77)

## 2021-08-13 LAB — HEMOGLOBIN A1C
Est. average glucose Bld gHb Est-mCnc: 123 mg/dL
Hgb A1c MFr Bld: 5.9 % — ABNORMAL HIGH (ref 4.8–5.6)

## 2021-08-13 LAB — VITAMIN D 25 HYDROXY (VIT D DEFICIENCY, FRACTURES): Vit D, 25-Hydroxy: 41.6 ng/mL (ref 30.0–100.0)

## 2021-08-13 LAB — VITAMIN B12: Vitamin B-12: 396 pg/mL (ref 232–1245)

## 2021-08-13 LAB — INSULIN, RANDOM: INSULIN: 12 u[IU]/mL (ref 2.6–24.9)

## 2021-08-13 LAB — FOLATE: Folate: 9.7 ng/mL (ref >3.0)

## 2021-08-13 LAB — TSH: TSH: 1.51 u[IU]/mL (ref 0.450–4.500)

## 2021-08-21 DIAGNOSIS — R339 Retention of urine, unspecified: Secondary | ICD-10-CM | POA: Diagnosis not present

## 2021-08-26 ENCOUNTER — Ambulatory Visit (INDEPENDENT_AMBULATORY_CARE_PROVIDER_SITE_OTHER): Payer: BC Managed Care – PPO | Admitting: Family Medicine

## 2021-08-26 ENCOUNTER — Other Ambulatory Visit: Payer: Self-pay

## 2021-08-26 ENCOUNTER — Encounter (INDEPENDENT_AMBULATORY_CARE_PROVIDER_SITE_OTHER): Payer: Self-pay | Admitting: Family Medicine

## 2021-08-26 VITALS — BP 128/81 | HR 69 | Ht 69.0 in | Wt 204.0 lb

## 2021-08-26 DIAGNOSIS — Z9189 Other specified personal risk factors, not elsewhere classified: Secondary | ICD-10-CM

## 2021-08-26 DIAGNOSIS — E7849 Other hyperlipidemia: Secondary | ICD-10-CM

## 2021-08-26 DIAGNOSIS — R7303 Prediabetes: Secondary | ICD-10-CM

## 2021-08-26 DIAGNOSIS — K219 Gastro-esophageal reflux disease without esophagitis: Secondary | ICD-10-CM

## 2021-08-26 DIAGNOSIS — E559 Vitamin D deficiency, unspecified: Secondary | ICD-10-CM | POA: Diagnosis not present

## 2021-08-26 DIAGNOSIS — Z6830 Body mass index (BMI) 30.0-30.9, adult: Secondary | ICD-10-CM

## 2021-08-26 DIAGNOSIS — E669 Obesity, unspecified: Secondary | ICD-10-CM

## 2021-08-26 MED ORDER — VITAMIN D (ERGOCALCIFEROL) 1.25 MG (50000 UNIT) PO CAPS: 50000.0000 [IU] | ORAL_CAPSULE | ORAL | 0 refills | Status: DC

## 2021-08-26 NOTE — Patient Instructions (Signed)
The 10-year ASCVD risk score (Arnett DK, et al., 2019) is: 13.4%   Values used to calculate the score:     Age: 60 years     Sex: Male     Is Non-Hispanic African American: No     Diabetic: No     Tobacco smoker: No     Systolic Blood Pressure: 618 mmHg     Is BP treated: No     HDL Cholesterol: 32 mg/dL     Total Cholesterol: 230 mg/dL

## 2021-08-27 ENCOUNTER — Telehealth: Payer: Self-pay | Admitting: Gastroenterology

## 2021-08-27 NOTE — H&P (View-Only) (Signed)
Chief Complaint:   OBESITY [name] is here to discuss his progress with his obesity treatment plan along with follow-up of his obesity related diagnoses. Douglas is on the Category 2 Plan and states he is following his eating plan approximately 33% of the time. Donavan states he is not currently exercising.  Today's visit was #: 2 Starting weight: 208 lbs Starting date: 08/12/2021 Today's weight: 204 lbs Today's date: 08/26/2021 Total lbs lost to date: 4 Total lbs lost since last in-office visit: 4  Interim History: Kingson Lohmeyer [name] is here today for his first follow-up office visit since starting the program with [place].  All blood work/ lab tests that were recently ordered by myself or an outside provider were reviewed with patient today per their request.   Extended time was spent counseling him on all new disease processes that were discovered or preexisting ones that are affected by BMI.  he understands that many of these abnormalities will need to monitored regularly along with the current treatment plan of prudent dietary changes, in which we are making each and every office visit, to improve these health parameters. [name] is here for a follow up office visit.  We reviewed his meal plan and questions were answered.  Patient's food recall appears to be accurate and consistent with what is on plan when he is following it.   When eating on plan, his hunger and cravings are well controlled.   [employer] refrigerator went out so it was difficult for him to eat on plan. He had bladder surgery and had an altered diet for a couple of days. Pt is drinking 64 oz of water per day. He is going to the beach for a week October 17-21.  Subjective:   1. Other hyperlipidemia New. Discussed labs with patient today. Upon questioning pt, he states his PCP recently mentioned  he has cholesterol issues and was told to take a medicine, but pt hasn't started it yet.   The 10-year ASCVD risk score (Arnett DK, et al., 2019)  is: 13.4%   Values used to calculate the score:     Age: 60 years     Sex: Male     Is Non-Hispanic African American: No     Diabetic: No     Tobacco smoker: No     Systolic Blood Pressure: 174 mmHg     Is BP treated: No     HDL Cholesterol: 32 mg/dL     Total Cholesterol: 230 mg/dL  2. Vitamin D deficiency New. Discussed labs with patient today. No history of Vit D deficiency.   3. Gastroesophageal reflux disease, unspecified whether esophagitis present Symptoms are stable on meal plan. Pt has no issues or concerns today. She is on Prilosec QD, as directed by his PCP.  4. Pre-diabetes New. Discussed labs with patient today. [name] loves his M&Ms and tortilla chips.  5. At risk for diabetes mellitus [name] is at higher than average risk for developing diabetes due to obesity.   Assessment/Plan:  No orders of the defined types were placed in this encounter.   There are no discontinued medications.   Meds ordered this encounter  Medications   Vitamin D, Ergocalciferol, (DRISDOL) 1.25 MG (50000 UNIT) CAPS capsule    Sig: Take 1 capsule (50,000 Units total) by mouth every 7 (seven) days.    Dispense:  4 capsule    Refill:  0     1. Other hyperlipidemia I recommend pt start statin  medication. He will contact PCP to start it. Also decrease saturated and trans fats by following prudent nutritional plan. We will monitor alongside PCP. Education done extensively on low diet and lifestyle can affect these levels. Cardiovascular risk and specific lipid/LDL goals reviewed.  We discussed several lifestyle modifications today and [name] will continue to work on diet, exercise and weight loss efforts. Orders and follow up as documented in patient record.   Counseling Intensive lifestyle modifications are the first line treatment for this issue. Dietary changes: Increase soluble fiber. Decrease simple carbohydrates. Exercise changes: Moderate to vigorous-intensity aerobic activity 150 minutes  per week if tolerated. Lipid-lowering medications: see documented in medical record.  2. Vitamin D deficiency Plan: - Discussed importance of vitamin D to their health and well-being.  - possible symptoms of low Vitamin D can be low energy, depressed mood, muscle aches, joint aches, osteoporosis etc. - low Vitamin D levels may be linked to an increased risk of cardiovascular events and even increased risk of cancers- such as colon and breast.  - I recommend pt take a 50,000 IU weekly prescription vit D - see script below   - Informed patient this may be a lifelong thing, and he was encouraged to continue to take the medicine until told otherwise.   - we will need to monitor levels regularly (every 3-4 mo on average) to keep levels within normal limits.  - weight loss will likely improve availability of vitamin D, thus encouraged [name] to continue with meal plan and their weight loss efforts to further improve this condition - pt's questions and concerns regarding this condition addressed.  Start- Vitamin D, Ergocalciferol, (DRISDOL) 1.25 MG (50000 UNIT) CAPS capsule; Take 1 capsule (50,000 Units total) by mouth every 7 (seven) days.  Dispense: 4 capsule; Refill: 0  3. Gastroesophageal reflux disease, unspecified whether esophagitis present Intensive lifestyle modifications are the first line treatment for this issue. We discussed several lifestyle modifications today and he will continue to work on diet, exercise and weight loss efforts. Orders and follow up as documented in patient record. Continue prudent nutritional plan, weight loss, and avoid triggers.  Counseling If a person has gastroesophageal reflux disease (GERD), food and stomach acid move back up into the esophagus and cause symptoms or problems such as damage to the esophagus. Anti-reflux measures include: raising the head of the bed, avoiding tight clothing or belts, avoiding eating late at night, not lying down shortly after  mealtime, and achieving weight loss. Avoid ASA, NSAID's, caffeine, alcohol, and tobacco.  OTC Pepcid and/or Tums are often very helpful for as needed use.  However, for persisting chronic or daily symptoms, stronger medications like Omeprazole may be needed. You may need to avoid foods and drinks such as: Coffee and tea (with or without caffeine). Drinks that contain alcohol. Energy drinks and sports drinks. Bubbly (carbonated) drinks or sodas. Chocolate and cocoa. Peppermint and mint flavorings. Garlic and onions. Horseradish. Spicy and acidic foods. These include peppers, chili powder, curry powder, vinegar, hot sauces, and BBQ sauce. Citrus fruit juices and citrus fruits, such as oranges, lemons, and limes. Tomato-based foods. These include red sauce, chili, salsa, and pizza with red sauce. Fried and fatty foods. These include donuts, french fries, potato chips, and high-fat dressings. High-fat meats. These include hot dogs, rib eye steak, sausage, ham, and bacon.  4. Pre-diabetes Jakoby will continue to work on increasing proteins, following meal plan, weight loss, exercise, and decreasing simple carbohydrates to help decrease the risk of diabetes.  Recheck in 3 months.  5. At risk for diabetes mellitus Saunders was given approximately 23 minutes of diabetes education and counseling today. We discussed intensive lifestyle modifications today with an emphasis on weight loss as well as increasing exercise and decreasing simple carbohydrates in his diet. We also reviewed medication options with an emphasis on risk versus benefit of those discussed.   Repetitive spaced learning was employed today to elicit superior memory formation and behavioral change.  6. Obesity with current BMI of 30.2  Idan is currently in the action stage of change. As such, his goal is to continue with weight loss efforts. He has agreed to the Category 2 Plan.   Eating Out and travel eating strategies discussed with  pt.  Exercise goals:  As is  Behavioral modification strategies: increasing lean protein intake, decreasing simple carbohydrates, travel eating strategies, and planning for success.  Manjinder has agreed to follow-up with our clinic in 3 weeks (pt is going to the beach for 1 week). He was informed of the importance of frequent follow-up visits to maximize his success with intensive lifestyle modifications for his multiple health conditions.   Objective:   Blood pressure 128/81, pulse 69, height 5\' 9"  (1.753 m), weight 204 lb (92.5 kg), SpO2 98 %. Body mass index is 30.13 kg/m.  General: Cooperative, alert, well developed, in no acute distress. HEENT: Conjunctivae and lids unremarkable. Cardiovascular: Regular rhythm.  Lungs: Normal work of breathing. Neurologic: No focal deficits.   Lab Results  Component Value Date   CREATININE 1.04 08/12/2021   BUN 12 08/12/2021   NA 137 08/12/2021   K 4.0 08/12/2021   CL 98 08/12/2021   CO2 22 08/12/2021   Lab Results  Component Value Date   ALT 18 08/12/2021   AST 17 08/12/2021   ALKPHOS 100 08/12/2021   BILITOT 0.6 08/12/2021   Lab Results  Component Value Date   HGBA1C 5.9 (H) 08/12/2021   Lab Results  Component Value Date   INSULIN 12.0 08/12/2021   Lab Results  Component Value Date   TSH 1.510 08/12/2021   Lab Results  Component Value Date   CHOL 230 (H) 08/12/2021   HDL 32 (L) 08/12/2021   LDLCALC 173 (H) 08/12/2021   TRIG 135 08/12/2021   Lab Results  Component Value Date   VD25OH 41.6 08/12/2021   Lab Results  Component Value Date   WBC 7.0 08/12/2021   HGB 15.4 08/12/2021   HCT 46.2 08/12/2021   MCV 89 08/12/2021   PLT 225 08/12/2021    Attestation Statements:   Reviewed by clinician on day of visit: allergies, medications, problem list, medical history, surgical history, family history, social history, and previous encounter notes.  Coral Ceo, CMA, am acting as transcriptionist for [employer], DO.  I have reviewed the above documentation for accuracy and completeness, and I agree with the above. Marjory Sneddon, D.O.  The [place] was signed into law in 2016 which includes the topic of electronic health records.  This provides immediate access to information in MyChart.  This includes consultation notes, operative notes, office notes, lab results and pathology reports.  If you have any questions about what you read please let us know at your next visit so we can discuss your concerns and take corrective action if need be.  We are right here with you.

## 2021-08-27 NOTE — Telephone Encounter (Signed)
Pt called  regarding his procedure at the hospital.  He wants a later arrival time (around 10), even if it has to be changed to a different day.  He is having difficulty finding someone who can bring and stay with him at the earlier times.  Please call patient.  Thank you.

## 2021-08-27 NOTE — Progress Notes (Signed)
Chief Complaint:   OBESITY [name] is here to discuss his progress with his obesity treatment plan along with follow-up of his obesity related diagnoses. [name] is on the Category 2 Plan and states he is following his eating plan approximately 33% of the time. [name] states he is not currently exercising.  Today's visit was #: 2 Starting weight: 208 lbs Starting date: 08/12/2021 Today's weight: 204 lbs Today's date: 08/26/2021 Total lbs lost to date: 4 Total lbs lost since last in-office visit: 4  Interim History: [name] is here today for his first follow-up office visit since starting the program with [place].  All blood work/ lab tests that were recently ordered by myself or an outside provider were reviewed with patient today per their request.   Extended time was spent counseling him on all new disease processes that were discovered or preexisting ones that are affected by BMI.  he understands that many of these abnormalities will need to monitored regularly along with the current treatment plan of prudent dietary changes, in which we are making each and every office visit, to improve these health parameters. [name] is here for a follow up office visit.  We reviewed his meal plan and questions were answered.  Patient's food recall appears to be accurate and consistent with what is on plan when he is following it.   When eating on plan, his hunger and cravings are well controlled.   [name]'s refrigerator went out so it was difficult for him to eat on plan. He had bladder surgery and had an altered diet for a couple of days. Pt is drinking 64 oz of water per day. He is going to the beach for a week October 17-21.  Subjective:   1. Other hyperlipidemia New. Discussed labs with patient today. Upon questioning pt, he states his PCP recently mentioned  he has cholesterol issues and was told to take a medicine, but pt hasn't started it yet.   The 10-year ASCVD risk score (Arnett DK, et al., 2019)  is: 13.4%   Values used to calculate the score:     Age: 60 years     Sex: Male     Is Non-Hispanic African American: No     Diabetic: No     Tobacco smoker: No     Systolic Blood Pressure: 076 mmHg     Is BP treated: No     HDL Cholesterol: 32 mg/dL     Total Cholesterol: 230 mg/dL  2. Vitamin D deficiency New. Discussed labs with patient today. No history of Vit D deficiency.   3. Gastroesophageal reflux disease, unspecified whether esophagitis present Symptoms are stable on meal plan. Pt has no issues or concerns today. She is on Prilosec QD, as directed by his PCP.  4. Pre-diabetes New. Discussed labs with patient today. [name] loves his M&Ms and tortilla chips.  5. At risk for diabetes mellitus [name] is at higher than average risk for developing diabetes due to obesity.   Assessment/Plan:  No orders of the defined types were placed in this encounter.   There are no discontinued medications.   Meds ordered this encounter  Medications   Vitamin D, Ergocalciferol, (DRISDOL) 1.25 MG (50000 UNIT) CAPS capsule    Sig: Take 1 capsule (50,000 Units total) by mouth every 7 (seven) days.    Dispense:  4 capsule    Refill:  0     1. Other hyperlipidemia I recommend pt start statin  medication. He will contact PCP to start it. Also decrease saturated and trans fats by following prudent nutritional plan. We will monitor alongside PCP. Education done extensively on low diet and lifestyle can affect these levels. Cardiovascular risk and specific lipid/LDL goals reviewed.  We discussed several lifestyle modifications today and [name] will continue to work on diet, exercise and weight loss efforts. Orders and follow up as documented in patient record.   Counseling Intensive lifestyle modifications are the first line treatment for this issue. Dietary changes: Increase soluble fiber. Decrease simple carbohydrates. Exercise changes: Moderate to vigorous-intensity aerobic activity 150 minutes  per week if tolerated. Lipid-lowering medications: see documented in medical record.  2. Vitamin D deficiency Plan: - Discussed importance of vitamin D to their health and well-being.  - possible symptoms of low Vitamin D can be low energy, depressed mood, muscle aches, joint aches, osteoporosis etc. - low Vitamin D levels may be linked to an increased risk of cardiovascular events and even increased risk of cancers- such as colon and breast.  - I recommend pt take a 50,000 IU weekly prescription vit D - see script below   - Informed patient this may be a lifelong thing, and he was encouraged to continue to take the medicine until told otherwise.   - we will need to monitor levels regularly (every 3-4 mo on average) to keep levels within normal limits.  - weight loss will likely improve availability of vitamin D, thus encouraged [name] to continue with meal plan and their weight loss efforts to further improve this condition - pt's questions and concerns regarding this condition addressed.  Start- Vitamin D, Ergocalciferol, (DRISDOL) 1.25 MG (50000 UNIT) CAPS capsule; Take 1 capsule (50,000 Units total) by mouth every 7 (seven) days.  Dispense: 4 capsule; Refill: 0  3. Gastroesophageal reflux disease, unspecified whether esophagitis present Intensive lifestyle modifications are the first line treatment for this issue. We discussed several lifestyle modifications today and he will continue to work on diet, exercise and weight loss efforts. Orders and follow up as documented in patient record. Continue prudent nutritional plan, weight loss, and avoid triggers.  Counseling If a person has gastroesophageal reflux disease (GERD), food and stomach acid move back up into the esophagus and cause symptoms or problems such as damage to the esophagus. Anti-reflux measures include: raising the head of the bed, avoiding tight clothing or belts, avoiding eating late at night, not lying down shortly after  mealtime, and achieving weight loss. Avoid ASA, NSAID's, caffeine, alcohol, and tobacco.  OTC Pepcid and/or Tums are often very helpful for as needed use.  However, for persisting chronic or daily symptoms, stronger medications like Omeprazole may be needed. You may need to avoid foods and drinks such as: Coffee and tea (with or without caffeine). Drinks that contain alcohol. Energy drinks and sports drinks. Bubbly (carbonated) drinks or sodas. Chocolate and cocoa. Peppermint and mint flavorings. Garlic and onions. Horseradish. Spicy and acidic foods. These include peppers, chili powder, curry powder, vinegar, hot sauces, and BBQ sauce. Citrus fruit juices and citrus fruits, such as oranges, lemons, and limes. Tomato-based foods. These include red sauce, chili, salsa, and pizza with red sauce. Fried and fatty foods. These include donuts, french fries, potato chips, and high-fat dressings. High-fat meats. These include hot dogs, rib eye steak, sausage, ham, and bacon.  4. Pre-diabetes [name] will continue to work on increasing proteins, following meal plan, weight loss, exercise, and decreasing simple carbohydrates to help decrease the risk of diabetes.  Recheck in 3 months.  5. At risk for diabetes mellitus [name] was given approximately 23 minutes of diabetes education and counseling today. We discussed intensive lifestyle modifications today with an emphasis on weight loss as well as increasing exercise and decreasing simple carbohydrates in his diet. We also reviewed medication options with an emphasis on risk versus benefit of those discussed.   Repetitive spaced learning was employed today to elicit superior memory formation and behavioral change.  6. Obesity with current BMI of 30.2  [name] is currently in the action stage of change. As such, his goal is to continue with weight loss efforts. He has agreed to the Category 2 Plan.   Eating Out and travel eating strategies discussed with  pt.  Exercise goals:  As is  Behavioral modification strategies: increasing lean protein intake, decreasing simple carbohydrates, travel eating strategies, and planning for success.  [name] has agreed to follow-up with our clinic in 3 weeks (pt is going to the beach for 1 week). He was informed of the importance of frequent follow-up visits to maximize his success with intensive lifestyle modifications for his multiple health conditions.   Objective:   Blood pressure 128/81, pulse 69, height 5\' 9"  (1.753 m), weight 204 lb (92.5 kg), SpO2 98 %. Body mass index is 30.13 kg/m.  General: Cooperative, alert, well developed, in no acute distress. HEENT: Conjunctivae and lids unremarkable. Cardiovascular: Regular rhythm.  Lungs: Normal work of breathing. Neurologic: No focal deficits.   Lab Results  Component Value Date   CREATININE 1.04 08/12/2021   BUN 12 08/12/2021   NA 137 08/12/2021   K 4.0 08/12/2021   CL 98 08/12/2021   CO2 22 08/12/2021   Lab Results  Component Value Date   ALT 18 08/12/2021   AST 17 08/12/2021   ALKPHOS 100 08/12/2021   BILITOT 0.6 08/12/2021   Lab Results  Component Value Date   HGBA1C 5.9 (H) 08/12/2021   Lab Results  Component Value Date   INSULIN 12.0 08/12/2021   Lab Results  Component Value Date   TSH 1.510 08/12/2021   Lab Results  Component Value Date   CHOL 230 (H) 08/12/2021   HDL 32 (L) 08/12/2021   LDLCALC 173 (H) 08/12/2021   TRIG 135 08/12/2021   Lab Results  Component Value Date   VD25OH 41.6 08/12/2021   Lab Results  Component Value Date   WBC 7.0 08/12/2021   HGB 15.4 08/12/2021   HCT 46.2 08/12/2021   MCV 89 08/12/2021   PLT 225 08/12/2021    Attestation Statements:   Reviewed by clinician on day of visit: allergies, medications, problem list, medical history, surgical history, family history, social history, and previous encounter notes.  Coral Ceo, CMA, am acting as transcriptionist for [employer], DO.  I have reviewed the above documentation for accuracy and completeness, and I agree with the above. Marjory Sneddon, D.O.  The [place] was signed into law in 2016 which includes the topic of electronic health records.  This provides immediate access to information in MyChart.  This includes consultation notes, operative notes, office notes, lab results and pathology reports.  If you have any questions about what you read please let us know at your next visit so we can discuss your concerns and take corrective action if need be.  We are right here with you.

## 2021-08-28 NOTE — Telephone Encounter (Signed)
Left message for patient to return my call.

## 2021-08-29 NOTE — Telephone Encounter (Signed)
Patient states it is hard to find a driver that will bring him to the hospital that early in the morning for his colonoscopy. Offered patient 09/23/21  at 10:15 am arrival of 8:45 am. Patient agreed and went over new dates and times of procedure. Patient verbalized understanding.

## 2021-09-08 DIAGNOSIS — L821 Other seborrheic keratosis: Secondary | ICD-10-CM | POA: Diagnosis not present

## 2021-09-08 DIAGNOSIS — C44622 Squamous cell carcinoma of skin of right upper limb, including shoulder: Secondary | ICD-10-CM | POA: Diagnosis not present

## 2021-09-08 DIAGNOSIS — L820 Inflamed seborrheic keratosis: Secondary | ICD-10-CM | POA: Diagnosis not present

## 2021-09-08 DIAGNOSIS — D485 Neoplasm of uncertain behavior of skin: Secondary | ICD-10-CM | POA: Diagnosis not present

## 2021-09-16 ENCOUNTER — Encounter (INDEPENDENT_AMBULATORY_CARE_PROVIDER_SITE_OTHER): Payer: Self-pay

## 2021-09-16 ENCOUNTER — Ambulatory Visit (INDEPENDENT_AMBULATORY_CARE_PROVIDER_SITE_OTHER): Payer: BC Managed Care – PPO | Admitting: Family Medicine

## 2021-09-17 ENCOUNTER — Encounter (INDEPENDENT_AMBULATORY_CARE_PROVIDER_SITE_OTHER): Payer: Self-pay | Admitting: Adult Health

## 2021-09-17 ENCOUNTER — Ambulatory Visit (INDEPENDENT_AMBULATORY_CARE_PROVIDER_SITE_OTHER): Payer: BC Managed Care – PPO | Admitting: Adult Health

## 2021-09-17 ENCOUNTER — Other Ambulatory Visit: Payer: Self-pay

## 2021-09-17 VITALS — BP 117/70 | HR 84 | Temp 97.9°F | Ht 69.0 in | Wt 205.0 lb

## 2021-09-17 DIAGNOSIS — R7303 Prediabetes: Secondary | ICD-10-CM | POA: Diagnosis not present

## 2021-09-17 DIAGNOSIS — Z9189 Other specified personal risk factors, not elsewhere classified: Secondary | ICD-10-CM | POA: Diagnosis not present

## 2021-09-17 DIAGNOSIS — Z6830 Body mass index (BMI) 30.0-30.9, adult: Secondary | ICD-10-CM

## 2021-09-17 DIAGNOSIS — E559 Vitamin D deficiency, unspecified: Secondary | ICD-10-CM | POA: Diagnosis not present

## 2021-09-17 DIAGNOSIS — E669 Obesity, unspecified: Secondary | ICD-10-CM

## 2021-09-17 MED ORDER — VITAMIN D (ERGOCALCIFEROL) 1.25 MG (50000 UNIT) PO CAPS: 50000.0000 [IU] | ORAL_CAPSULE | ORAL | 0 refills | Status: DC

## 2021-09-17 NOTE — Progress Notes (Signed)
Chief Complaint:   OBESITY [name] is here to discuss his progress with his obesity treatment plan along with follow-up of his obesity related diagnoses. [name] is on the Category 2 Plan and states he is following his eating plan approximately 30% of the time. [name] states he walked on vacation.  Today's visit was #: 3 Starting weight: 208 lbs Starting date: 08/12/2021 Today's weight: 205 lbs Today's date: 09/17/2021 Total lbs lost to date: 3 lbs Total lbs lost since last in-office visit: 0  Interim History: [name] went to [employer] for a 5 day vacation - 9 people in the [employer]. He is pleased to only have gained 1 pound. He enjoys the foods/structure of the Category 2 plan and states "I'm not hungry on the plan and it works!"  Of note:  Small bowel obstruction in 09/2017.  His weight loss goal is to focus on reduction of abdominal adiposity.  Subjective:   1. Vitamin D deficiency Vitamin D level on 08/12/2021 - 41.6 - below goal of 50. He is currently taking prescription ergocalciferol 50,000 IU each week. He denies nausea, vomiting or muscle weakness.  2. Prediabetes On 08/12/2020, A1c was 5.9. He denies family history of T2D. He denies polyphagia.  He has never been on any BG lowering medications.  3. At risk for osteoporosis [name] is at higher risk of osteopenia and osteoporosis due to Vitamin D deficiency and obesity.   Assessment/Plan:   1. Vitamin D deficiency Refill ergocalciferol 50,000 IU once weekly, as per below.  - Refill Vitamin D, Ergocalciferol, (DRISDOL) 1.25 MG (50000 UNIT) CAPS capsule; Take 1 capsule (50,000 Units total) by mouth every 7 (seven) days.  Dispense: 4 capsule; Refill: 0  2. Prediabetes Increase protein intake and decrease sugar/carbohydrates.  3. At risk for osteoporosis [name] was given approximately 15 minutes of osteoporosis prevention counseling today. [name] is at risk for osteopenia and osteoporosis due to his Vitamin D deficiency. He  was encouraged to take his Vitamin D and follow his higher calcium diet and increase strengthening exercise to help strengthen his bones and decrease his risk of osteopenia and osteoporosis.  Repetitive spaced learning was employed today to elicit superior memory formation and behavioral change.  4. Obesity with current BMI of 30.3  [name] is currently in the action stage of change. As such, his goal is to continue with weight loss efforts. He has agreed to the Category 2 Plan.   Exercise goals:  As is.  Behavioral modification strategies: increasing lean protein intake, decreasing simple carbohydrates, increasing water intake, meal planning and cooking strategies, keeping healthy foods in the home, and planning for success.  [name] has agreed to follow-up with our clinic in 2 weeks. He was informed of the importance of frequent follow-up visits to maximize his success with intensive lifestyle modifications for his multiple health conditions.   Objective:   Blood pressure 117/70, pulse 84, temperature 97.9 F (36.6 C), height 5\' 9"  (1.753 m), weight 205 lb (93 kg), SpO2 97 %. Body mass index is 30.27 kg/m.  General: Cooperative, alert, well developed, in no acute distress. HEENT: Conjunctivae and lids unremarkable. Cardiovascular: Regular rhythm.  Lungs: Normal work of breathing. Neurologic: No focal deficits.   Lab Results  Component Value Date   CREATININE 1.04 08/12/2021   BUN 12 08/12/2021   NA 137 08/12/2021   K 4.0 08/12/2021   CL 98 08/12/2021   CO2 22 08/12/2021   Lab Results  Component Value Date  ALT 18 08/12/2021   AST 17 08/12/2021   ALKPHOS 100 08/12/2021   BILITOT 0.6 08/12/2021   Lab Results  Component Value Date   HGBA1C 5.9 (H) 08/12/2021   Lab Results  Component Value Date   INSULIN 12.0 08/12/2021   Lab Results  Component Value Date   TSH 1.510 08/12/2021   Lab Results  Component Value Date   CHOL 230 (H) 08/12/2021   HDL 32 (L) 08/12/2021    LDLCALC 173 (H) 08/12/2021   TRIG 135 08/12/2021   Lab Results  Component Value Date   VD25OH 41.6 08/12/2021   Lab Results  Component Value Date   WBC 7.0 08/12/2021   HGB 15.4 08/12/2021   HCT 46.2 08/12/2021   MCV 89 08/12/2021   PLT 225 08/12/2021   Attestation Statements:   Reviewed by clinician on day of visit: allergies, medications, problem list, medical history, surgical history, family history, social history, and previous encounter notes.  I, Water quality scientist, CMA, am acting as Location manager for Mina Marble, NP.  I have reviewed the above documentation for accuracy and completeness, and I agree with the above. -  Sadee Osland d. Hanny Elsberry, NP-C

## 2021-09-22 NOTE — Anesthesia Preprocedure Evaluation (Addendum)
Anesthesia Evaluation  Patient identified by MRN, date of birth, ID band Patient awake    Reviewed: Allergy & Precautions, NPO status , Patient's Chart, lab work & pertinent test results  Airway Mallampati: III  TM Distance: >3 FB Neck ROM: Full  Mouth opening: Limited Mouth Opening  Dental  (+) Teeth Intact, Dental Advisory Given   Pulmonary neg pulmonary ROS,    Pulmonary exam normal breath sounds clear to auscultation       Cardiovascular negative cardio ROS Normal cardiovascular exam Rhythm:Regular Rate:Normal     Neuro/Psych PSYCHIATRIC DISORDERS Dementia negative neurological ROS     GI/Hepatic Neg liver ROS, GERD  Controlled and Medicated,  Endo/Other  negative endocrine ROS  Renal/GU negative Renal ROS  negative genitourinary   Musculoskeletal negative musculoskeletal ROS (+)   Abdominal   Peds  Hematology negative hematology ROS (+)   Anesthesia Other Findings   Reproductive/Obstetrics                            Anesthesia Physical Anesthesia Plan  ASA: 2  Anesthesia Plan: MAC   Post-op Pain Management:    Induction: Intravenous  PONV Risk Score and Plan: Propofol infusion and Treatment may vary due to age or medical condition  Airway Management Planned: Natural Airway  Additional Equipment:   Intra-op Plan:   Post-operative Plan:   Informed Consent: I have reviewed the patients History and Physical, chart, labs and discussed the procedure including the risks, benefits and alternatives for the proposed anesthesia with the patient or authorized representative who has indicated his/her understanding and acceptance.     Dental advisory given  Plan Discussed with: CRNA  Anesthesia Plan Comments:         Anesthesia Quick Evaluation

## 2021-09-23 ENCOUNTER — Encounter (HOSPITAL_COMMUNITY): Payer: Self-pay | Admitting: Gastroenterology

## 2021-09-23 ENCOUNTER — Ambulatory Visit (HOSPITAL_COMMUNITY): Payer: BC Managed Care – PPO | Admitting: Anesthesiology

## 2021-09-23 ENCOUNTER — Encounter (HOSPITAL_COMMUNITY)
Admission: RE | Disposition: A | Discharge status: Home / Self Care | Payer: Self-pay | Source: Ambulatory Visit | Attending: Gastroenterology

## 2021-09-23 ENCOUNTER — Other Ambulatory Visit: Payer: Self-pay

## 2021-09-23 ENCOUNTER — Ambulatory Visit (HOSPITAL_COMMUNITY)
Admission: RE | Admit: 2021-09-23 | Discharge: 2021-09-23 | Disposition: A | Discharge status: Home / Self Care | Payer: BC Managed Care – PPO | Source: Ambulatory Visit | Attending: Gastroenterology | Admitting: Gastroenterology

## 2021-09-23 DIAGNOSIS — K635 Polyp of colon: Secondary | ICD-10-CM | POA: Diagnosis not present

## 2021-09-23 DIAGNOSIS — D124 Benign neoplasm of descending colon: Secondary | ICD-10-CM

## 2021-09-23 DIAGNOSIS — K5730 Diverticulosis of large intestine without perforation or abscess without bleeding: Secondary | ICD-10-CM | POA: Insufficient documentation

## 2021-09-23 DIAGNOSIS — E559 Vitamin D deficiency, unspecified: Secondary | ICD-10-CM | POA: Insufficient documentation

## 2021-09-23 DIAGNOSIS — Z1211 Encounter for screening for malignant neoplasm of colon: Secondary | ICD-10-CM | POA: Insufficient documentation

## 2021-09-23 DIAGNOSIS — E7849 Other hyperlipidemia: Secondary | ICD-10-CM | POA: Diagnosis not present

## 2021-09-23 DIAGNOSIS — Z860100 Personal history of colon polyps, unspecified: Secondary | ICD-10-CM

## 2021-09-23 DIAGNOSIS — K219 Gastro-esophageal reflux disease without esophagitis: Secondary | ICD-10-CM | POA: Insufficient documentation

## 2021-09-23 DIAGNOSIS — D125 Benign neoplasm of sigmoid colon: Secondary | ICD-10-CM | POA: Diagnosis not present

## 2021-09-23 DIAGNOSIS — K640 First degree hemorrhoids: Secondary | ICD-10-CM | POA: Insufficient documentation

## 2021-09-23 DIAGNOSIS — E669 Obesity, unspecified: Secondary | ICD-10-CM | POA: Insufficient documentation

## 2021-09-23 DIAGNOSIS — Z6830 Body mass index (BMI) 30.0-30.9, adult: Secondary | ICD-10-CM | POA: Diagnosis not present

## 2021-09-23 DIAGNOSIS — R7303 Prediabetes: Secondary | ICD-10-CM | POA: Diagnosis not present

## 2021-09-23 HISTORY — PX: COLONOSCOPY WITH PROPOFOL: SHX5780

## 2021-09-23 HISTORY — PX: POLYPECTOMY: SHX5525

## 2021-09-23 SURGERY — COLONOSCOPY WITH PROPOFOL
Anesthesia: Monitor Anesthesia Care

## 2021-09-23 MED ORDER — PROPOFOL 500 MG/50ML IV EMUL
INTRAVENOUS | Status: DC | PRN
  Administered 2021-09-23: 125 ug/kg/min via INTRAVENOUS

## 2021-09-23 MED ORDER — SODIUM CHLORIDE 0.9 % IV SOLN: INTRAVENOUS | Status: DC

## 2021-09-23 MED ORDER — PROPOFOL 500 MG/50ML IV EMUL
INTRAVENOUS | Status: AC
  Filled 2021-09-23: qty 100

## 2021-09-23 MED ORDER — LACTATED RINGERS IV SOLN: INTRAVENOUS | Status: DC

## 2021-09-23 MED ORDER — PROPOFOL 10 MG/ML IV BOLUS
INTRAVENOUS | Status: DC | PRN
  Administered 2021-09-23: 10 mg via INTRAVENOUS

## 2021-09-23 MED ORDER — LIDOCAINE 2% (20 MG/ML) 5 ML SYRINGE
INTRAMUSCULAR | Status: DC | PRN
  Administered 2021-09-23: 100 mg via INTRAVENOUS

## 2021-09-23 MED ORDER — PROPOFOL 1000 MG/100ML IV EMUL
INTRAVENOUS | Status: AC
  Filled 2021-09-23: qty 200

## 2021-09-23 SURGICAL SUPPLY — 22 items

## 2021-09-23 NOTE — Interval H&P Note (Signed)
History and Physical Interval Note:  09/23/2021 9:36 AM  [name] [name]  has presented today for surgery, with the diagnosis of hx of polyps.  The various methods of treatment have been discussed with the patient and family. After consideration of risks, benefits and other options for treatment, the patient has consented to  Procedure(s): COLONOSCOPY WITH PROPOFOL (N/Douglas) as Douglas surgical intervention.  The patient's history has been reviewed, patient examined, no change in status, stable for surgery.  I have reviewed the patient's chart and labs.  Questions were answered to the patient's satisfaction.     Pricilla Riffle. Fuller Plan

## 2021-09-23 NOTE — Transfer of Care (Signed)
Immediate Anesthesia Transfer of Care Note  Patient: [name]  Procedure(s) Performed: COLONOSCOPY WITH PROPOFOL POLYPECTOMY  Patient Location: Endoscopy Unit  Anesthesia Type:MAC  Level of Consciousness: awake, alert  and oriented  Airway & Oxygen Therapy: Patient Spontanous Breathing and Patient connected to face mask oxygen  Post-op Assessment: Report given to RN and Post -op Vital signs reviewed and stable  Post vital signs: Reviewed and stable  Last Vitals:  Vitals Value Taken Time  BP 144/72   Temp    Pulse 89 09/23/21 1103  Resp 22 09/23/21 1103  SpO2 100 % 09/23/21 1103  Vitals shown include unvalidated device data.  Last Pain:  Vitals:   09/23/21 0940  TempSrc: Oral  PainSc: 0-No pain         Complications: No notable events documented.

## 2021-09-23 NOTE — Anesthesia Postprocedure Evaluation (Signed)
Anesthesia Post Note  Patient: [name]  Procedure(s) Performed: COLONOSCOPY WITH PROPOFOL POLYPECTOMY     Patient location during evaluation: Endoscopy Anesthesia Type: MAC Level of consciousness: awake and alert Pain management: pain level controlled Vital Signs Assessment: post-procedure vital signs reviewed and stable Respiratory status: spontaneous breathing, nonlabored ventilation, respiratory function stable and patient connected to nasal cannula oxygen Cardiovascular status: blood pressure returned to baseline and stable Postop Assessment: no apparent nausea or vomiting Anesthetic complications: no   No notable events documented.  Last Vitals:  Vitals:   09/23/21 1116 09/23/21 1126  BP: 132/79 139/82  Pulse: 81 75  Resp: 18 (!) 22  Temp:    SpO2: 100% 98%    Last Pain:  Vitals:   09/23/21 1126  TempSrc:   PainSc: 0-No pain                 Mylz Yuan L Trivia Heffelfinger

## 2021-09-23 NOTE — Discharge Instructions (Signed)

## 2021-09-23 NOTE — Op Note (Signed)
[facility] Patient Name: [name] Procedure Date: 09/23/2021 MRN: [id number] Attending MD: Ladene Artist , MD Date of Birth: [birth year] CSN: [id number] Age: 60 Admit Type: Outpatient Procedure:                Colonoscopy Indications:              High risk colon cancer surveillance: Personal                            history of sessile serrated colon polyp (less than                            10 mm in size) with no dysplasia, Personal history                            of traditional serrated adenoma of the colon Providers:                Pricilla Riffle. Fuller Plan, MD, Jeanella Cara, RN,                            Frazier Richards, Technician, Cherylynn Ridges, Technician Referring MD:             Redmond [employer], MD Medicines:                Monitored Anesthesia Care Complications:            No immediate complications. Estimated blood loss:                            None. Estimated Blood Loss:     Estimated blood loss: none. Procedure:                Pre-Anesthesia Assessment:                           - Prior to the procedure, a History and Physical                            was performed, and patient medications and                            allergies were reviewed. The patient's tolerance of                            previous anesthesia was also reviewed. The risks                            and benefits of the procedure and the sedation                            options and risks were discussed with the patient.                            All questions were answered, and informed consent  was obtained. Prior Anticoagulants: The patient has                            taken no previous anticoagulant or antiplatelet                            agents. ASA Grade Assessment: II - A patient with                            mild systemic disease. After reviewing the risks                            and benefits, the patient was deemed in                             satisfactory condition to undergo the procedure.                           After obtaining informed consent, the colonoscope                            was passed under direct vision. Throughout the                            procedure, the patient's blood pressure, pulse, and                            oxygen saturations were monitored continuously. The                            [id number]-HQ190L ([id number]) Olympus colonoscope was                            introduced through the anus and advanced to the the                            cecum, identified by appendiceal orifice and                            ileocecal valve. The ileocecal valve, appendiceal                            orifice, and rectum were photographed. The quality                            of the bowel preparation was good. The colonoscopy                            was performed without difficulty. The patient                            tolerated the procedure well. Scope In: 10:37:16 AM Scope Out: 10:57:05 AM Scope Withdrawal Time: 0 hours 17 minutes 14 seconds  Total Procedure Duration: 0  hours 19 minutes 49 seconds  Findings:      The perianal and digital rectal examinations were normal.      Four sessile polyps were found in the sigmoid colon (2) and descending       colon (2). The polyps were 6 to 8 mm in size. These polyps were removed       with a cold snare. Resection and retrieval were complete.      A few small-mouthed diverticula were found in the left colon. There was       no evidence of diverticular bleeding.      Internal hemorrhoids were found during retroflexion. The hemorrhoids       were small and Grade I (internal hemorrhoids that do not prolapse).      The exam was otherwise without abnormality on direct and retroflexion       views. Impression:               - Four 6 to 8 mm polyps in the sigmoid colon and in                            the descending colon, removed with a cold snare.                             Resected and retrieved.                           - Mild diverticulosis in the left colon.                           - Internal hemorrhoids.                           - The examination was otherwise normal on direct                            and retroflexion views. Moderate Sedation:      Not Applicable - Patient had care per Anesthesia. Recommendation:           - Repeat colonoscopy after studies are complete for                            surveillance based on pathology results.                           - Patient has a contact number available for                            emergencies. The signs and symptoms of potential                            delayed complications were discussed with the                            patient. Return to normal activities tomorrow.  Written discharge instructions were provided to the                            patient.                           - High fiber diet.                           - Continue present medications.                           - Await pathology results. Procedure Code(s):        --- Professional ---                           [phone], Colonoscopy, flexible; with removal of                            tumor(s), polyp(s), or other lesion(s) by snare                            technique Diagnosis Code(s):        --- Professional ---                           K63.5, Polyp of colon                           K64.0, First degree hemorrhoids                           Z86.010, Personal history of colonic polyps                           K57.30, Diverticulosis of large intestine without                            perforation or abscess without bleeding CPT copyright 2019 American [employer]. All rights reserved. The codes documented in this report are preliminary and upon coder review may  be revised to meet current compliance requirements. Ladene Artist, MD 09/23/2021 11:03:41  AM This report has been signed electronically. Number of Addenda: 0

## 2021-09-24 LAB — SURGICAL PATHOLOGY

## 2021-09-26 ENCOUNTER — Encounter (HOSPITAL_COMMUNITY): Payer: Self-pay | Admitting: Gastroenterology

## 2021-10-01 ENCOUNTER — Encounter (INDEPENDENT_AMBULATORY_CARE_PROVIDER_SITE_OTHER): Payer: Self-pay | Admitting: Family Medicine

## 2021-10-01 ENCOUNTER — Ambulatory Visit (INDEPENDENT_AMBULATORY_CARE_PROVIDER_SITE_OTHER): Payer: BC Managed Care – PPO | Admitting: Family Medicine

## 2021-10-01 ENCOUNTER — Other Ambulatory Visit: Payer: Self-pay

## 2021-10-01 ENCOUNTER — Encounter: Payer: Self-pay | Admitting: Gastroenterology

## 2021-10-01 VITALS — BP 137/79 | HR 70 | Temp 98.1°F | Ht 69.0 in | Wt 202.0 lb

## 2021-10-01 DIAGNOSIS — Z6830 Body mass index (BMI) 30.0-30.9, adult: Secondary | ICD-10-CM

## 2021-10-01 DIAGNOSIS — E669 Obesity, unspecified: Secondary | ICD-10-CM

## 2021-10-01 DIAGNOSIS — Z9189 Other specified personal risk factors, not elsewhere classified: Secondary | ICD-10-CM | POA: Diagnosis not present

## 2021-10-01 DIAGNOSIS — E559 Vitamin D deficiency, unspecified: Secondary | ICD-10-CM

## 2021-10-01 MED ORDER — VITAMIN D (ERGOCALCIFEROL) 1.25 MG (50000 UNIT) PO CAPS: 50000.0000 [IU] | ORAL_CAPSULE | ORAL | 0 refills | Status: DC

## 2021-10-01 NOTE — Progress Notes (Signed)
Chief Complaint:   OBESITY [name] is here to discuss his progress with his obesity treatment plan along with follow-up of his obesity related diagnoses. [name] is on the Category 2 Plan and states he is following his eating plan approximately 75% of the time. [name] states he is walking 2-5 minutes 7 times per week.  Today's visit was #: 4 Starting weight: 208 lbs Starting date: 08/12/2021 Today's weight: 202 lbs Today's date: 10/01/2021 Total lbs lost to date: 6 Total lbs lost since last in-office visit: 3  Interim History: [name] had a urethral upper lift recently and no longer has to self cath. He is feeling much better. Pt also had a colonoscopy. He is getting his protein in and being intentional about that. Pt is only getting 60 oz of water. He is not hungry at all.  Subjective:   1. Vitamin D deficiency He is currently taking prescription vitamin D 50,000 IU each week. He denies nausea, vomiting or muscle weakness.  2. At risk for constipation [name] is at risk for constipation due to inadequate water intake.  Assessment/Plan:  No orders of the defined types were placed in this encounter.   Medications Discontinued During This Encounter  Medication Reason   Vitamin D, Ergocalciferol, (DRISDOL) 1.25 MG (50000 UNIT) CAPS capsule Reorder     Meds ordered this encounter  Medications   Vitamin D, Ergocalciferol, (DRISDOL) 1.25 MG (50000 UNIT) CAPS capsule    Sig: Take 1 capsule (50,000 Units total) by mouth every 7 (seven) days.    Dispense:  4 capsule    Refill:  0     1. Vitamin D deficiency Low Vitamin D level contributes to fatigue and are associated with obesity, breast, and colon cancer. He agrees to continue to take prescription Vitamin D 50,000 IU every week and will follow-up for routine testing of Vitamin D, at least 2-3 times per year to avoid over-replacement.  Refill- Vitamin D, Ergocalciferol, (DRISDOL) 1.25 MG (50000 UNIT) CAPS capsule; Take 1 capsule (50,000  Units total) by mouth every 7 (seven) days.  Dispense: 4 capsule; Refill: 0  2. At risk for constipation [name] was given approximately 9 minutes of counseling today regarding prevention of constipation. He was encouraged to increase water and fiber intake.   3. Obesity with current BMI of 29.9  [name] is currently in the action stage of change. As such, his goal is to continue with weight loss efforts. He has agreed to Change to the Category 1 Plan.   Exercise goals:  As is  Behavioral modification strategies: increasing lean protein intake, decreasing simple carbohydrates, and planning for success.  [name] has agreed to follow-up with our clinic in 2 weeks. He was informed of the importance of frequent follow-up visits to maximize his success with intensive lifestyle modifications for his multiple health conditions.   Objective:   Blood pressure 137/79, pulse 70, temperature 98.1 F (36.7 C), height 5\' 9"  (1.753 m), weight 202 lb (91.6 kg), SpO2 97 %. Body mass index is 29.83 kg/m.  General: Cooperative, alert, well developed, in no acute distress. HEENT: Conjunctivae and lids unremarkable. Cardiovascular: Regular rhythm.  Lungs: Normal work of breathing. Neurologic: No focal deficits.   Lab Results  Component Value Date   CREATININE 1.04 08/12/2021   BUN 12 08/12/2021   NA 137 08/12/2021   K 4.0 08/12/2021   CL 98 08/12/2021   CO2 22 08/12/2021   Lab Results  Component Value Date   ALT 18 08/12/2021  AST 17 08/12/2021   ALKPHOS 100 08/12/2021   BILITOT 0.6 08/12/2021   Lab Results  Component Value Date   HGBA1C 5.9 (H) 08/12/2021   Lab Results  Component Value Date   INSULIN 12.0 08/12/2021   Lab Results  Component Value Date   TSH 1.510 08/12/2021   Lab Results  Component Value Date   CHOL 230 (H) 08/12/2021   HDL 32 (L) 08/12/2021   LDLCALC 173 (H) 08/12/2021   TRIG 135 08/12/2021   Lab Results  Component Value Date   VD25OH 41.6 08/12/2021   Lab  Results  Component Value Date   WBC 7.0 08/12/2021   HGB 15.4 08/12/2021   HCT 46.2 08/12/2021   MCV 89 08/12/2021   PLT 225 08/12/2021    Attestation Statements:   Reviewed by clinician on day of visit: allergies, medications, problem list, medical history, surgical history, family history, social history, and previous encounter notes.  Coral Ceo, CMA, am acting as transcriptionist for [employer], DO.  I have reviewed the above documentation for accuracy and completeness, and I agree with the above. Marjory Sneddon, D.O.  The [place] was signed into law in 2016 which includes the topic of electronic health records.  This provides immediate access to information in MyChart.  This includes consultation notes, operative notes, office notes, lab results and pathology reports.  If you have any questions about what you read please let us know at your next visit so we can discuss your concerns and take corrective action if need be.  We are right here with you.

## 2021-10-14 DIAGNOSIS — R030 Elevated blood-pressure reading, without diagnosis of hypertension: Secondary | ICD-10-CM | POA: Diagnosis not present

## 2021-10-14 DIAGNOSIS — G8929 Other chronic pain: Secondary | ICD-10-CM | POA: Diagnosis not present

## 2021-10-14 DIAGNOSIS — M542 Cervicalgia: Secondary | ICD-10-CM | POA: Diagnosis not present

## 2021-10-14 DIAGNOSIS — M545 Low back pain, unspecified: Secondary | ICD-10-CM | POA: Diagnosis not present

## 2021-10-22 ENCOUNTER — Encounter (INDEPENDENT_AMBULATORY_CARE_PROVIDER_SITE_OTHER): Payer: Self-pay | Admitting: Family Medicine

## 2021-10-22 ENCOUNTER — Other Ambulatory Visit: Payer: Self-pay

## 2021-10-22 ENCOUNTER — Ambulatory Visit (INDEPENDENT_AMBULATORY_CARE_PROVIDER_SITE_OTHER): Payer: BC Managed Care – PPO | Admitting: Family Medicine

## 2021-10-22 VITALS — BP 138/74 | HR 62 | Temp 98.0°F | Ht 69.0 in | Wt 207.0 lb

## 2021-10-22 DIAGNOSIS — E669 Obesity, unspecified: Secondary | ICD-10-CM

## 2021-10-22 DIAGNOSIS — Z6830 Body mass index (BMI) 30.0-30.9, adult: Secondary | ICD-10-CM

## 2021-10-22 DIAGNOSIS — M549 Dorsalgia, unspecified: Secondary | ICD-10-CM | POA: Diagnosis not present

## 2021-10-22 DIAGNOSIS — Z9189 Other specified personal risk factors, not elsewhere classified: Secondary | ICD-10-CM | POA: Diagnosis not present

## 2021-10-22 DIAGNOSIS — E559 Vitamin D deficiency, unspecified: Secondary | ICD-10-CM | POA: Diagnosis not present

## 2021-10-22 DIAGNOSIS — G8929 Other chronic pain: Secondary | ICD-10-CM

## 2021-10-22 MED ORDER — VITAMIN D (ERGOCALCIFEROL) 1.25 MG (50000 UNIT) PO CAPS: 50000.0000 [IU] | ORAL_CAPSULE | ORAL | 0 refills | Status: DC

## 2021-10-23 NOTE — Progress Notes (Signed)
Chief Complaint:   OBESITY [name] is here to discuss his progress with his obesity treatment plan along with follow-up of his obesity related diagnoses. [name] is on the Category 1 Plan and states he is following his eating plan approximately 80% of the time. [name] states he is doing 0 minutes 0 times per week.  Today's visit was #: 5 Starting weight: 208 lbs Starting date: 08/12/2021 Today's weight: 207 lbs Today's date: 10/22/2021 Total lbs lost to date: 1 Total lbs lost since last in-office visit: 0  Interim History: [name] was changed from Category 2 to Category 1 at his last office visit, and he likes it. [name] denies hunger or craving. He notes it is working out great, but he has failed with supper. He has been eating off the plan and eating dinner out for 1-2 weeks. He increased his water intake from 60 to 88 oz per day lately.   Subjective:   1. Vitamin D deficiency [name] is currently taking prescription vitamin D 50,000 IU each week. He denies nausea, vomiting or muscle weakness.  2. Other chronic back pain [name] is now seeing Dr. Arnoldo Morale at [facility]. He is doing better with his pain currently, and he is getting an MRI soon.  3. At risk for malnutrition [name] is at increased risk for malnutrition due to current eating habits.  Assessment/Plan:  No orders of the defined types were placed in this encounter.   Medications Discontinued During This Encounter  Medication Reason   Vitamin D, Ergocalciferol, (DRISDOL) 1.25 MG (50000 UNIT) CAPS capsule Reorder     Meds ordered this encounter  Medications   Vitamin D, Ergocalciferol, (DRISDOL) 1.25 MG (50000 UNIT) CAPS capsule    Sig: Take 1 capsule (50,000 Units total) by mouth every 7 (seven) days.    Dispense:  4 capsule    Refill:  0     1. Vitamin D deficiency We will refill prescription Vitamin D for 1 month. Lysle will follow-up for routine testing of Vitamin D, at least 2-3 times per year to avoid  over-replacement.  - Vitamin D, Ergocalciferol, (DRISDOL) 1.25 MG (50000 UNIT) CAPS capsule; Take 1 capsule (50,000 Units total) by mouth every 7 (seven) days.  Dispense: 4 capsule; Refill: 0  2. Other chronic back pain Dany is to walk as needed, and we will follow up at his next office visit.  3. At risk for malnutrition Nadir was given approximately 9 minutes of counseling today regarding prevention of malnutrition and ways to meet macronutrient goals..   4. Obesity with current BMI of 30.6 [name] is currently in the action stage of change. As such, his goal is to continue with weight loss efforts. He has agreed to the Category 1 Plan.   Exercise goals: As is.  Behavioral modification strategies: increasing lean protein intake, decreasing simple carbohydrates, and planning for success.  [name] has agreed to follow-up with our clinic in 2 weeks. He was informed of the importance of frequent follow-up visits to maximize his success with intensive lifestyle modifications for his multiple health conditions.   Objective:   Blood pressure 138/74, pulse 62, temperature 98 F (36.7 C), height 5\' 9"  (1.753 m), weight 207 lb (93.9 kg), SpO2 100 %. Body mass index is 30.57 kg/m.  General: Cooperative, alert, well developed, in no acute distress. HEENT: Conjunctivae and lids unremarkable. Cardiovascular: Regular rhythm.  Lungs: Normal work of breathing. Neurologic: No focal deficits.   Lab Results  Component Value Date  CREATININE 1.04 08/12/2021   BUN 12 08/12/2021   NA 137 08/12/2021   K 4.0 08/12/2021   CL 98 08/12/2021   CO2 22 08/12/2021   Lab Results  Component Value Date   ALT 18 08/12/2021   AST 17 08/12/2021   ALKPHOS 100 08/12/2021   BILITOT 0.6 08/12/2021   Lab Results  Component Value Date   HGBA1C 5.9 (H) 08/12/2021   Lab Results  Component Value Date   INSULIN 12.0 08/12/2021   Lab Results  Component Value Date   TSH 1.510 08/12/2021   Lab Results   Component Value Date   CHOL 230 (H) 08/12/2021   HDL 32 (L) 08/12/2021   LDLCALC 173 (H) 08/12/2021   TRIG 135 08/12/2021   Lab Results  Component Value Date   VD25OH 41.6 08/12/2021   Lab Results  Component Value Date   WBC 7.0 08/12/2021   HGB 15.4 08/12/2021   HCT 46.2 08/12/2021   MCV 89 08/12/2021   PLT 225 08/12/2021   No results found for: IRON, TIBC, FERRITIN  Attestation Statements:   Reviewed by clinician on day of visit: allergies, medications, problem list, medical history, surgical history, family history, social history, and previous encounter notes.   Wilhemena Durie, am acting as transcriptionist for [employer], DO.  I have reviewed the above documentation for accuracy and completeness, and I agree with the above. Marjory Sneddon, D.O.  The [place] was signed into law in 2016 which includes the topic of electronic health records.  This provides immediate access to information in MyChart.  This includes consultation notes, operative notes, office notes, lab results and pathology reports.  If you have any questions about what you read please let us know at your next visit so we can discuss your concerns and take corrective action if need be.  We are right here with you.

## 2021-10-29 DIAGNOSIS — M4327 Fusion of spine, lumbosacral region: Secondary | ICD-10-CM | POA: Diagnosis not present

## 2021-10-29 DIAGNOSIS — M4316 Spondylolisthesis, lumbar region: Secondary | ICD-10-CM | POA: Diagnosis not present

## 2021-10-29 DIAGNOSIS — M2578 Osteophyte, vertebrae: Secondary | ICD-10-CM | POA: Diagnosis not present

## 2021-10-29 DIAGNOSIS — M545 Low back pain, unspecified: Secondary | ICD-10-CM | POA: Diagnosis not present

## 2021-11-05 ENCOUNTER — Encounter (INDEPENDENT_AMBULATORY_CARE_PROVIDER_SITE_OTHER): Payer: Self-pay | Admitting: Family Medicine

## 2021-11-05 ENCOUNTER — Ambulatory Visit (INDEPENDENT_AMBULATORY_CARE_PROVIDER_SITE_OTHER): Payer: BC Managed Care – PPO | Admitting: Family Medicine

## 2021-11-05 ENCOUNTER — Other Ambulatory Visit: Payer: Self-pay

## 2021-11-05 VITALS — BP 128/78 | HR 61 | Temp 97.8°F | Ht 69.0 in | Wt 204.0 lb

## 2021-11-05 DIAGNOSIS — E7849 Other hyperlipidemia: Secondary | ICD-10-CM

## 2021-11-05 DIAGNOSIS — Z23 Encounter for immunization: Secondary | ICD-10-CM | POA: Diagnosis not present

## 2021-11-05 DIAGNOSIS — E559 Vitamin D deficiency, unspecified: Secondary | ICD-10-CM | POA: Diagnosis not present

## 2021-11-05 DIAGNOSIS — E6609 Other obesity due to excess calories: Secondary | ICD-10-CM | POA: Diagnosis not present

## 2021-11-05 DIAGNOSIS — R7303 Prediabetes: Secondary | ICD-10-CM | POA: Diagnosis not present

## 2021-11-05 DIAGNOSIS — K469 Unspecified abdominal hernia without obstruction or gangrene: Secondary | ICD-10-CM | POA: Diagnosis not present

## 2021-11-05 DIAGNOSIS — E669 Obesity, unspecified: Secondary | ICD-10-CM

## 2021-11-05 DIAGNOSIS — Z6830 Body mass index (BMI) 30.0-30.9, adult: Secondary | ICD-10-CM

## 2021-11-05 DIAGNOSIS — Z9189 Other specified personal risk factors, not elsewhere classified: Secondary | ICD-10-CM

## 2021-11-05 DIAGNOSIS — E66811 Obesity, class 1: Secondary | ICD-10-CM

## 2021-11-05 DIAGNOSIS — Z6829 Body mass index (BMI) 29.0-29.9, adult: Secondary | ICD-10-CM | POA: Diagnosis not present

## 2021-11-05 MED ORDER — VITAMIN D (ERGOCALCIFEROL) 1.25 MG (50000 UNIT) PO CAPS: 50000.0000 [IU] | ORAL_CAPSULE | ORAL | 0 refills | Status: DC

## 2021-11-06 NOTE — Progress Notes (Signed)
Chief Complaint:   OBESITY [name] is here to discuss his progress with his obesity treatment plan along with follow-up of his obesity related diagnoses. [name] is on the Category 1 Plan and states he is following his eating plan approximately 90% of the time. [name] states he is not currently exercising.  [name]'s visit was #: 6 Starting weight: 208 lbs Starting date: 08/12/2021 [name]'s weight: 204 lbs [name]'s date: 11/05/2021 Total lbs lost to date: 4 Total lbs lost since last in-office visit: 3  Interim History: [name] denies issues with the meal plan. He is worried that he will lose muscle mass, and he desires to start lifting weights.  Subjective:   1. Other hyperlipidemia [name] has hyperlipidemia and he is taking Crestor qhs. He has been trying to improve his cholesterol levels with intensive lifestyle modification including a low saturated fat diet, exercise and weight loss. He denies any chest pain, claudication or myalgias.  2. Pre-diabetes [name] has a diagnosis of pre-diabetes based on his elevated HgA1c and was informed this puts him at greater risk of developing diabetes. He continues to work on diet and exercise to decrease his risk of diabetes. He denies nausea or hypoglycemia.  3. Vitamin D deficiency [name] is currently taking prescription vitamin D 50,000 IU each week. He denies nausea, vomiting or muscle weakness.  4. At risk for impaired metabolic function [name] is at increased risk for impaired metabolic function due to pre-diabetes.  Assessment/Plan:   Orders Placed This Encounter  Procedures   VITAMIN D 25 Hydroxy (Vit-D Deficiency, Fractures)   Comprehensive metabolic panel   Lipid Panel With LDL/HDL Ratio   Hemoglobin A1c   Insulin, random    Medications Discontinued During This Encounter  Medication Reason   Vitamin D, Ergocalciferol, (DRISDOL) 1.25 MG (50000 UNIT) CAPS capsule Reorder     Meds ordered this encounter  Medications   Vitamin D,  Ergocalciferol, (DRISDOL) 1.25 MG (50000 UNIT) CAPS capsule    Sig: Take 1 capsule (50,000 Units total) by mouth every 7 (seven) days.    Dispense:  4 capsule    Refill:  0     1. Other hyperlipidemia Cardiovascular risk and specific lipid/LDL goals reviewed. We discussed several lifestyle modifications [name]. We will recheck labs at his next office visit. [name] will continue to work on diet, exercise and weight loss efforts. Orders and follow up as documented in patient record.   Counseling Intensive lifestyle modifications are the first line treatment for this issue. Dietary changes: Increase soluble fiber. Decrease simple carbohydrates. Exercise changes: Moderate to vigorous-intensity aerobic activity 150 minutes per week if tolerated. Lipid-lowering medications: see documented in medical record.  - Comprehensive metabolic panel - Lipid Panel With LDL/HDL Ratio  2. Pre-diabetes [name] will continue to work on weight loss, exercise, and decreasing simple carbohydrates to help decrease the risk of diabetes. We will recheck labs at his next office visit.  - Hemoglobin A1c - Insulin, random  3. Vitamin D deficiency We will refill prescription Vitamin D for 1 month, and we will recheck labs at his next office visit. [name] will follow-up for routine testing of Vitamin D, at least 2-3 times per year to avoid over-replacement.  - Vitamin D, Ergocalciferol, (DRISDOL) 1.25 MG (50000 UNIT) CAPS capsule; Take 1 capsule (50,000 Units total) by mouth every 7 (seven) days.  Dispense: 4 capsule; Refill: 0 - VITAMIN D 25 Hydroxy (Vit-D Deficiency, Fractures)  4. At risk for impaired metabolic function [name] was given approximately 9  minutes of impaired  metabolic function prevention counseling [name]. We discussed intensive lifestyle modifications [name] with an emphasis on specific nutrition and exercise instructions and strategies.   Repetitive spaced learning was employed [name] to elicit superior  memory formation and behavioral change.  5. Obesity with current BMI of 30.1 [name] is currently in the action stage of change. As such, his goal is to continue with weight loss efforts. He has agreed to the Category 1 Plan.   [name]. She is to come fasting to her next office visit.  Exercise goals: Start strength training every other day for 30 minutes.  Behavioral modification strategies: Douglas eating strategies .  [name] has agreed to follow-up with our clinic in 3 to 4 weeks. He was informed of the importance of frequent follow-up visits to maximize his success with intensive lifestyle modifications for his multiple health conditions.   Objective:   Blood pressure 128/78, pulse 61, temperature 97.8 F (36.6 C), height 5\' 9"  (1.753 m), weight 204 lb (92.5 kg), SpO2 99 %. Body mass index is 30.13 kg/m.  General: Cooperative, alert, well developed, in no acute distress. HEENT: Conjunctivae and lids unremarkable. Cardiovascular: Regular rhythm.  Lungs: Normal work of breathing. Neurologic: No focal deficits.   Lab Results  Component Value Date   CREATININE 1.04 08/12/2021   BUN 12 08/12/2021   NA 137 08/12/2021   K 4.0 08/12/2021   CL 98 08/12/2021   CO2 22 08/12/2021   Lab Results  Component Value Date   ALT 18 08/12/2021   AST 17 08/12/2021   ALKPHOS 100 08/12/2021   BILITOT 0.6 08/12/2021   Lab Results  Component Value Date   HGBA1C 5.9 (H) 08/12/2021   Lab Results  Component Value Date   INSULIN 12.0 08/12/2021   Lab Results  Component Value Date   TSH 1.510 08/12/2021   Lab Results  Component Value Date   CHOL 230 (H) 08/12/2021   HDL 32 (L) 08/12/2021   LDLCALC 173 (H) 08/12/2021   TRIG 135 08/12/2021   Lab Results  Component Value Date   VD25OH 41.6 08/12/2021   Lab Results  Component Value Date   WBC 7.0 08/12/2021   HGB 15.4 08/12/2021   HCT 46.2 08/12/2021   MCV 89 08/12/2021   PLT 225  08/12/2021   No results found for: IRON, TIBC, FERRITIN  Attestation Statements:   Reviewed by clinician on day of visit: allergies, medications, problem list, medical history, surgical history, family history, social history, and previous encounter notes.   Wilhemena Durie, am acting as transcriptionist for [employer], DO.  I have reviewed the above documentation for accuracy and completeness, and I agree with the above. Marjory Sneddon, D.O.  The [place] was signed into law in 2016 which includes the topic of electronic health records.  This provides immediate access to information in MyChart.  This includes consultation notes, operative notes, office notes, lab results and pathology reports.  If you have any questions about what you read please let us know at your next visit so we can discuss your concerns and take corrective action if need be.  We are right here with you.

## 2021-11-27 ENCOUNTER — Encounter (INDEPENDENT_AMBULATORY_CARE_PROVIDER_SITE_OTHER): Payer: Self-pay | Admitting: Family Medicine

## 2021-11-27 ENCOUNTER — Other Ambulatory Visit: Payer: Self-pay

## 2021-11-27 ENCOUNTER — Ambulatory Visit (INDEPENDENT_AMBULATORY_CARE_PROVIDER_SITE_OTHER): Payer: BC Managed Care – PPO | Admitting: Family Medicine

## 2021-11-27 VITALS — BP 130/83 | HR 76 | Temp 98.5°F | Ht 69.0 in | Wt 205.0 lb

## 2021-11-27 DIAGNOSIS — E669 Obesity, unspecified: Secondary | ICD-10-CM

## 2021-11-27 DIAGNOSIS — Z6830 Body mass index (BMI) 30.0-30.9, adult: Secondary | ICD-10-CM

## 2021-11-27 DIAGNOSIS — Z9189 Other specified personal risk factors, not elsewhere classified: Secondary | ICD-10-CM | POA: Diagnosis not present

## 2021-11-27 DIAGNOSIS — E559 Vitamin D deficiency, unspecified: Secondary | ICD-10-CM

## 2021-11-27 DIAGNOSIS — R7303 Prediabetes: Secondary | ICD-10-CM | POA: Diagnosis not present

## 2021-11-27 DIAGNOSIS — E7849 Other hyperlipidemia: Secondary | ICD-10-CM

## 2021-11-27 MED ORDER — VITAMIN D (ERGOCALCIFEROL) 1.25 MG (50000 UNIT) PO CAPS: 50000.0000 [IU] | ORAL_CAPSULE | ORAL | 0 refills | Status: DC

## 2021-11-28 LAB — VITAMIN D 25 HYDROXY (VIT D DEFICIENCY, FRACTURES): Vit D, 25-Hydroxy: 49.5 ng/mL (ref 30.0–100.0)

## 2021-11-28 LAB — COMPREHENSIVE METABOLIC PANEL
ALT: 25 IU/L (ref 0–44)
AST: 18 IU/L (ref 0–40)
Albumin/Globulin Ratio: 2.1 (ref 1.2–2.2)
Albumin: 4.9 g/dL (ref 3.8–4.9)
Alkaline Phosphatase: 118 IU/L (ref 44–121)
BUN/Creatinine Ratio: 18 (ref 10–24)
BUN: 17 mg/dL (ref 8–27)
Bilirubin Total: 0.6 mg/dL (ref 0.0–1.2)
CO2: 25 mmol/L (ref 20–29)
Calcium: 9.9 mg/dL (ref 8.6–10.2)
Chloride: 103 mmol/L (ref 96–106)
Creatinine, Ser: 0.94 mg/dL (ref 0.76–1.27)
Globulin, Total: 2.3 g/dL (ref 1.5–4.5)
Glucose: 109 mg/dL — ABNORMAL HIGH (ref 70–99)
Potassium: 4.9 mmol/L (ref 3.5–5.2)
Sodium: 142 mmol/L (ref 134–144)
Total Protein: 7.2 g/dL (ref 6.0–8.5)
eGFR: 93 mL/min/{1.73_m2} (ref >59)

## 2021-11-28 LAB — INSULIN, RANDOM: INSULIN: 23 u[IU]/mL (ref 2.6–24.9)

## 2021-11-28 LAB — LIPID PANEL WITH LDL/HDL RATIO
Cholesterol, Total: 227 mg/dL — ABNORMAL HIGH (ref 100–199)
HDL: 44 mg/dL (ref >39)
LDL Chol Calc (NIH): 168 mg/dL — ABNORMAL HIGH (ref 0–99)
LDL/HDL Ratio: 3.8 ratio — ABNORMAL HIGH (ref 0.0–3.6)
Triglycerides: 83 mg/dL (ref 0–149)
VLDL Cholesterol Cal: 15 mg/dL (ref 5–40)

## 2021-11-28 LAB — HEMOGLOBIN A1C
Est. average glucose Bld gHb Est-mCnc: 117 mg/dL
Hgb A1c MFr Bld: 5.7 % — ABNORMAL HIGH (ref 4.8–5.6)

## 2021-12-01 NOTE — Progress Notes (Signed)
Chief Complaint:   OBESITY [name] is here to discuss his progress with his obesity treatment plan along with follow-up of his obesity related diagnoses. [name] is on the Category 1 Plan and states he is following his eating plan approximately 90% of the time. [name] states he is walking and doing abdominal exercise for 30 minutes 3-4 times per week.  Today's visit was #: 7 Starting weight: 208 lbs Starting date: 08/12/2021 Today's weight: 205 lbs Today's date: 11/27/2021 Total lbs lost to date: 3 Total lbs lost since last in-office visit: 0  Interim History: [name] did overindulge a little over the holidays. He loves his candy. He has no issues with the meal plan, and he denies hunger or cravings.  Subjective:   1. Vitamin D deficiency [name] is currently taking prescription vitamin D 50,000 IU each week. He denies nausea, vomiting or muscle weakness.  2. Other hyperlipidemia [name] did start on his Crestor the first follow up office visit with [place]. He is tolerating medication(s) well without side effects. Medication compliance is good and patient appears to be taking it as prescribed. Denies additional concerns regarding this condition.   3. At risk for heart disease [name] is at a higher than average risk for cardiovascular disease due to obesity and hyperlipidemia.   Assessment/Plan:  No orders of the defined types were placed in this encounter.   Medications Discontinued During This Encounter  Medication Reason   Vitamin D, Cholecalciferol, 10 MCG (400 UNIT) CAPS    Vitamin D, Ergocalciferol, (DRISDOL) 1.25 MG (50000 UNIT) CAPS capsule Reorder     Meds ordered this encounter  Medications   Vitamin D, Ergocalciferol, (DRISDOL) 1.25 MG (50000 UNIT) CAPS capsule    Sig: Take 1 capsule (50,000 Units total) by mouth every 7 (seven) days.    Dispense:  4 capsule    Refill:  0     1. Vitamin D deficiency [name] will continue prescription Vitamin D 50,000 IU every week, and we will  refill for 1 month. He will follow-up for routine testing of Vitamin D, at least 2-3 times per year to avoid over-replacement.  - Vitamin D, Ergocalciferol, (DRISDOL) 1.25 MG (50000 UNIT) CAPS capsule; Take 1 capsule (50,000 Units total) by mouth every 7 (seven) days.  Dispense: 4 capsule; Refill: 0  2. Other hyperlipidemia Cardiovascular risk and specific lipid/LDL goals reviewed. We discussed several lifestyle modifications today. [name] will continue Crestor qhs, and will continue diet, exercise, and weight loss efforts. Orders and follow up as documented in patient record.   Counseling Intensive lifestyle modifications are the first line treatment for this issue. Dietary changes: Increase soluble fiber. Decrease simple carbohydrates. Exercise changes: Moderate to vigorous-intensity aerobic activity 150 minutes per week if tolerated. Lipid-lowering medications: see documented in medical record.  3. At risk for heart disease [name] was given approximately 9 minutes of coronary artery disease prevention counseling today. He is 61 y.o. male and has risk factors for heart disease including obesity. We discussed intensive lifestyle modifications today with an emphasis on specific weight loss instructions and strategies.   Repetitive spaced learning was employed today to elicit superior memory formation and behavioral change.   4. Obesity with current BMI of 30.4 [name] is currently in the action stage of change. As such, his goal is to continue with weight loss efforts. He has agreed to the Category 1 Plan.   [name] will get back to his eating plan an increase exercise.   Exercise goals:  For substantial health benefits, adults should do at least 150 minutes (2 hours and 30 minutes) a week of moderate-intensity, or 75 minutes (1 hour and 15 minutes) a week of vigorous-intensity aerobic physical activity, or an equivalent combination of moderate- and vigorous-intensity aerobic activity. Aerobic activity  should be performed in episodes of at least 10 minutes, and preferably, it should be spread throughout the week.  Behavioral modification strategies: meal planning and cooking strategies and planning for success.  [name] has agreed to follow-up with our clinic in 2 weeks. He was informed of the importance of frequent follow-up visits to maximize his success with intensive lifestyle modifications for his multiple health conditions.   [name] was informed we would discuss his lab results at his next visit unless there is a critical issue that needs to be addressed sooner. [name] agreed to keep his next visit at the agreed upon time to discuss these results.  Objective:   Blood pressure 130/83, pulse 76, temperature 98.5 F (36.9 C), height 5\' 9"  (1.753 m), weight 205 lb (93 kg), SpO2 97 %. Body mass index is 30.27 kg/m.  General: Cooperative, alert, well developed, in no acute distress. HEENT: Conjunctivae and lids unremarkable. Cardiovascular: Regular rhythm.  Lungs: Normal work of breathing. Neurologic: No focal deficits.   Lab Results  Component Value Date   CREATININE 0.94 11/27/2021   BUN 17 11/27/2021   NA 142 11/27/2021   K 4.9 11/27/2021   CL 103 11/27/2021   CO2 25 11/27/2021   Lab Results  Component Value Date   ALT 25 11/27/2021   AST 18 11/27/2021   ALKPHOS 118 11/27/2021   BILITOT 0.6 11/27/2021   Lab Results  Component Value Date   HGBA1C 5.7 (H) 11/27/2021   HGBA1C 5.9 (H) 08/12/2021   Lab Results  Component Value Date   INSULIN 23.0 11/27/2021   INSULIN 12.0 08/12/2021   Lab Results  Component Value Date   TSH 1.510 08/12/2021   Lab Results  Component Value Date   CHOL 227 (H) 11/27/2021   HDL 44 11/27/2021   LDLCALC 168 (H) 11/27/2021   TRIG 83 11/27/2021   Lab Results  Component Value Date   VD25OH 49.5 11/27/2021   VD25OH 41.6 08/12/2021   Lab Results  Component Value Date   WBC 7.0 08/12/2021   HGB 15.4 08/12/2021   HCT 46.2 08/12/2021    MCV 89 08/12/2021   PLT 225 08/12/2021   No results found for: IRON, TIBC, FERRITIN  Attestation Statements:   Reviewed by clinician on day of visit: allergies, medications, problem list, medical history, surgical history, family history, social history, and previous encounter notes.   Wilhemena Durie, am acting as transcriptionist for [employer], DO.  I have reviewed the above documentation for accuracy and completeness, and I agree with the above. Marjory Sneddon, D.O.  The [place] was signed into law in 2016 which includes the topic of electronic health records.  This provides immediate access to information in MyChart.  This includes consultation notes, operative notes, office notes, lab results and pathology reports.  If you have any questions about what you read please let us know at your next visit so we can discuss your concerns and take corrective action if need be.  We are right here with you.

## 2021-12-11 ENCOUNTER — Ambulatory Visit (INDEPENDENT_AMBULATORY_CARE_PROVIDER_SITE_OTHER): Payer: BC Managed Care – PPO | Admitting: Family Medicine

## 2021-12-11 ENCOUNTER — Other Ambulatory Visit: Payer: Self-pay

## 2021-12-11 ENCOUNTER — Encounter (INDEPENDENT_AMBULATORY_CARE_PROVIDER_SITE_OTHER): Payer: Self-pay | Admitting: Family Medicine

## 2021-12-11 VITALS — BP 130/75 | HR 70 | Temp 98.0°F | Ht 69.0 in | Wt 206.0 lb

## 2021-12-11 DIAGNOSIS — R7303 Prediabetes: Secondary | ICD-10-CM

## 2021-12-11 DIAGNOSIS — E559 Vitamin D deficiency, unspecified: Secondary | ICD-10-CM | POA: Diagnosis not present

## 2021-12-11 DIAGNOSIS — Z9189 Other specified personal risk factors, not elsewhere classified: Secondary | ICD-10-CM

## 2021-12-11 DIAGNOSIS — E669 Obesity, unspecified: Secondary | ICD-10-CM | POA: Diagnosis not present

## 2021-12-11 DIAGNOSIS — E7849 Other hyperlipidemia: Secondary | ICD-10-CM | POA: Diagnosis not present

## 2021-12-11 DIAGNOSIS — Z6830 Body mass index (BMI) 30.0-30.9, adult: Secondary | ICD-10-CM

## 2021-12-11 MED ORDER — VITAMIN D (ERGOCALCIFEROL) 1.25 MG (50000 UNIT) PO CAPS: 50000.0000 [IU] | ORAL_CAPSULE | ORAL | 0 refills | Status: DC

## 2021-12-11 MED ORDER — ROSUVASTATIN CALCIUM 20 MG PO TABS: 20.0000 mg | ORAL_TABLET | Freq: Every evening | ORAL | 0 refills | Status: DC

## 2021-12-11 NOTE — Patient Instructions (Signed)
The 10-year ASCVD risk score (Arnett DK, et al., 2019) is: 10.9%   Values used to calculate the score:     Age: 61 years     Sex: Male     Is Non-Hispanic African American: No     Diabetic: No     Tobacco smoker: No     Systolic Blood Pressure: 060 mmHg     Is BP treated: No     HDL Cholesterol: 44 mg/dL     Total Cholesterol: 227 mg/dL

## 2021-12-18 NOTE — Progress Notes (Signed)
Chief Complaint:   OBESITY [name] is here to discuss his progress with his obesity treatment plan along with follow-up of his obesity related diagnoses. [name] is on the Category 1 Plan and states he is following his eating plan approximately 98% of the time. [name] states he is not currently exercising..  Today's visit was #: 8 Starting weight: 208 lbs Starting date: 08/12/2021 Today's weight: 206 lbs Today's date: 12/11/2021 Total lbs lost to date: 2 Total lbs lost since last in-office visit: 0  Interim History: [name] is here for a follow up office visit. We reviewed his meal plan and questions were answered. Patient's food recall appears to be accurate and consistent with what is on plan when he is following it. When eating on plan, his hunger and cravings are well controlled. He is getting a little bored with eating some things. Also, he is here to review labs that were drawn at his last office visit.   Subjective:   1. Pre-diabetes  Cy denies hunger or cravings. His A1c has improved and his fasting insulin has worsened. I discussed labs with the patient today.  2. Vitamin D deficiency [name]'s Vit D level is at goal at 49.5 recently. I discussed labs with the patient today.  3. Other hyperlipidemia [name]'s labs were compared to 3-4 months ago. His cholesterol panel has improved, but is still not at goal. He is taking his medications as written and rarely ever skips doses. He is tolerating it well. I discussed labs with the patient today.  4. At risk for heart disease [name] is at a higher than average risk for cardiovascular disease due to poorly controlled cholesterol and inactivity.  Assessment/Plan:  No orders of the defined types were placed in this encounter.   Medications Discontinued During This Encounter  Medication Reason   Vitamin D, Ergocalciferol, (DRISDOL) 1.25 MG (50000 UNIT) CAPS capsule Reorder   rosuvastatin (CRESTOR) 10 MG tablet Reorder     Meds  ordered this encounter  Medications   Vitamin D, Ergocalciferol, (DRISDOL) 1.25 MG (50000 UNIT) CAPS capsule    Sig: Take 1 capsule (50,000 Units total) by mouth every 7 (seven) days.    Dispense:  4 capsule    Refill:  0   rosuvastatin (CRESTOR) 20 MG tablet    Sig: Take 1 tablet (20 mg total) by mouth at bedtime.    Dispense:  30 tablet    Refill:  0     1. Pre-diabetes Fallon declines medications at this time. He will continue to work on weight loss, exercise, and decreasing simple carbohydrates to help decrease the risk of diabetes.   2. Vitamin D deficiency We will refill prescription Vitamin D at the same dose. [name] will follow-up for routine testing of Vitamin D, at least 2-3 times per year to avoid over-replacement.  - Vitamin D, Ergocalciferol, (DRISDOL) 1.25 MG (50000 UNIT) CAPS capsule; Take 1 capsule (50,000 Units total) by mouth every 7 (seven) days.  Dispense: 4 capsule; Refill: 0  3. Other hyperlipidemia Cardiovascular risk and specific lipid/LDL goals reviewed. We discussed several lifestyle modifications today. [name] agreed to increase Crestor from 10 mg to 20 mg qhs, with no refills. Counseling was done. He will continue his prudent nutritional plan, exercise and weight loss efforts. Orders and follow up as documented in patient record.   Counseling Intensive lifestyle modifications are the first line treatment for this issue. Dietary changes: Increase soluble fiber. Decrease simple carbohydrates. Exercise changes: Moderate to  vigorous-intensity aerobic activity 150 minutes per week if tolerated. Lipid-lowering medications: see documented in medical record.  - rosuvastatin (CRESTOR) 20 MG tablet; Take 1 tablet (20 mg total) by mouth at bedtime.  Dispense: 30 tablet; Refill: 0  4. At risk for heart disease [name] was given approximately 10 minutes of coronary artery disease prevention counseling today. He is 61 y.o. male and has risk factors for heart disease including  obesity. We discussed intensive lifestyle modifications today with an emphasis on specific weight loss instructions and strategies.   Repetitive spaced learning was employed today to elicit superior memory formation and behavioral change.  5. Obesity with current BMI of 30.4 [name] is currently in the action stage of change. As such, his goal is to continue with weight loss efforts. He has agreed to change to keeping a food journal and adhering to recommended goals of 1100-1200 calories and 80+ grams of protein daily.   [name] will journal and bring in his log of protein and calories to his next office visit.  Exercise goals: All adults should avoid inactivity. Some physical activity is better than none, and adults who participate in any amount of physical activity gain some health benefits.  Behavioral modification strategies: avoiding temptations and planning for success.  [name] has agreed to follow-up with our clinic in 3 weeks. He was informed of the importance of frequent follow-up visits to maximize his success with intensive lifestyle modifications for his multiple health conditions.   Objective:   Blood pressure 130/75, pulse 70, temperature 98 F (36.7 C), height 5\' 9"  (1.753 m), weight 206 lb (93.4 kg), SpO2 98 %. Body mass index is 30.42 kg/m.  General: Cooperative, alert, well developed, in no acute distress. HEENT: Conjunctivae and lids unremarkable. Cardiovascular: Regular rhythm.  Lungs: Normal work of breathing. Neurologic: No focal deficits.   Lab Results  Component Value Date   CREATININE 0.94 11/27/2021   BUN 17 11/27/2021   NA 142 11/27/2021   K 4.9 11/27/2021   CL 103 11/27/2021   CO2 25 11/27/2021   Lab Results  Component Value Date   ALT 25 11/27/2021   AST 18 11/27/2021   ALKPHOS 118 11/27/2021   BILITOT 0.6 11/27/2021   Lab Results  Component Value Date   HGBA1C 5.7 (H) 11/27/2021   HGBA1C 5.9 (H) 08/12/2021   Lab Results  Component Value Date    INSULIN 23.0 11/27/2021   INSULIN 12.0 08/12/2021   Lab Results  Component Value Date   TSH 1.510 08/12/2021   Lab Results  Component Value Date   CHOL 227 (H) 11/27/2021   HDL 44 11/27/2021   LDLCALC 168 (H) 11/27/2021   TRIG 83 11/27/2021   Lab Results  Component Value Date   VD25OH 49.5 11/27/2021   VD25OH 41.6 08/12/2021   Lab Results  Component Value Date   WBC 7.0 08/12/2021   HGB 15.4 08/12/2021   HCT 46.2 08/12/2021   MCV 89 08/12/2021   PLT 225 08/12/2021   No results found for: IRON, TIBC, FERRITIN  Attestation Statements:   Reviewed by clinician on day of visit: allergies, medications, problem list, medical history, surgical history, family history, social history, and previous encounter notes.   Wilhemena Durie, am acting as transcriptionist for [employer], DO.  I have reviewed the above documentation for accuracy and completeness, and I agree with the above. Marjory Sneddon, D.O.  The [place] was signed into law in 2016 which includes the  topic of electronic health records.  This provides immediate access to information in MyChart.  This includes consultation notes, operative notes, office notes, lab results and pathology reports.  If you have any questions about what you read please let us know at your next visit so we can discuss your concerns and take corrective action if need be.  We are right here with you.

## 2021-12-26 DIAGNOSIS — M5416 Radiculopathy, lumbar region: Secondary | ICD-10-CM | POA: Diagnosis not present

## 2021-12-26 DIAGNOSIS — M5136 Other intervertebral disc degeneration, lumbar region: Secondary | ICD-10-CM | POA: Diagnosis not present

## 2021-12-26 DIAGNOSIS — K432 Incisional hernia without obstruction or gangrene: Secondary | ICD-10-CM | POA: Diagnosis not present

## 2021-12-26 DIAGNOSIS — M4802 Spinal stenosis, cervical region: Secondary | ICD-10-CM | POA: Diagnosis not present

## 2021-12-29 ENCOUNTER — Ambulatory Visit (INDEPENDENT_AMBULATORY_CARE_PROVIDER_SITE_OTHER): Payer: BC Managed Care – PPO | Admitting: Family Medicine

## 2021-12-29 ENCOUNTER — Other Ambulatory Visit: Payer: Self-pay

## 2021-12-29 ENCOUNTER — Encounter (INDEPENDENT_AMBULATORY_CARE_PROVIDER_SITE_OTHER): Payer: Self-pay | Admitting: Family Medicine

## 2021-12-29 VITALS — BP 132/78 | HR 66 | Temp 98.3°F | Ht 69.0 in | Wt 206.0 lb

## 2021-12-29 DIAGNOSIS — E7849 Other hyperlipidemia: Secondary | ICD-10-CM

## 2021-12-29 DIAGNOSIS — Z9189 Other specified personal risk factors, not elsewhere classified: Secondary | ICD-10-CM

## 2021-12-29 DIAGNOSIS — Z6830 Body mass index (BMI) 30.0-30.9, adult: Secondary | ICD-10-CM

## 2021-12-29 DIAGNOSIS — E669 Obesity, unspecified: Secondary | ICD-10-CM | POA: Diagnosis not present

## 2021-12-29 DIAGNOSIS — E66811 Obesity, class 1: Secondary | ICD-10-CM

## 2021-12-29 DIAGNOSIS — E559 Vitamin D deficiency, unspecified: Secondary | ICD-10-CM | POA: Diagnosis not present

## 2021-12-29 MED ORDER — VITAMIN D (ERGOCALCIFEROL) 1.25 MG (50000 UNIT) PO CAPS: 50000.0000 [IU] | ORAL_CAPSULE | ORAL | 0 refills | Status: DC

## 2021-12-29 MED ORDER — ROSUVASTATIN CALCIUM 20 MG PO TABS: 20.0000 mg | ORAL_TABLET | Freq: Every evening | ORAL | 0 refills | Status: DC

## 2021-12-30 NOTE — Progress Notes (Signed)
Chief Complaint:   OBESITY [name] is here to discuss his progress with his obesity treatment plan along with follow-up of his obesity related diagnoses. [name] is on keeping a food journal and adhering to recommended goals of 1100-1200 calories and 80+ grams of protein daily and states he is following his eating plan approximately 50% of the time. [name] states he is doing abdominal exercise for 20 minutes 3-4 times per week.  Today's visit was #: 9 Starting weight: 208 lbs Starting date: 08/12/2021 Today's weight: 206 lbs Today's date: 12/29/2021 Total lbs lost to date: 2 Total lbs lost since last in-office visit: 0  Interim History: [name]'s mother passed away a couple of weeks ago. He traveled to [place] a lot and he ate on the road more. Thus he wasn't able to journal during that time. He is more aware of what he is eating.  Subjective:   1. Other hyperlipidemia We increased Chase's Crestor dose at his last office visit, and he is tolerating it well.  2. Vitamin D deficiency [name] is currently taking prescription vitamin D 50,000 IU each week. He denies nausea, vomiting or muscle weakness.  3. At risk for impaired metabolic function [name] is at increased risk for impaired metabolic function due to current nutrition and muscle mass.   Assessment/Plan:  No orders of the defined types were placed in this encounter.   Medications Discontinued During This Encounter  Medication Reason   Vitamin D, Ergocalciferol, (DRISDOL) 1.25 MG (50000 UNIT) CAPS capsule Reorder   rosuvastatin (CRESTOR) 20 MG tablet Reorder     Meds ordered this encounter  Medications   rosuvastatin (CRESTOR) 20 MG tablet    Sig: Take 1 tablet (20 mg total) by mouth at bedtime.    Dispense:  30 tablet    Refill:  0   Vitamin D, Ergocalciferol, (DRISDOL) 1.25 MG (50000 UNIT) CAPS capsule    Sig: Take 1 capsule (50,000 Units total) by mouth every 7 (seven) days.    Dispense:  4 capsule    Refill:  0      1. Other hyperlipidemia Cardiovascular risk and specific lipid/LDL goals reviewed.  We discussed several lifestyle modifications today. We will refill Crestor for 1 month. Albeiro will continue to work on diet, exercise and weight loss efforts. Orders and follow up as documented in patient record.   Counseling Intensive lifestyle modifications are the first line treatment for this issue. Dietary changes: Increase soluble fiber. Decrease simple carbohydrates. Exercise changes: Moderate to vigorous-intensity aerobic activity 150 minutes per week if tolerated. Lipid-lowering medications: see documented in medical record.  - rosuvastatin (CRESTOR) 20 MG tablet; Take 1 tablet (20 mg total) by mouth at bedtime.  Dispense: 30 tablet; Refill: 0  2. Vitamin D deficiency We will refill prescription Vitamin D for 1 month. [name] will follow-up for routine testing of Vitamin D, at least 2-3 times per year to avoid over-replacement.  - Vitamin D, Ergocalciferol, (DRISDOL) 1.25 MG (50000 UNIT) CAPS capsule; Take 1 capsule (50,000 Units total) by mouth every 7 (seven) days.  Dispense: 4 capsule; Refill: 0  3. At risk for impaired metabolic function [name] was given approximately 9 minutes of impaired  metabolic function prevention counseling today. We discussed intensive lifestyle modifications today with an emphasis on specific nutrition and exercise instructions and strategies.   Repetitive spaced learning was employed today to elicit superior memory formation and behavioral change.  4. Obesity with current BMI of 30.5 [name] is currently in the  action stage of change. As such, his goal is to continue with weight loss efforts. He has agreed to keeping a food journal and adhering to recommended goals of 1100-1200 calories and 80+ grams of protein daily.   Exercise goals: All adults should avoid inactivity. Some physical activity is better than none, and adults who participate in any amount of physical activity  gain some health benefits.  Behavioral modification strategies: increasing lean protein intake, decreasing simple carbohydrates, and planning for success.  [name] has agreed to follow-up with our clinic in 3 to 4 weeks. He was informed of the importance of frequent follow-up visits to maximize his success with intensive lifestyle modifications for his multiple health conditions.   Objective:   Blood pressure 132/78, pulse 66, temperature 98.3 F (36.8 C), height 5\' 9"  (1.753 m), weight 206 lb (93.4 kg), SpO2 98 %. Body mass index is 30.42 kg/m.  General: Cooperative, alert, well developed, in no acute distress. HEENT: Conjunctivae and lids unremarkable. Cardiovascular: Regular rhythm.  Lungs: Normal work of breathing. Neurologic: No focal deficits.   Lab Results  Component Value Date   CREATININE 0.94 11/27/2021   BUN 17 11/27/2021   NA 142 11/27/2021   K 4.9 11/27/2021   CL 103 11/27/2021   CO2 25 11/27/2021   Lab Results  Component Value Date   ALT 25 11/27/2021   AST 18 11/27/2021   ALKPHOS 118 11/27/2021   BILITOT 0.6 11/27/2021   Lab Results  Component Value Date   HGBA1C 5.7 (H) 11/27/2021   HGBA1C 5.9 (H) 08/12/2021   Lab Results  Component Value Date   INSULIN 23.0 11/27/2021   INSULIN 12.0 08/12/2021   Lab Results  Component Value Date   TSH 1.510 08/12/2021   Lab Results  Component Value Date   CHOL 227 (H) 11/27/2021   HDL 44 11/27/2021   LDLCALC 168 (H) 11/27/2021   TRIG 83 11/27/2021   Lab Results  Component Value Date   VD25OH 49.5 11/27/2021   VD25OH 41.6 08/12/2021   Lab Results  Component Value Date   WBC 7.0 08/12/2021   HGB 15.4 08/12/2021   HCT 46.2 08/12/2021   MCV 89 08/12/2021   PLT 225 08/12/2021   No results found for: IRON, TIBC, FERRITIN  Attestation Statements:   Reviewed by clinician on day of visit: allergies, medications, problem list, medical history, surgical history, family history, social history, and previous  encounter notes.  Wilhemena Durie, am acting as transcriptionist for [employer], DO.  I have reviewed the above documentation for accuracy and completeness, and I agree with the above. Marjory Sneddon, D.O.  The [place] was signed into law in 2016 which includes the topic of electronic health records.  This provides immediate access to information in MyChart.  This includes consultation notes, operative notes, office notes, lab results and pathology reports.  If you have any questions about what you read please let us know at your next visit so we can discuss your concerns and take corrective action if need be.  We are right here with you.

## 2021-12-31 ENCOUNTER — Other Ambulatory Visit: Payer: Self-pay | Admitting: General Surgery

## 2021-12-31 DIAGNOSIS — M5136 Other intervertebral disc degeneration, lumbar region: Secondary | ICD-10-CM

## 2021-12-31 DIAGNOSIS — K432 Incisional hernia without obstruction or gangrene: Secondary | ICD-10-CM

## 2022-01-11 ENCOUNTER — Other Ambulatory Visit: Payer: Self-pay

## 2022-01-11 ENCOUNTER — Telehealth: Payer: Self-pay | Admitting: Emergency Medicine

## 2022-01-11 ENCOUNTER — Ambulatory Visit
Admission: EM | Admit: 2022-01-11 | Discharge: 2022-01-11 | Disposition: A | Discharge status: Home / Self Care | Payer: BC Managed Care – PPO | Attending: Urgent Care | Admitting: Urgent Care

## 2022-01-11 DIAGNOSIS — J069 Acute upper respiratory infection, unspecified: Secondary | ICD-10-CM

## 2022-01-11 DIAGNOSIS — R0602 Shortness of breath: Secondary | ICD-10-CM

## 2022-01-11 DIAGNOSIS — Z87898 Personal history of other specified conditions: Secondary | ICD-10-CM

## 2022-01-11 DIAGNOSIS — R0981 Nasal congestion: Secondary | ICD-10-CM

## 2022-01-11 DIAGNOSIS — Z9889 Other specified postprocedural states: Secondary | ICD-10-CM

## 2022-01-11 DIAGNOSIS — R058 Other specified cough: Secondary | ICD-10-CM

## 2022-01-11 MED ORDER — LEVOCETIRIZINE DIHYDROCHLORIDE 5 MG PO TABS: 5.0000 mg | ORAL_TABLET | Freq: Every evening | ORAL | 0 refills | Status: DC

## 2022-01-11 MED ORDER — PSEUDOEPHEDRINE HCL 60 MG PO TABS: 60.0000 mg | ORAL_TABLET | Freq: Three times a day (TID) | ORAL | 0 refills | Status: DC | PRN

## 2022-01-11 MED ORDER — BENZONATATE 100 MG PO CAPS: 100.0000 mg | ORAL_CAPSULE | Freq: Three times a day (TID) | ORAL | 0 refills | Status: DC | PRN

## 2022-01-11 MED ORDER — PROMETHAZINE-DM 6.25-15 MG/5ML PO SYRP: 5.0000 mL | ORAL_SOLUTION | Freq: Every evening | ORAL | 0 refills | Status: DC | PRN

## 2022-01-11 NOTE — ED Triage Notes (Addendum)
Pt reports cough, shortness of breath and chest congestion x 1 day. States the mucus was green yesterday and brown this morning.

## 2022-01-11 NOTE — Discharge Instructions (Addendum)
We will notify you of your test results as they arrive and may take between 48-72 hours.  I encourage you to sign up for MyChart if you have not already done so as this can be the easiest way for [place] to communicate results to you online or through a phone app.  Generally, we only contact you if it is a positive test result.  In the meantime, if you develop worsening symptoms including fever, chest pain, shortness of breath despite our current treatment plan then please report to the emergency room as this may be a sign of worsening status from possible viral infection.  Otherwise, we will manage this as a viral syndrome. For sore throat or cough try using a honey-based tea. Use 3 teaspoons of honey with juice squeezed from half lemon. Place shaved pieces of ginger into 1/2-1 cup of water and warm over stove top. Then mix the ingredients and repeat every 4 hours as needed. Please take Tylenol 500mg -650mg  every 6 hours for aches and pains, fevers. Hydrate very well with at least 2 liters of water. Eat light meals such as soups to replenish electrolytes and soft fruits, veggies. Start an antihistamine like Zyrtec for postnasal drainage, sinus congestion.  You can take this together with pseudoephedrine (Sudafed) at a dose of 60 mg 2-3 times a day as needed for the same kind of congestion.  Use cough suppression medications as needed.

## 2022-01-11 NOTE — ED Provider Notes (Signed)
[facility]   MRN: [id number] DOB: Jan 27, 1961  Subjective:   [name] is a 61 y.o. male presenting for 2 day history of acute onset sinus congestion, chest congestion, shortness of breath, productive cough.  Patient has a history of allergic rhinitis but does not take anything consistently for this.  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  He had surgery for this in 1967.  Denies history of asthma.  Patient is not a smoker.  No sick contacts to his knowledge.  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  No current facility-administered medications for this encounter.  Current Outpatient Medications:    acetaminophen (TYLENOL) 500 MG tablet, Take 1,000 mg by mouth every 6 (six) hours as needed for moderate pain or headache., Disp: , Rfl:    Ascorbic Acid (VITAMIN C) 1000 MG tablet, Take 1,000 mg by mouth daily., Disp: , Rfl:    B Complex-C (SUPER B COMPLEX PO), Take 1 tablet by mouth daily., Disp: , Rfl:    Cetirizine HCl 10 MG CAPS, Take 10 mg by mouth daily., Disp: , Rfl:    omeprazole (PRILOSEC) 40 MG capsule, Take 40 mg by mouth daily with breakfast., Disp: , Rfl:    OxyCODONE HCl, Abuse Deter, (OXAYDO) 5 MG TABA, Take 1-2 tablets by mouth 2 (two) times daily as needed (pain)., Disp: , Rfl:    rosuvastatin (CRESTOR) 20 MG tablet, Take 1 tablet (20 mg total) by mouth at bedtime., Disp: 30 tablet, Rfl: 0   tiZANidine (ZANAFLEX) 4 MG tablet, Take 4 mg by mouth 4 (four) times daily as needed for muscle spasms., Disp: , Rfl:    Vitamin D, Ergocalciferol, (DRISDOL) 1.25 MG (50000 UNIT) CAPS capsule, Take 1 capsule (50,000 Units total) by mouth every 7 (seven) days., Disp: 4 capsule, Rfl: 0   Allergies  Allergen Reactions   Morphine Itching    Past Medical History:  Diagnosis Date   Allergy    Back pain    Chewing difficulty    Dementia (HCC)    Hearing loss    Lordosis  of cervicothoracic region    Muscle weakness (generalized)    Other fatigue    Poor short term memory    Shortness of breath    Shortness of breath on exertion    Swallowing difficulty    Tinnitus      Past Surgical History:  Procedure Laterality Date   ANTERIOR CERVICAL DISCECTOMY  11/23/1997   CERVICAL FUSION  2010, 2012   COLONOSCOPY  08/2014   Dr.Stark   COLONOSCOPY WITH PROPOFOL N/A 09/23/2021   Procedure: COLONOSCOPY WITH PROPOFOL;  Surgeon: Ladene Artist, MD;  Location: [facility];  Service: Endoscopy;  Laterality: N/A;   INGUINAL HERNIA REPAIR  11/23/1984   right   intestinal obstruction  11/23/2008   OTHER SURGICAL HISTORY  11/23/1964   sternum surgery   POLYPECTOMY  09/23/2021   Procedure: POLYPECTOMY;  Surgeon: Ladene Artist, MD;  Location: [facility];  Service: Endoscopy;;   ribs removed as child  11/23/1965   thornwaldt cyst removed     TONSILLECTOMY  11/23/1966   TRANSURETHRAL RESECTION OF PROSTATE  11/23/2010   ULNA OSTEOTOMY Right 10/09/2014   Procedure: Aleatha Borer SHORTENING;  Surgeon: Daryll Brod, MD;  Location: [facility];  Service: Orthopedics;  Laterality: Right;   WRIST ARTHROSCOPY Right 10/09/2014   Procedure: RIGHT WRIST ARTHROSCOPY WITH  ULNA SHORTENING OSTEOTOMY;  Surgeon: Daryll Brod, MD;  Location: [facility];  Service: Orthopedics;  Laterality: Right;    Family History  Problem Relation Age of Onset   Obesity Mother    Cancer Father    Colon polyps Father    Colon polyps Brother 17   Obesity Other    Stomach cancer Neg Hx    Colon cancer Neg Hx    Rectal cancer Neg Hx    Pancreatic cancer Neg Hx     Social History   Tobacco Use   Smoking status: Never   Smokeless tobacco: Never  Vaping Use   Vaping Use: Never used  Substance Use Topics   Alcohol use: Not Currently    Comment: rare   Drug use: No    ROS   Objective:   Vitals: BP 131/75 (BP Location: Right Arm)    Pulse 84    Temp 98.7 F (37.1  C) (Oral)    Resp (!) 22    SpO2 96%   Physical Exam Constitutional:      General: He is not in acute distress.    Appearance: Normal appearance. He is well-developed. He is not ill-appearing, toxic-appearing or diaphoretic.  HENT:     Head: Normocephalic and atraumatic.     Right Ear: External ear normal.     Left Ear: External ear normal.     Nose: Congestion and rhinorrhea present.     Mouth/Throat:     Mouth: Mucous membranes are moist.     Pharynx: No oropharyngeal exudate or posterior oropharyngeal erythema.  Eyes:     General: No scleral icterus.       Right eye: No discharge.        Left eye: No discharge.     Extraocular Movements: Extraocular movements intact.  Cardiovascular:     Rate and Rhythm: Normal rate and regular rhythm.     Heart sounds: Normal heart sounds. No murmur heard.   No friction rub. No gallop.  Pulmonary:     Effort: Pulmonary effort is normal. No respiratory distress.     Breath sounds: Normal breath sounds. No stridor. No wheezing, rhonchi or rales.  Neurological:     Mental Status: He is alert and oriented to person, place, and time.  Psychiatric:        Mood and Affect: Mood normal.        Behavior: Behavior normal.        Thought Content: Thought content normal.    Assessment and Plan :   PDMP not reviewed this encounter.  1. Viral URI with cough   2. Shortness of breath   3. History of dyspnea   4. Sinus congestion   5. Productive cough   6. History of thoracic surgery    Deferred imaging given clear cardiopulmonary exam, hemodynamically stable vital signs. Will manage for viral illness such as viral URI, viral syndrome, viral rhinitis, COVID-19. Recommended supportive care. Offered scripts for symptomatic relief. Testing is pending. Counseled patient on potential for adverse effects with medications prescribed/recommended today, ER and return-to-clinic precautions discussed, patient verbalized understanding.     Jaynee Eagles,  [place] 01/11/22 0938

## 2022-01-12 LAB — NOVEL CORONAVIRUS, NAA: SARS-CoV-2, NAA: NOT DETECTED

## 2022-01-14 DIAGNOSIS — J329 Chronic sinusitis, unspecified: Secondary | ICD-10-CM | POA: Diagnosis not present

## 2022-01-22 ENCOUNTER — Other Ambulatory Visit: Payer: Self-pay

## 2022-01-22 ENCOUNTER — Ambulatory Visit
Admission: RE | Admit: 2022-01-22 | Discharge: 2022-01-22 | Disposition: A | Discharge status: Home / Self Care | Payer: BC Managed Care – PPO | Source: Ambulatory Visit | Attending: General Surgery | Admitting: General Surgery

## 2022-01-22 DIAGNOSIS — K5730 Diverticulosis of large intestine without perforation or abscess without bleeding: Secondary | ICD-10-CM | POA: Diagnosis not present

## 2022-01-22 DIAGNOSIS — M5136 Other intervertebral disc degeneration, lumbar region: Secondary | ICD-10-CM

## 2022-01-22 DIAGNOSIS — K432 Incisional hernia without obstruction or gangrene: Secondary | ICD-10-CM

## 2022-01-26 ENCOUNTER — Ambulatory Visit (INDEPENDENT_AMBULATORY_CARE_PROVIDER_SITE_OTHER): Payer: BC Managed Care – PPO | Admitting: Family Medicine

## 2022-02-04 DIAGNOSIS — K432 Incisional hernia without obstruction or gangrene: Secondary | ICD-10-CM | POA: Diagnosis not present

## 2022-02-04 DIAGNOSIS — K4020 Bilateral inguinal hernia, without obstruction or gangrene, not specified as recurrent: Secondary | ICD-10-CM | POA: Diagnosis not present

## 2022-02-09 DIAGNOSIS — M542 Cervicalgia: Secondary | ICD-10-CM | POA: Diagnosis not present

## 2022-02-09 DIAGNOSIS — G3184 Mild cognitive impairment, so stated: Secondary | ICD-10-CM | POA: Diagnosis not present

## 2022-02-09 DIAGNOSIS — M545 Low back pain, unspecified: Secondary | ICD-10-CM | POA: Diagnosis not present

## 2022-02-12 DIAGNOSIS — M25562 Pain in left knee: Secondary | ICD-10-CM | POA: Diagnosis not present

## 2022-03-06 NOTE — Pre-Procedure Instructions (Signed)
Surgical Instructions ? ? ? Your procedure is scheduled on Tuesday, April 25th. ? Report to [facility] Main Entrance "A" at 5:30 A.M., then check in with the Admitting office. ? Call this number if you have problems the morning of surgery: ? [phone] ? ? If you have any questions prior to your surgery date call [phone]: Open Monday-Friday 8am-4pm ? ? ? Remember: ? Do not eat after midnight the night before your surgery ? ?You may drink clear liquids until 4:30 a.m the morning of your surgery.   ?Clear liquids allowed are: Water, Non-Citrus Juices (without pulp), Carbonated Beverages, Clear Tea, Black Coffee ONLY (NO MILK, CREAM OR POWDERED CREAMER of any kind), and Gatorade. ?  ? Take these medicines the morning of surgery with A SIP OF WATER:  ?cetirizine (ZYRTEC)  ? ?As needed: ?acetaminophen (TYLENOL)  ?oxyCODONE (OXY IR/ROXICODONE) ?tiZANidine (ZANAFLEX) ? ?As of today, STOP taking any Aspirin (unless otherwise instructed by your surgeon) Aleve, Naproxen, Ibuprofen, Motrin, Advil, Goody's, BC's, all herbal medications, fish oil, and all vitamins. ? ?         ?Do not wear jewelry ?Do not wear lotions, powders, colognes, or deodorant. ?Men may shave face and neck. ?Do not bring valuables to the hospital. ?Do not wear nail polish, gel polish, artificial nails, or any other type of covering on natural nails (fingers and toes) ?If you have artificial nails or gel coating that need to be removed by a nail salon, please have this removed prior to surgery. Artificial nails or gel coating may interfere with anesthesia's ability to adequately monitor your vital signs. ? ?[facility] is not responsible for any belongings or valuables. .  ? ?Do NOT Smoke (Tobacco/Vaping)  24 hours prior to your procedure ? ?If you use a CPAP at night, you may bring your mask for your overnight stay. ?  ?Contacts, glasses, hearing aids, dentures or partials may not be worn into surgery, please bring cases for these belongings ?   ?For patients admitted to the hospital, discharge time will be determined by your treatment team. ?  ?Patients discharged the day of surgery will not be allowed to drive home, and someone needs to stay with them for 24 hours. ? ? ?SURGICAL WAITING ROOM VISITATION ?Patients having surgery or a procedure in a hospital may have two support people. ?Children under the age of 75 must have an adult with them who is not the patient. ?They may stay in the waiting area during the procedure and may switch out with other visitors. If the patient needs to stay at the hospital during part of their recovery, the visitor guidelines for inpatient rooms apply. ? ?Please refer to the [facility] website for the visitor guidelines for Inpatients (after your surgery is over and you are in a regular room).  ? ? ? ? ? ?Special instructions:   ? ?Oral Hygiene is also important to reduce your risk of infection.  Remember - BRUSH YOUR TEETH THE MORNING OF SURGERY WITH YOUR REGULAR TOOTHPASTE ? ? ?[facility]- Preparing For Surgery ? ?Before surgery, you can play an important role. Because skin is not sterile, your skin needs to be as free of germs as possible. You can reduce the number of germs on your skin by washing with CHG (chlorahexidine gluconate) Soap before surgery.  CHG is an antiseptic cleaner which kills germs and bonds with the skin to continue killing germs even after washing.   ? ? ?Please do not use if you have  an allergy to CHG or antibacterial soaps. If your skin becomes reddened/irritated stop using the CHG.  ?Do not shave (including legs and underarms) for at least 48 hours prior to first CHG shower. It is OK to shave your face. ? ?Please follow these instructions carefully. ?  ? ? Shower the NIGHT BEFORE SURGERY and the MORNING OF SURGERY with CHG Soap.  ? If you chose to wash your hair, wash your hair first as usual with your normal shampoo. After you shampoo, rinse your hair and body thoroughly to remove the shampoo.   Then [employer] and genitals (private parts) with your normal soap and rinse thoroughly to remove soap. ? ?After that Use CHG Soap as you would any other liquid soap. You can apply CHG directly to the skin and wash gently with a scrungie or a clean washcloth.  ? ?Apply the CHG Soap to your body ONLY FROM THE NECK DOWN.  Do not use on open wounds or open sores. Avoid contact with your eyes, ears, mouth and genitals (private parts). Wash Face and genitals (private parts)  with your normal soap.  ? ?Wash thoroughly, paying special attention to the area where your surgery will be performed. ? ?Thoroughly rinse your body with warm water from the neck down. ? ?DO NOT shower/wash with your normal soap after using and rinsing off the CHG Soap. ? ?Pat yourself dry with a CLEAN TOWEL. ? ?Wear CLEAN PAJAMAS to bed the night before surgery ? ?Place CLEAN SHEETS on your bed the night before your surgery ? ?DO NOT SLEEP WITH PETS. ? ? ?Day of Surgery: ? ?Take a shower with CHG soap. ?Wear Clean/Comfortable clothing the morning of surgery ?Do not apply any deodorants/lotions.   ?Remember to brush your teeth WITH YOUR REGULAR TOOTHPASTE. ? ? ? ?If you received a COVID test during your pre-op visit  it is requested that you wear a mask when out in public, stay away from anyone that may not be feeling well and notify your surgeon if you develop symptoms. If you have been in contact with anyone that has tested positive in the last 10 days please notify you surgeon. ? ?  ?Please read over the following fact sheets that you were given.  ? ?

## 2022-03-09 ENCOUNTER — Encounter (HOSPITAL_COMMUNITY): Payer: Self-pay

## 2022-03-09 ENCOUNTER — Encounter (HOSPITAL_COMMUNITY)
Admission: RE | Admit: 2022-03-09 | Discharge: 2022-03-09 | Disposition: A | Discharge status: Home / Self Care | Payer: BC Managed Care – PPO | Source: Ambulatory Visit | Attending: General Surgery | Admitting: General Surgery

## 2022-03-09 ENCOUNTER — Other Ambulatory Visit: Payer: Self-pay

## 2022-03-09 ENCOUNTER — Ambulatory Visit: Payer: Self-pay | Admitting: General Surgery

## 2022-03-09 VITALS — BP 127/73 | HR 70 | Temp 98.1°F | Resp 18 | Ht 69.5 in | Wt 202.8 lb

## 2022-03-09 DIAGNOSIS — K219 Gastro-esophageal reflux disease without esophagitis: Secondary | ICD-10-CM | POA: Diagnosis not present

## 2022-03-09 DIAGNOSIS — K432 Incisional hernia without obstruction or gangrene: Secondary | ICD-10-CM | POA: Diagnosis not present

## 2022-03-09 DIAGNOSIS — K4091 Unilateral inguinal hernia, without obstruction or gangrene, recurrent: Secondary | ICD-10-CM | POA: Diagnosis not present

## 2022-03-09 DIAGNOSIS — Z01818 Encounter for other preprocedural examination: Secondary | ICD-10-CM

## 2022-03-09 DIAGNOSIS — Z01812 Encounter for preprocedural laboratory examination: Secondary | ICD-10-CM | POA: Diagnosis not present

## 2022-03-09 DIAGNOSIS — G4733 Obstructive sleep apnea (adult) (pediatric): Secondary | ICD-10-CM | POA: Diagnosis not present

## 2022-03-09 HISTORY — DX: Failed or difficult intubation, initial encounter: T88.4XXA

## 2022-03-09 HISTORY — DX: Gastro-esophageal reflux disease without esophagitis: K21.9

## 2022-03-09 HISTORY — DX: Pectus excavatum: Q67.6

## 2022-03-09 LAB — CBC
HCT: 45.9 % (ref 39.0–52.0)
Hemoglobin: 15.3 g/dL (ref 13.0–17.0)
MCH: 30.6 pg (ref 26.0–34.0)
MCHC: 33.3 g/dL (ref 30.0–36.0)
MCV: 91.8 fL (ref 80.0–100.0)
Platelets: 202 10*3/uL (ref 150–400)
RBC: 5 MIL/uL (ref 4.22–5.81)
RDW: 13.2 % (ref 11.5–15.5)
WBC: 9.6 10*3/uL (ref 4.0–10.5)
nRBC: 0 % (ref 0.0–0.2)

## 2022-03-09 LAB — BASIC METABOLIC PANEL
Anion gap: 6 (ref 5–15)
BUN: 14 mg/dL (ref 6–20)
CO2: 27 mmol/L (ref 22–32)
Calcium: 9.2 mg/dL (ref 8.9–10.3)
Chloride: 106 mmol/L (ref 98–111)
Creatinine, Ser: 1.15 mg/dL (ref 0.61–1.24)
GFR, Estimated: >60 mL/min (ref >60)
Glucose, Bld: 104 mg/dL — ABNORMAL HIGH (ref 70–99)
Potassium: 4.1 mmol/L (ref 3.5–5.1)
Sodium: 139 mmol/L (ref 135–145)

## 2022-03-09 NOTE — Pre-Procedure Instructions (Signed)
Surgical Instructions ? ? ? Your procedure is scheduled on Tuesday, April 25th. ? Report to [facility] Main Entrance "A" at 5:30 A.M., then check in with the Admitting office. ? Call this number if you have problems the morning of surgery: ? [phone] ? ? If you have any questions prior to your surgery date call [phone]: Open Monday-Friday 8am-4pm ? ? ? Remember: ? Do not eat after midnight the night before your surgery ? ?You may drink clear liquids until 4:30 a.m the morning of your surgery.   ?Clear liquids allowed are: Water, Non-Citrus Juices (without pulp), Carbonated Beverages, Clear Tea, Black Coffee ONLY (NO MILK, CREAM OR POWDERED CREAMER of any kind), and Gatorade. ? ?Patient Instructions ? ?The night before surgery:  ? ?  The night before surgery drink TWO (2) Pre-Surgery Clear Ensure. ? ?No food after midnight. ONLY clear liquids after midnight ? ?The day of surgery (if you do NOT have diabetes):  ?Drink ONE (1) Pre-Surgery Clear Ensure by 04:30 the morning of surgery. Drink in one sitting. Do not sip.  ?This drink was given to you during your hospital  ?pre-op appointment visit. ? ?Nothing else to drink after completing the  ?Pre-Surgery Clear Ensure. ? ? ?       If you have questions, please contact your surgeon?s office.  ?  ? Take these medicines the morning of surgery with A SIP OF WATER:  ? ?cetirizine (ZYRTEC)  ? ?As needed: ?acetaminophen (TYLENOL)  ?oxyCODONE (OXY IR/ROXICODONE) ?tiZANidine (ZANAFLEX) ? ?As of today, STOP taking any Aspirin (unless otherwise instructed by your surgeon) Aleve, Naproxen, Ibuprofen, Motrin, Advil, Goody's, BC's, all herbal medications, fish oil, and all vitamins. ? ? ? The day of surgery: ?         ?Do not wear jewelry ?Do not wear lotions, powders, colognes, or deodorant. ?Men may shave face and neck. ?Do not bring valuables to the hospital. ? ?[facility] is not responsible for any belongings or valuables. .  ? ?Do NOT Smoke (Tobacco/Vaping)  24 hours  prior to your procedure ? ?If you use a CPAP at night, you may bring your mask for your overnight stay. ?  ?Contacts, glasses, hearing aids, dentures or partials may not be worn into surgery, please bring cases for these belongings ?  ?For patients admitted to the hospital, discharge time will be determined by your treatment team. ?  ?Patients discharged the day of surgery will not be allowed to drive home, and someone needs to stay with them for 24 hours. ? ? ?SURGICAL WAITING ROOM VISITATION ?Patients having surgery or a procedure in a hospital may have two support people. ?Children under the age of 88 must have an adult with them who is not the patient. ?They may stay in the waiting area during the procedure and may switch out with other visitors. If the patient needs to stay at the hospital during part of their recovery, the visitor guidelines for inpatient rooms apply. ? ?Please refer to the [facility] website for the visitor guidelines for Inpatients (after your surgery is over and you are in a regular room).  ? ? ?Special instructions:   ? ?Oral Hygiene is also important to reduce your risk of infection.  Remember - BRUSH YOUR TEETH THE MORNING OF SURGERY WITH YOUR REGULAR TOOTHPASTE ? ? ?[facility]- Preparing For Surgery ? ?Before surgery, you can play an important role. Because skin is not sterile, your skin needs to be as free of germs as possible.  You can reduce the number of germs on your skin by washing with CHG (chlorahexidine gluconate) Soap before surgery.  CHG is an antiseptic cleaner which kills germs and bonds with the skin to continue killing germs even after washing.   ? ? ?Please do not use if you have an allergy to CHG or antibacterial soaps. If your skin becomes reddened/irritated stop using the CHG.  ?Do not shave (including legs and underarms) for at least 48 hours prior to first CHG shower. It is OK to shave your face. ? ?Please follow these instructions carefully. ?  ? ? Shower the NIGHT  BEFORE SURGERY and the MORNING OF SURGERY with CHG Soap.  ? If you chose to wash your hair, wash your hair first as usual with your normal shampoo. After you shampoo, rinse your hair and body thoroughly to remove the shampoo.  Then [employer] and genitals (private parts) with your normal soap and rinse thoroughly to remove soap. ? ?After that Use CHG Soap as you would any other liquid soap. You can apply CHG directly to the skin and wash gently with a scrungie or a clean washcloth.  ? ?Apply the CHG Soap to your body ONLY FROM THE NECK DOWN.  Do not use on open wounds or open sores. Avoid contact with your eyes, ears, mouth and genitals (private parts). Wash Face and genitals (private parts)  with your normal soap.  ? ?Wash thoroughly, paying special attention to the area where your surgery will be performed. ? ?Thoroughly rinse your body with warm water from the neck down. ? ?DO NOT shower/wash with your normal soap after using and rinsing off the CHG Soap. ? ?Pat yourself dry with a CLEAN TOWEL. ? ?Wear CLEAN PAJAMAS to bed the night before surgery ? ?Place CLEAN SHEETS on your bed the night before your surgery ? ?DO NOT SLEEP WITH PETS. ? ? ?Day of Surgery: ? ?Take a shower with CHG soap. ?Wear Clean/Comfortable clothing the morning of surgery ?Do not apply any deodorants/lotions.   ?Remember to brush your teeth WITH YOUR REGULAR TOOTHPASTE. ? ? ? ?If you received a COVID test during your pre-op visit  it is requested that you wear a mask when out in public, stay away from anyone that may not be feeling well and notify your surgeon if you develop symptoms. If you have been in contact with anyone that has tested positive in the last 10 days please notify you surgeon. ? ?  ?Please read over the following fact sheets that you were given.  ? ?

## 2022-03-09 NOTE — Progress Notes (Addendum)
PCP - Redmond School, MD ?Cardiologist - denies ? ?PPM/ICD - denies ?Device Orders - n/a ?Rep Notified - n/a ? ?Chest x-ray - n/a ?EKG - 08/12/2021 ?Stress Test - denies ?ECHO - denies ?Cardiac Cath - denies ? ?Sleep Study - denies ?CPAP - n/a ? ?Fasting Blood Sugar - n/a ? ?Blood Thinner Instructions: n/a ? ?Aspirin Instructions: Patient was instructed: As of today, STOP taking any Aspirin (unless otherwise instructed by your surgeon) Aleve, Naproxen, Ibuprofen, Motrin, Advil, Goody's, BC's, all herbal medications, fish oil, and all vitamins. ? ?ERAS Protcol - yes ?PRE-SURGERY Ensure - yes, two drinks the night before surgery and one drink the morning of surgery  ? ?COVID TEST- n/a ? ?Anesthesia review: yes - difficult intubation. On the past medical history patient has listed Dementia. Patient denied and this Probation officer called patient's wife; Mrs. Alemu said that patient has in his family history of Dementia, but he doesn't have.   ? ?Patient denies shortness of breath, fever, cough and chest pain at PAT appointment ? ? ?All instructions explained to the patient, with a verbal understanding of the material. Patient agrees to go over the instructions while at home for a better understanding. Patient also instructed to self quarantine after being tested for COVID-19. The opportunity to ask questions was provided. ?  ?

## 2022-03-09 NOTE — H&P (View-Only) (Signed)
?Chief Complaint: Follow-up ? ? ?History of Present Illness: ?[name] is a 61 y.o. male who is seen today for follow-up from an incisional midline hernia as well as bilateral inguinal hernias. ? ?Patient did have a CT scan in the interim. This did show a 2 cm umbilical hernia containing bowel. Patient also with fat-containing bilateral hernias. The right side is recurrent patient states he had this repaired in the open fashion in the mid 1980s. ? ?Patient does state he has pain in the bilateral inguinal areas. He also has a left paramedian incision in which they did lumbar spine work approximately 2 to 3 years ago. There appears to be no hernia on CT scan there. ? ?. ? ? ? ?Review of Systems: ?A complete review of systems was obtained from the patient. I have reviewed this information and discussed as appropriate with the patient. See HPI as well for other ROS. ? ?Review of Systems  ?Constitutional: Negative for fever.  ?HENT: Negative for congestion.  ?Eyes: Negative for blurred vision.  ?Respiratory: Negative for cough, shortness of breath and wheezing.  ?Cardiovascular: Negative for chest pain and palpitations.  ?Gastrointestinal: Negative for heartburn.  ?Genitourinary: Negative for dysuria.  ?Musculoskeletal: Negative for myalgias.  ?Skin: Negative for rash.  ?Neurological: Negative for dizziness and headaches.  ?Psychiatric/Behavioral: Negative for depression and suicidal ideas.  ?All other systems reviewed and are negative. ? ? ?Medical History: ?Past Medical History:  ?Diagnosis Date  ? Chronic neck pain 03/2015  ? Difficult airway 04/29/2016  ?surgery w/ Dr. Pamala Hurry @ [facility]  ? Facet arthritis of lumbar region 09/12/2020  ? GERD (gastroesophageal reflux disease)  ? Numbness and tingling in both hands  ?R>L  ? Seasonal allergies  ? Sore throat, unspecified  ?after cervical surgery  ? ?Patient Active Problem List  ?Diagnosis  ? Numbness and tingling in both hands  ? Chronic neck pain  ? Cervical spinal  stenosis  ? Bladder neck obstruction  ? SBO (small bowel obstruction) (CMS-HCC)  ? Dysphagia  ? Urinary retention  ? Chronic low back pain with sciatica  ? Status post lumbar spine surgery for decompression of spinal cord  ? Fusion of spine of cervical region  ? Radiculopathy of lumbar region  ? Facet arthritis of lumbar region  ? DDD (degenerative disc disease), lumbar  ? ?Past Surgical History:  ?Procedure Laterality Date  ? RECONSTRUCTION PECTUS CARINATUM/EXCAVATUM PEDIATRIC 1967  ?4.4 Haller Index  ? TONSILLECTOMY 1970  ? INGUINAL HERNIA REPAIR Left 1987  ? SPINE SURGERY 08/14/1997  ?C3-C4 ACDF c Synthes - Blanche East, MD  ? SPINE SURGERY 12/28/2006  ?C4-C5 ACDF - Blanche East, MD  ? EXPLORATORY LAPAROTOMY 12/31/2006  ?LOA, ileus - Orvilla Fus, MD  ? TURP SURGERY 2012  ? TRANSNASAL CYST REMOVAL 2012  ?Removal of Thornwaldt Cyst - Lanice Schwab, MD  ? [place] SURGERY 03/18/2011  ?Removal of C4-C5 plating; new C5-C7 ACDF - Blanche East, MD  ? ENDOSCOPIC TRANSNASAL BIOPSY 07/31/2011  ?nasopharyngeal mass - Lanice Schwab, MD  ? ULNAR SURGERY Right 2015  ?shortened ulna and realigned wrist - Emeline General, MD  ? POSTERIOR CERVICLE SPINE FUSION 1 LEVEL N/A 04/29/2016  ?Procedure: POSTERIOR CERVICAL SPINE FUSION C 6-7 with instrumentation DTRAX Cervical cage; Surgeon: Redge Gainer, MD; Location: [facility]; Service: Neurosurgery; Laterality: N/A;  ? INSTRUMENTATION POSTERIOR SPINE 3 TO 6 VERTEBRAL SEGMENTS N/A 04/29/2016  ?Procedure: INSTRUMENTATION POSTERIOR SPINE 1 VERTEBRAL SEGMENT with DTRAX cervical cage ; Surgeon: Redge Gainer, MD; Location:  [facility]; Service: Neurosurgery; Laterality: N/A;  ? ARTHRODESIS ANTERIOR CERVICLE SPINE N/A 09/26/2019  ?Procedure: Anterior Cervical Discectomy and Fusion at C2-3 with Vertigraft VG1 allograft and Nuvasive Archon plate; Surgeon: Redge Gainer, MD; Location: [place]; Service: Neurosurgery; Laterality: N/A;  ? INSTRUMENTATION ANTERIOR SPINE 4 TO 7  SEGMENTS N/A 09/26/2019  ?Procedure: INSTRUMENTATION ANTERIOR SPINE; Surgeon: Redge Gainer, MD; Location: [place]; Service: Neurosurgery; Laterality: N/A;  ? INSERTION STRUCTURAL BONE ALLOGRAFT FOR SPINE SURGERY N/A 09/26/2019  ?Procedure: INSERTION STRUCTURAL BONE ALLOGRAFT FOR SPINE SURGERY; Surgeon: Redge Gainer, MD; Location: [place]; Service: Neurosurgery; Laterality: N/A;  ? LAMINECTOMY POSTERIOR LUMBAR FACETECTOMY & FORAMINOTOMY W/DECOMP N/A 11/14/2019  ?Procedure: partial Lumbar 4 , Complete Lumbar 5 laminectomy with bilateral foraminotomies at L4 and L5 and Left L4-5 lumbar microdiscectomy; Surgeon: Redge Gainer, MD; Location: [place]; Service: Neurosurgery; Laterality: N/A;  ? OBLIQUE LATERAL INTERBODY FUSION LUMBAR N/A 03/18/2021  ?Procedure: **AIRO 1** ARTHRODESIS, ANTERIOR INTERBODY TECHNIQUE, INCLUDING MINIMAL DISCECTOMY TO PREPARE INTERSPACE (OTHER THAN FOR DECOMPRESSION); LUMBAR; Surgeon: Than, Elmyra Ricks, MD; Location: [facility]; Service: Neurosurgery; Laterality: N/A;  ? INSERTION INTERBODY BIOMECHANICAL DEVICE W/ INSTRUMENTATION N/A 03/18/2021  ?Procedure: INSERTION INTERBODY BIOMECHANICAL DEVICE WITH INTEGRAL ANTERIOR INSTRUMENTATION, TO INTERVERTEBRAL DISC SPACE IN CONJUNCTION INTERBODY ARTHRODESIS, EACH INTERSPACE (LIST CODE FOR PRIMARY PROCEDURE); Surgeon: Than, Elmyra Ricks, MD; Location: [facility]; Service: Neurosurgery; Laterality: N/A;  ? INSERTION MORSELIZED BONE ALLOGRAFT FOR SPINE SURGERY N/A 03/18/2021  ?Procedure: ALLOGRAFT, MORSELIZED, OR PLACEMENT OF OSTEOPROMOTIVE MATERIAL, FOR SPINE SURGERY ONLY (LIST IN ADDITION TO PRIMARY PROCEDURE); Surgeon: Than, Elmyra Ricks, MD; Location: [facility]; Service: Neurosurgery; Laterality: N/A;  ? INSTRUMENTATION NON-SEGMENTAL POSTERIOR SPINE N/A 03/18/2021  ?Procedure: INSTRUMENTATION POSTERIOR SPINE 1/2 VERTEBRAL SEGMENTS W/O FIXATION ADDITIONAL FIFTH; Surgeon: Than, Elmyra Ricks, MD; Location: [facility];  Service: Neurosurgery; Laterality: N/A;  ? BACK SURGERY  ? COLONOSCOPY  ? FRACTURE SURGERY  ?right arm - ulna  ? ? ?Allergies  ?Allergen Reactions  ? Pollen Extracts Other (See Comments)  ? Morphine Itching  ? ?Current Outpatient Medications on File Prior to Visit  ?Medication Sig Dispense Refill  ? acetaminophen (TYLENOL) 325 MG tablet Take 975 mg by mouth every 4 (four) hours as needed for Pain  ? ascorbic acid, vitamin C, (VITAMIN C) 100 MG tablet Take 100 mg by mouth once daily  ? cetirizine (ZYRTEC) 10 MG tablet Take 10 mg by mouth once daily  ? methocarbamoL (ROBAXIN) 750 MG tablet Take 750 mg by mouth 4 (four) times daily (Patient not taking: No sig reported)  ? omeprazole (PRILOSEC) 40 MG DR capsule Take 40 mg by mouth once daily (Patient not taking: Reported on 12/26/2021)  ? oxyCODONE (ROXICODONE) 5 MG immediate release tablet Take 5 mg by mouth every 4 (four) hours as needed for Pain (Patient not taking: No sig reported)  ? rosuvastatin (CRESTOR) 10 MG tablet (Patient not taking: Reported on 12/26/2021)  ? vitamin E 400 UNIT capsule Take 400 Units by mouth once daily  ? ?No current facility-administered medications on file prior to visit.  ? ?Family History  ?Problem Relation Age of Onset  ? Alzheimer's disease Mother  ? Myocardial Infarction (Heart attack) Father  ?2007  ? No Known Problems Sister  ? No Known Problems Brother  ? Malignant hyperthermia Neg Hx  ? PONV Neg Hx  ? ? ?Social History  ? ?Tobacco Use  ?Smoking Status Never  ?Smokeless Tobacco Never  ? ? ?Social History  ? ?Socioeconomic History  ?  Marital status: Married  ?Tobacco Use  ? Smoking status: Never  ? Smokeless tobacco: Never  ?Vaping Use  ? Vaping Use: Never used  ?Substance and Sexual Activity  ? Alcohol use: Not Currently  ? Drug use: No  ? Sexual activity: Defer  ?Comment: Married to [place].  ?Social History Narrative  ?Married to [place]. Patient works in a Research officer, trade union.  ? ?Social Determinants of Health   ? ?Transportation Needs: No Transportation Needs  ? Lack of Transportation (Medical): No  ? Lack of Transportation (Non-Medical): No  ? ?Objective:  ? ?There were no vitals filed for this visit.  ?There is no

## 2022-03-09 NOTE — H&P (Signed)
?Chief Complaint: Follow-up ? ? ?History of Present Illness: ?[name] is a 61 y.o. male who is seen today for follow-up from an incisional midline hernia as well as bilateral inguinal hernias. ? ?Patient did have a CT scan in the interim. This did show a 2 cm umbilical hernia containing bowel. Patient also with fat-containing bilateral hernias. The right side is recurrent patient states he had this repaired in the open fashion in the mid 1980s. ? ?Patient does state he has pain in the bilateral inguinal areas. He also has a left paramedian incision in which they did lumbar spine work approximately 2 to 3 years ago. There appears to be no hernia on CT scan there. ? ?. ? ? ? ?Review of Systems: ?A complete review of systems was obtained from the patient. I have reviewed this information and discussed as appropriate with the patient. See HPI as well for other ROS. ? ?Review of Systems  ?Constitutional: Negative for fever.  ?HENT: Negative for congestion.  ?Eyes: Negative for blurred vision.  ?Respiratory: Negative for cough, shortness of breath and wheezing.  ?Cardiovascular: Negative for chest pain and palpitations.  ?Gastrointestinal: Negative for heartburn.  ?Genitourinary: Negative for dysuria.  ?Musculoskeletal: Negative for myalgias.  ?Skin: Negative for rash.  ?Neurological: Negative for dizziness and headaches.  ?Psychiatric/Behavioral: Negative for depression and suicidal ideas.  ?All other systems reviewed and are negative. ? ? ?Medical History: ?Past Medical History:  ?Diagnosis Date  ? Chronic neck pain 03/2015  ? Difficult airway 04/29/2016  ?surgery w/ Dr. Pamala Hurry @ [facility]  ? Facet arthritis of lumbar region 09/12/2020  ? GERD (gastroesophageal reflux disease)  ? Numbness and tingling in both hands  ?R>L  ? Seasonal allergies  ? Sore throat, unspecified  ?after cervical surgery  ? ?Patient Active Problem List  ?Diagnosis  ? Numbness and tingling in both hands  ? Chronic neck pain  ? Cervical spinal  stenosis  ? Bladder neck obstruction  ? SBO (small bowel obstruction) (CMS-HCC)  ? Dysphagia  ? Urinary retention  ? Chronic low back pain with sciatica  ? Status post lumbar spine surgery for decompression of spinal cord  ? Fusion of spine of cervical region  ? Radiculopathy of lumbar region  ? Facet arthritis of lumbar region  ? DDD (degenerative disc disease), lumbar  ? ?Past Surgical History:  ?Procedure Laterality Date  ? RECONSTRUCTION PECTUS CARINATUM/EXCAVATUM PEDIATRIC 1967  ?4.4 Haller Index  ? TONSILLECTOMY 1970  ? INGUINAL HERNIA REPAIR Left 1987  ? SPINE SURGERY 08/14/1997  ?C3-C4 ACDF c Synthes - Blanche East, MD  ? SPINE SURGERY 12/28/2006  ?C4-C5 ACDF - Blanche East, MD  ? EXPLORATORY LAPAROTOMY 12/31/2006  ?LOA, ileus - Orvilla Fus, MD  ? TURP SURGERY 2012  ? TRANSNASAL CYST REMOVAL 2012  ?Removal of Thornwaldt Cyst - Lanice Schwab, MD  ? [place] SURGERY 03/18/2011  ?Removal of C4-C5 plating; new C5-C7 ACDF - Blanche East, MD  ? ENDOSCOPIC TRANSNASAL BIOPSY 07/31/2011  ?nasopharyngeal mass - Lanice Schwab, MD  ? ULNAR SURGERY Right 2015  ?shortened ulna and realigned wrist - Emeline General, MD  ? POSTERIOR CERVICLE SPINE FUSION 1 LEVEL N/A 04/29/2016  ?Procedure: POSTERIOR CERVICAL SPINE FUSION C 6-7 with instrumentation DTRAX Cervical cage; Surgeon: Redge Gainer, MD; Location: [facility]; Service: Neurosurgery; Laterality: N/A;  ? INSTRUMENTATION POSTERIOR SPINE 3 TO 6 VERTEBRAL SEGMENTS N/A 04/29/2016  ?Procedure: INSTRUMENTATION POSTERIOR SPINE 1 VERTEBRAL SEGMENT with DTRAX cervical cage ; Surgeon: Redge Gainer, MD; Location:  [facility]; Service: Neurosurgery; Laterality: N/A;  ? ARTHRODESIS ANTERIOR CERVICLE SPINE N/A 09/26/2019  ?Procedure: Anterior Cervical Discectomy and Fusion at C2-3 with Vertigraft VG1 allograft and Nuvasive Archon plate; Surgeon: Redge Gainer, MD; Location: [place]; Service: Neurosurgery; Laterality: N/A;  ? INSTRUMENTATION ANTERIOR SPINE 4 TO 7  SEGMENTS N/A 09/26/2019  ?Procedure: INSTRUMENTATION ANTERIOR SPINE; Surgeon: Redge Gainer, MD; Location: [place]; Service: Neurosurgery; Laterality: N/A;  ? INSERTION STRUCTURAL BONE ALLOGRAFT FOR SPINE SURGERY N/A 09/26/2019  ?Procedure: INSERTION STRUCTURAL BONE ALLOGRAFT FOR SPINE SURGERY; Surgeon: Redge Gainer, MD; Location: [place]; Service: Neurosurgery; Laterality: N/A;  ? LAMINECTOMY POSTERIOR LUMBAR FACETECTOMY & FORAMINOTOMY W/DECOMP N/A 11/14/2019  ?Procedure: partial Lumbar 4 , Complete Lumbar 5 laminectomy with bilateral foraminotomies at L4 and L5 and Left L4-5 lumbar microdiscectomy; Surgeon: Redge Gainer, MD; Location: [place]; Service: Neurosurgery; Laterality: N/A;  ? OBLIQUE LATERAL INTERBODY FUSION LUMBAR N/A 03/18/2021  ?Procedure: **AIRO 1** ARTHRODESIS, ANTERIOR INTERBODY TECHNIQUE, INCLUDING MINIMAL DISCECTOMY TO PREPARE INTERSPACE (OTHER THAN FOR DECOMPRESSION); LUMBAR; Surgeon: Than, Elmyra Ricks, MD; Location: [facility]; Service: Neurosurgery; Laterality: N/A;  ? INSERTION INTERBODY BIOMECHANICAL DEVICE W/ INSTRUMENTATION N/A 03/18/2021  ?Procedure: INSERTION INTERBODY BIOMECHANICAL DEVICE WITH INTEGRAL ANTERIOR INSTRUMENTATION, TO INTERVERTEBRAL DISC SPACE IN CONJUNCTION INTERBODY ARTHRODESIS, EACH INTERSPACE (LIST CODE FOR PRIMARY PROCEDURE); Surgeon: Than, Elmyra Ricks, MD; Location: [facility]; Service: Neurosurgery; Laterality: N/A;  ? INSERTION MORSELIZED BONE ALLOGRAFT FOR SPINE SURGERY N/A 03/18/2021  ?Procedure: ALLOGRAFT, MORSELIZED, OR PLACEMENT OF OSTEOPROMOTIVE MATERIAL, FOR SPINE SURGERY ONLY (LIST IN ADDITION TO PRIMARY PROCEDURE); Surgeon: Than, Elmyra Ricks, MD; Location: [facility]; Service: Neurosurgery; Laterality: N/A;  ? INSTRUMENTATION NON-SEGMENTAL POSTERIOR SPINE N/A 03/18/2021  ?Procedure: INSTRUMENTATION POSTERIOR SPINE 1/2 VERTEBRAL SEGMENTS W/O FIXATION ADDITIONAL FIFTH; Surgeon: Than, Elmyra Ricks, MD; Location: [facility];  Service: Neurosurgery; Laterality: N/A;  ? BACK SURGERY  ? COLONOSCOPY  ? FRACTURE SURGERY  ?right arm - ulna  ? ? ?Allergies  ?Allergen Reactions  ? Pollen Extracts Other (See Comments)  ? Morphine Itching  ? ?Current Outpatient Medications on File Prior to Visit  ?Medication Sig Dispense Refill  ? acetaminophen (TYLENOL) 325 MG tablet Take 975 mg by mouth every 4 (four) hours as needed for Pain  ? ascorbic acid, vitamin C, (VITAMIN C) 100 MG tablet Take 100 mg by mouth once daily  ? cetirizine (ZYRTEC) 10 MG tablet Take 10 mg by mouth once daily  ? methocarbamoL (ROBAXIN) 750 MG tablet Take 750 mg by mouth 4 (four) times daily (Patient not taking: No sig reported)  ? omeprazole (PRILOSEC) 40 MG DR capsule Take 40 mg by mouth once daily (Patient not taking: Reported on 12/26/2021)  ? oxyCODONE (ROXICODONE) 5 MG immediate release tablet Take 5 mg by mouth every 4 (four) hours as needed for Pain (Patient not taking: No sig reported)  ? rosuvastatin (CRESTOR) 10 MG tablet (Patient not taking: Reported on 12/26/2021)  ? vitamin E 400 UNIT capsule Take 400 Units by mouth once daily  ? ?No current facility-administered medications on file prior to visit.  ? ?Family History  ?Problem Relation Age of Onset  ? Alzheimer's disease Mother  ? Myocardial Infarction (Heart attack) Father  ?2007  ? No Known Problems Sister  ? No Known Problems Brother  ? Malignant hyperthermia Neg Hx  ? PONV Neg Hx  ? ? ?Social History  ? ?Tobacco Use  ?Smoking Status Never  ?Smokeless Tobacco Never  ? ? ?Social History  ? ?Socioeconomic History  ?  Marital status: Married  ?Tobacco Use  ? Smoking status: Never  ? Smokeless tobacco: Never  ?Vaping Use  ? Vaping Use: Never used  ?Substance and Sexual Activity  ? Alcohol use: Not Currently  ? Drug use: No  ? Sexual activity: Defer  ?Comment: Married to [place].  ?Social History Narrative  ?Married to Luis M. [place]. Patient works in a Research officer, trade union.  ? ?Social Determinants of Health   ? ?Transportation Needs: No Transportation Needs  ? Lack of Transportation (Medical): No  ? Lack of Transportation (Non-Medical): No  ? ?Objective:  ? ?There were no vitals filed for this visit.  ?There is no

## 2022-03-10 ENCOUNTER — Encounter (HOSPITAL_COMMUNITY): Payer: Self-pay

## 2022-03-10 NOTE — Anesthesia Preprocedure Evaluation (Addendum)
Anesthesia Evaluation  ?Patient identified by MRN, date of birth, ID band ?Patient awake ? ? ? ?Reviewed: ?Allergy & Precautions, NPO status , Patient's Chart, lab work & pertinent test results ? ?History of Anesthesia Complications ?(+) DIFFICULT AIRWAY and history of anesthetic complications ? ?Airway ?Mallampati: IV ? ?TM Distance: <3 FB ?Neck ROM: Limited ? ?Mouth opening: Limited Mouth Opening ? Dental ? ?(+) Dental Advisory Given ?  ?Pulmonary ?neg pulmonary ROS,  ?  ?breath sounds clear to auscultation ? ? ? ? ? ? Cardiovascular ?negative cardio ROS ? ? ?Rhythm:Regular Rate:Normal ? ? ?  ?Neuro/Psych ?negative neurological ROS ?   ? GI/Hepatic ?Neg liver ROS, GERD  ,  ?Endo/Other  ?negative endocrine ROS ? Renal/GU ?negative Renal ROS  ? ?  ?Musculoskeletal ? ? Abdominal ?  ?Peds ? Hematology ?negative hematology ROS ?(+)   ?Anesthesia Other Findings ? ? Reproductive/Obstetrics ? ?  ? ? ? ? ? ? ? ? ? ? ? ? ? ?  ?  ? ? ? ? ? ? ? ?Anesthesia Physical ?Anesthesia Plan ? ?ASA: 2 ? ?Anesthesia Plan: General  ? ?Post-op Pain Management: Tylenol PO (pre-op)*, Toradol IV (intra-op)* and Ketamine IV*  ? ?Induction: Intravenous ? ?PONV Risk Score and Plan: 2 and Dexamethasone, Ondansetron and Treatment may vary due to age or medical condition ? ?Airway Management Planned: Oral ETT and Video Laryngoscope Planned ? ?Additional Equipment: None ? ?Intra-op Plan:  ? ?Post-operative Plan: Extubation in OR ? ?Informed Consent: I have reviewed the patients History and Physical, chart, labs and discussed the procedure including the risks, benefits and alternatives for the proposed anesthesia with the patient or authorized representative who has indicated his/her understanding and acceptance.  ? ? ? ?Dental advisory given ? ?Plan Discussed with: CRNA ? ?Anesthesia Plan Comments:   ? ? ? ? ? ?Anesthesia Quick Evaluation ? ?

## 2022-03-10 NOTE — Progress Notes (Signed)
Anesthesia Chart Review: ? Case: [id number] Date/Time: 03/17/22 0715  ? Procedures:  ?    XI ROBOTIC ASSISTED VENTRAL HERNIA WITH MESH ?    BILATERAL INGUINAL HERNIA REPAIR WITH MESH (Bilateral)  ? Anesthesia type: General  ? Pre-op diagnosis: INCISIONAL HERNIA, LEFT INGUINAL HERNIA, RECURRENT RIGHT INGUINAL HERNIA  ? Location: [facility] ROOM 10 / [facility] OR  ? Surgeons: [name], MD  ? ?  ? ? ?DISCUSSION: Patient is a 61 year old male scheduled for the above procedure.  ? 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 05/09/21 HST showed mild OSA, severity not requiring positive pressure treatment. ? ?ANESTHESIA AIRWAY HISTORY includes:  ?Difficult airway documented with 04/29/16 posterior cervical fusion with DUHS. Pre records: ?Endotracheal/SGA details: ETT ?Cuffed: yes  ?Successful intubation technique: direct laryngoscopy ?Facilitating devices/methods: Bougie and anterior pressure/BURP ?Endotracheal tube insertion site: oral ?Blade: McGrath ?Blade size: #3 (4 was far too big for patient's anatomy) ?ETT size: 8.0 mm ?Cormack-Lehane Classification: grade IIa - partial view of glottis (tip of mcgrath was placed underneath epiglottis to lift it up (like a miller) because otherwise it was a grade 3b view with tip in the vallecula) ?Placement verified by: chest auscultation, capnometry and palpation of cuff  ?Initial leak: Yes ?Measured from: lips ?ETT to lips (cm): 24 ?Number of attempts at approach: 2 ?Ventilation between attempts: mask ? ?Most recent Airway documentation for  03/18/21 ALIF with DUHS: "03/18/21; 0758 (created via procedure documentation); Blade size: 4; Video laryngoscopic view grade: 2; Airway size (mm): 7.0; Cuffed; Insertion attempts: 1; Insertion site: oral; Placement verification: Bilateral Breath Sounds, ETCO2; Secured at 23 cm; Measured from Lips; Complex? No; Mask airway: Easy; Video-assisted blade: Glidescope; Comments: Patient intubated in supine position with head and neck in neutral position. Glidescope #4 use as initial attempt due to previous cervical fusion as well as small mouth opening. Minimal neck movement used throughout intubation - Grade III airway visualized; BURP improved view to grade IIa. Atraumatic placement of ETT with use of Glidescope stylet. Dentition unchanged from preoperative assessment. Eyes taped prior to intubation" ? ?Mr. Strike has undergone multiple procedure within the past year including ALIF, Urolift, and colonoscopy. Recent evaluation by Dr. Rosendo Gros revealed an incisional midline hernia as well as bilateral inguinal hernias.incarcerated 2 cm umbilical/incisional hernia. ?Patient also with bilateral fat-containing inguinal hernias. Patient is symptomatic from these currently. ? ?At this time we will proceed to the operating for a robotic midline incisional hernia repair with mesh, lysis of adhesions, and concurrent bilateral inguinal hernia repair with mesh. CT scan showed "incarcerated 2 cm umbilical/incisional hernia. ?Patient also with bilateral fat-containing inguinal hernias. Patient is symptomatic from these currently. ? ?At this time we will proceed to the operating for a robotic midline incisional hernia repair with mesh, lysis of adhesions, and concurrent bilateral inguinal hernia repair with mesh." ? ?Anesthesia team will evaluate on the day of surgery. ? ? ?VS: BP 127/73   Pulse 70   Temp 36.7 ?C   Resp 18   Ht 5' 9.5" (1.765 m)   Wt 92 kg   SpO2 99%   BMI 29.52 kg/m?  ? ?PROVIDERS: ?Redmond School, MD is  PCP  ?-  Mellody Dance, DO is provider at [facility] ?- Ihor Dow, MD is urologist ? ? ?LABS: Labs reviewed: Acceptable for surgery. ?(all labs ordered are listed, but only abnormal results are displayed) ? ?Labs Reviewed  ?BASIC METABOLIC PANEL - Abnormal; Notable for the following components:  ?    Result Value  ? Glucose, Bld 104 (*)   ? All other components within normal limits  ?CBC  ? ? ?Home Sleep Study 05/09/21: ?IMPRESSIONS ?  Mild obstructive sleep apnea syndrome is documented with this recording. The severity does not require positive pressure treatment. ? ? ?IMAGES: ?CT Abd/pelvis 01/22/22: ?IMPRESSION: ?- Palpable abnormalities correspond to a stable anterior wall ?abdominal hernia adjacent to the umbilicus stable from the prior ?exam. The second left-sided palpable abnormality corresponds to ?scarring from previous surgery new from the prior exam. The third ?palpable abnormality in the right lower quadrant corresponds to a ?fat containing right inguinal hernia. A small recurrent fat ?containing left inguinal hernia is noted as well. ?- Diverticulosis without diverticulitis. ?- Chronic changes similar to that seen on the prior exam. ?  ? ?EKG: 08/12/21: ?Sinus  Rhythm  ?-Right axis -consider right ventricular hypertrophy.  ? -Anterior infarct -age undetermined.  ? ? ?CV: N/A ? ?Past Medical History:  ?Diagnosis Date  ? Allergy   ? Back pain   ? Chewing difficulty   ? Difficult intubation   ? GERD (gastroesophageal reflux disease)   ? Hearing loss   ? Lordosis of cervicothoracic region   ? Muscle weakness (generalized)   ? Other fatigue   ? Pectus excavatum   ? Poor short term memory   ? Shortness of breath on exertion   ? Swallowing difficulty   ? Tinnitus   ? ? ?Past Surgical History:  ?Procedure Laterality Date  ? ANTERIOR CERVICAL DISCECTOMY  11/23/1997  ? BACK SURGERY    ? CERVICAL FUSION  2010, 2012  ? COLONOSCOPY  08/2014  ? Dr.Stark  ? COLONOSCOPY WITH PROPOFOL N/A  09/23/2021  ? Procedure: COLONOSCOPY WITH PROPOFOL;  Surgeon: Ladene Artist, MD;  Location: [facility];  Service: Endoscopy;  Laterality: N/A;  ? INGUINAL HERNIA REPAIR  11/23/1984  ? right  ? intestinal obstruction  11/23/2008  ? OTHER SURGICAL HISTORY  11/23/1964  ? sternum surgery  ? POLYPECTOMY  09/23/2021  ? Procedure: POLYPECTOMY;  Surgeon: Ladene Artist, MD;  Location: [facility];  Service: Endoscopy;;  ? ribs removed as child  11/23/1965  ? thornwaldt cyst removed    ? TONSILLECTOMY  11/23/1966  ? TRANSURETHRAL RESECTION OF PROSTATE  11/23/2010  ? ULNA OSTEOTOMY Right 10/09/2014  ? Procedure: ULNAR SHORTENING;  Surgeon: Daryll Brod, MD;  Location: [facility];  Service: Orthopedics;  Laterality: Right;  ? WRIST ARTHROSCOPY Right 10/09/2014  ? Procedure: RIGHT WRIST ARTHROSCOPY WITH ULNA SHORTENING OSTEOTOMY;  Surgeon: Daryll Brod, MD;  Location: [facility];  Service: Orthopedics;  Laterality: Right;  ? ? ?MEDICATIONS: ? acetaminophen (TYLENOL) 500 MG tablet  ? Ascorbic Acid (VITAMIN C) 1000 MG tablet  ? B Complex-C (SUPER B COMPLEX PO)  ? benzonatate (TESSALON) 100 MG capsule  ? cetirizine (ZYRTEC) 10 MG tablet  ? levocetirizine (XYZAL) 5 MG tablet  ? omeprazole (PRILOSEC) 40 MG capsule  ? oxyCODONE (OXY IR/ROXICODONE) 5 MG immediate release tablet  ? promethazine-dextromethorphan (PROMETHAZINE-DM) 6.25-15 MG/5ML syrup  ? pseudoephedrine (SUDAFED) 60 MG tablet  ? rosuvastatin (CRESTOR) 20 MG tablet  ? tiZANidine (  ZANAFLEX) 4 MG tablet  ? Vitamin D, Ergocalciferol, (DRISDOL) 1.25 MG (50000 UNIT) CAPS capsule  ? ?No current facility-administered medications for this encounter.  ? ? ?Myra Gianotti, PA-C ?Surgical Short Stay/Anesthesiology ?[facility] Phone [phone] ?[facility] Phone [phone] ?03/10/2022 7:00 PM ? ? ? ? ? ? ? ?

## 2022-03-17 ENCOUNTER — Other Ambulatory Visit: Payer: Self-pay

## 2022-03-17 ENCOUNTER — Encounter (HOSPITAL_COMMUNITY)
Admission: RE | Disposition: A | Discharge status: Home / Self Care | Payer: Self-pay | Source: Ambulatory Visit | Attending: General Surgery

## 2022-03-17 ENCOUNTER — Encounter (HOSPITAL_COMMUNITY): Payer: Self-pay | Admitting: General Surgery

## 2022-03-17 ENCOUNTER — Ambulatory Visit (HOSPITAL_COMMUNITY)
Admission: RE | Admit: 2022-03-17 | Discharge: 2022-03-17 | Disposition: A | Discharge status: Home / Self Care | Payer: BC Managed Care – PPO | Source: Ambulatory Visit | Attending: General Surgery | Admitting: General Surgery

## 2022-03-17 ENCOUNTER — Ambulatory Visit (HOSPITAL_COMMUNITY): Payer: BC Managed Care – PPO | Admitting: Certified Registered"

## 2022-03-17 ENCOUNTER — Ambulatory Visit (HOSPITAL_COMMUNITY): Payer: BC Managed Care – PPO | Admitting: Vascular Surgery

## 2022-03-17 DIAGNOSIS — K4001 Bilateral inguinal hernia, with obstruction, without gangrene, recurrent: Secondary | ICD-10-CM | POA: Diagnosis not present

## 2022-03-17 DIAGNOSIS — K429 Umbilical hernia without obstruction or gangrene: Secondary | ICD-10-CM | POA: Diagnosis not present

## 2022-03-17 DIAGNOSIS — K4031 Unilateral inguinal hernia, with obstruction, without gangrene, recurrent: Secondary | ICD-10-CM | POA: Diagnosis not present

## 2022-03-17 DIAGNOSIS — D176 Benign lipomatous neoplasm of spermatic cord: Secondary | ICD-10-CM | POA: Insufficient documentation

## 2022-03-17 DIAGNOSIS — K219 Gastro-esophageal reflux disease without esophagitis: Secondary | ICD-10-CM | POA: Insufficient documentation

## 2022-03-17 DIAGNOSIS — K430 Incisional hernia with obstruction, without gangrene: Secondary | ICD-10-CM | POA: Insufficient documentation

## 2022-03-17 DIAGNOSIS — K4030 Unilateral inguinal hernia, with obstruction, without gangrene, not specified as recurrent: Secondary | ICD-10-CM | POA: Insufficient documentation

## 2022-03-17 DIAGNOSIS — K4021 Bilateral inguinal hernia, without obstruction or gangrene, recurrent: Secondary | ICD-10-CM | POA: Diagnosis not present

## 2022-03-17 DIAGNOSIS — K4091 Unilateral inguinal hernia, without obstruction or gangrene, recurrent: Secondary | ICD-10-CM | POA: Diagnosis not present

## 2022-03-17 DIAGNOSIS — K432 Incisional hernia without obstruction or gangrene: Secondary | ICD-10-CM | POA: Diagnosis not present

## 2022-03-17 DIAGNOSIS — K4090 Unilateral inguinal hernia, without obstruction or gangrene, not specified as recurrent: Secondary | ICD-10-CM | POA: Diagnosis not present

## 2022-03-17 HISTORY — PX: INCISIONAL HERNIA REPAIR: SHX193

## 2022-03-17 HISTORY — PX: INSERTION OF MESH: SHX5868

## 2022-03-17 SURGERY — REPAIR, HERNIA, INGUINAL, BILATERAL, ROBOT-ASSISTED
Anesthesia: General | Site: Inguinal

## 2022-03-17 MED ORDER — SUGAMMADEX SODIUM 200 MG/2ML IV SOLN
INTRAVENOUS | Status: DC | PRN
  Administered 2022-03-17: 200 mg via INTRAVENOUS

## 2022-03-17 MED ORDER — ENSURE PRE-SURGERY PO LIQD: 296.0000 mL | Freq: Once | ORAL | Status: DC

## 2022-03-17 MED ORDER — FENTANYL CITRATE (PF) 100 MCG/2ML IJ SOLN
25.0000 ug | INTRAMUSCULAR | Status: DC | PRN
  Administered 2022-03-17: 50 ug via INTRAVENOUS
  Administered 2022-03-17 (×2): 25 ug via INTRAVENOUS

## 2022-03-17 MED ORDER — PHENYLEPHRINE HCL-NACL 20-0.9 MG/250ML-% IV SOLN
INTRAVENOUS | Status: DC | PRN
  Administered 2022-03-17: 25 ug/min via INTRAVENOUS

## 2022-03-17 MED ORDER — ROCURONIUM BROMIDE 10 MG/ML (PF) SYRINGE
PREFILLED_SYRINGE | INTRAVENOUS | Status: DC | PRN
  Administered 2022-03-17: 20 mg via INTRAVENOUS
  Administered 2022-03-17: 50 mg via INTRAVENOUS

## 2022-03-17 MED ORDER — AMISULPRIDE (ANTIEMETIC) 5 MG/2ML IV SOLN: 10.0000 mg | Freq: Once | INTRAVENOUS | Status: DC | PRN

## 2022-03-17 MED ORDER — LACTATED RINGERS IV SOLN: INTRAVENOUS | Status: DC

## 2022-03-17 MED ORDER — MIDAZOLAM HCL 5 MG/5ML IJ SOLN
INTRAMUSCULAR | Status: DC | PRN
  Administered 2022-03-17: 2 mg via INTRAVENOUS

## 2022-03-17 MED ORDER — ORAL CARE MOUTH RINSE: 15.0000 mL | Freq: Once | OROMUCOSAL | Status: AC

## 2022-03-17 MED ORDER — PROPOFOL 10 MG/ML IV BOLUS
INTRAVENOUS | Status: AC
  Filled 2022-03-17: qty 20

## 2022-03-17 MED ORDER — LIDOCAINE 2% (20 MG/ML) 5 ML SYRINGE
INTRAMUSCULAR | Status: DC | PRN
  Administered 2022-03-17: 60 mg via INTRAVENOUS

## 2022-03-17 MED ORDER — DEXAMETHASONE SODIUM PHOSPHATE 10 MG/ML IJ SOLN
INTRAMUSCULAR | Status: DC | PRN
  Administered 2022-03-17: 10 mg via INTRAVENOUS

## 2022-03-17 MED ORDER — BUPIVACAINE HCL 0.25 % IJ SOLN
INTRAMUSCULAR | Status: DC | PRN
  Administered 2022-03-17: 10 mL

## 2022-03-17 MED ORDER — LACTATED RINGERS IV SOLN: INTRAVENOUS | Status: DC | PRN

## 2022-03-17 MED ORDER — ACETAMINOPHEN 500 MG PO TABS
1000.0000 mg | ORAL_TABLET | ORAL | Status: AC
  Administered 2022-03-17: 1000 mg via ORAL
  Filled 2022-03-17: qty 2

## 2022-03-17 MED ORDER — CHLORHEXIDINE GLUCONATE CLOTH 2 % EX PADS: 6.0000 | MEDICATED_PAD | Freq: Once | CUTANEOUS | Status: DC

## 2022-03-17 MED ORDER — TRAMADOL HCL 50 MG PO TABS: 50.0000 mg | ORAL_TABLET | Freq: Four times a day (QID) | ORAL | 0 refills | Status: AC | PRN

## 2022-03-17 MED ORDER — FENTANYL CITRATE (PF) 250 MCG/5ML IJ SOLN
INTRAMUSCULAR | Status: DC | PRN
  Administered 2022-03-17: 100 ug via INTRAVENOUS
  Administered 2022-03-17: 50 ug via INTRAVENOUS

## 2022-03-17 MED ORDER — ONDANSETRON HCL 4 MG/2ML IJ SOLN
INTRAMUSCULAR | Status: DC | PRN
  Administered 2022-03-17: 4 mg via INTRAVENOUS

## 2022-03-17 MED ORDER — PROPOFOL 10 MG/ML IV BOLUS
INTRAVENOUS | Status: DC | PRN
  Administered 2022-03-17: 200 mg via INTRAVENOUS

## 2022-03-17 MED ORDER — SUCCINYLCHOLINE CHLORIDE 200 MG/10ML IV SOSY
PREFILLED_SYRINGE | INTRAVENOUS | Status: DC | PRN
  Administered 2022-03-17: 120 mg via INTRAVENOUS

## 2022-03-17 MED ORDER — 0.9 % SODIUM CHLORIDE (POUR BTL) OPTIME
TOPICAL | Status: DC | PRN
  Administered 2022-03-17: 1000 mL

## 2022-03-17 MED ORDER — ONDANSETRON HCL 4 MG/2ML IJ SOLN
INTRAMUSCULAR | Status: AC
  Filled 2022-03-17: qty 2

## 2022-03-17 MED ORDER — ENSURE PRE-SURGERY PO LIQD: 592.0000 mL | Freq: Once | ORAL | Status: DC

## 2022-03-17 MED ORDER — MIDAZOLAM HCL 2 MG/2ML IJ SOLN
INTRAMUSCULAR | Status: AC
  Filled 2022-03-17: qty 2

## 2022-03-17 MED ORDER — FENTANYL CITRATE (PF) 250 MCG/5ML IJ SOLN
INTRAMUSCULAR | Status: AC
  Filled 2022-03-17: qty 5

## 2022-03-17 MED ORDER — KETOROLAC TROMETHAMINE 30 MG/ML IJ SOLN
INTRAMUSCULAR | Status: AC
  Filled 2022-03-17: qty 1

## 2022-03-17 MED ORDER — LIDOCAINE 2% (20 MG/ML) 5 ML SYRINGE
INTRAMUSCULAR | Status: AC
  Filled 2022-03-17: qty 5

## 2022-03-17 MED ORDER — BUPIVACAINE HCL (PF) 0.25 % IJ SOLN
INTRAMUSCULAR | Status: AC
  Filled 2022-03-17: qty 30

## 2022-03-17 MED ORDER — DEXAMETHASONE SODIUM PHOSPHATE 10 MG/ML IJ SOLN
INTRAMUSCULAR | Status: AC
  Filled 2022-03-17: qty 1

## 2022-03-17 MED ORDER — CEFAZOLIN SODIUM-DEXTROSE 2-4 GM/100ML-% IV SOLN
2.0000 g | INTRAVENOUS | Status: AC
  Administered 2022-03-17: 2 g via INTRAVENOUS
  Filled 2022-03-17: qty 100

## 2022-03-17 MED ORDER — FENTANYL CITRATE (PF) 100 MCG/2ML IJ SOLN
INTRAMUSCULAR | Status: AC
  Filled 2022-03-17: qty 2

## 2022-03-17 MED ORDER — PHENYLEPHRINE 80 MCG/ML (10ML) SYRINGE FOR IV PUSH (FOR BLOOD PRESSURE SUPPORT)
PREFILLED_SYRINGE | INTRAVENOUS | Status: DC | PRN
  Administered 2022-03-17 (×2): 80 ug via INTRAVENOUS

## 2022-03-17 MED ORDER — SODIUM CHLORIDE 0.9 % IV SOLN
INTRAVENOUS | Status: DC | PRN
  Administered 2022-03-17: 40 mL

## 2022-03-17 MED ORDER — ROCURONIUM BROMIDE 10 MG/ML (PF) SYRINGE
PREFILLED_SYRINGE | INTRAVENOUS | Status: AC
  Filled 2022-03-17: qty 10

## 2022-03-17 MED ORDER — CHLORHEXIDINE GLUCONATE 0.12 % MT SOLN
15.0000 mL | Freq: Once | OROMUCOSAL | Status: AC
  Administered 2022-03-17: 15 mL via OROMUCOSAL
  Filled 2022-03-17: qty 15

## 2022-03-17 MED ORDER — BUPIVACAINE LIPOSOME 1.3 % IJ SUSP
INTRAMUSCULAR | Status: AC
  Filled 2022-03-17: qty 20

## 2022-03-17 MED ORDER — KETOROLAC TROMETHAMINE 30 MG/ML IJ SOLN
INTRAMUSCULAR | Status: DC | PRN
  Administered 2022-03-17: 30 mg via INTRAVENOUS

## 2022-03-17 SURGICAL SUPPLY — 93 items
ADH SKN CLS APL DERMABOND .7 (GAUZE/BANDAGES/DRESSINGS) ×4
APL LAPSCP 35 DL APL RGD (MISCELLANEOUS)
APL PRP STRL LF DISP 70% ISPRP (MISCELLANEOUS) ×4
APPLICATOR VISTASEAL 35 (MISCELLANEOUS) IMPLANT
BAG COUNTER SPONGE SURGICOUNT (BAG) ×5 IMPLANT
BAG SPNG CNTER NS LX DISP (BAG) ×4
BINDER ABDOMINAL 10 UNV 27-48 (MISCELLANEOUS) IMPLANT
BINDER ABDOMINAL 12 ML 46-62 (SOFTGOODS) IMPLANT
BLADE CLIPPER SURG (BLADE) IMPLANT
CANISTER SUCT 3000ML PPV (MISCELLANEOUS) IMPLANT
CHLORAPREP W/TINT 26 (MISCELLANEOUS) ×5 IMPLANT
COVER MAYO STAND STRL (DRAPES) ×5 IMPLANT
COVER SURGICAL LIGHT HANDLE (MISCELLANEOUS) ×5 IMPLANT
COVER TIP SHEARS 8 DVNC (MISCELLANEOUS) ×4 IMPLANT
COVER TIP SHEARS 8MM DA VINCI (MISCELLANEOUS) ×5
DECANTER SPIKE VIAL GLASS SM (MISCELLANEOUS) ×5 IMPLANT
DEFOGGER SCOPE WARMER CLEARIFY (MISCELLANEOUS) ×5 IMPLANT
DERMABOND ADVANCED (GAUZE/BANDAGES/DRESSINGS) ×1
DERMABOND ADVANCED .7 DNX12 (GAUZE/BANDAGES/DRESSINGS) ×6 IMPLANT
DEVICE SECURE STRAP 25 ABSORB (INSTRUMENTS) ×3 IMPLANT
DEVICE TROCAR PUNCTURE CLOSURE (ENDOMECHANICALS) ×5 IMPLANT
DRAIN PENROSE 1/2X12 LTX STRL (WOUND CARE) IMPLANT
DRAPE ARM DVNC X/XI (DISPOSABLE) ×16 IMPLANT
DRAPE COLUMN DVNC XI (DISPOSABLE) ×4 IMPLANT
DRAPE CV SPLIT W-CLR ANES SCRN (DRAPES) ×5 IMPLANT
DRAPE DA VINCI XI ARM (DISPOSABLE) ×20
DRAPE DA VINCI XI COLUMN (DISPOSABLE) ×5
DRAPE LAPAROTOMY TRNSV 102X78 (DRAPES) ×2 IMPLANT
DRAPE ORTHO SPLIT 77X108 STRL (DRAPES) ×5
DRAPE SURG ORHT 6 SPLT 77X108 (DRAPES) ×4 IMPLANT
ELECT REM PT RETURN 9FT ADLT (ELECTROSURGICAL) ×5
ELECTRODE REM PT RTRN 9FT ADLT (ELECTROSURGICAL) ×4 IMPLANT
GLOVE BIO SURGEON STRL SZ7.5 (GLOVE) ×10 IMPLANT
GLOVE BIOGEL PI IND STRL 8 (GLOVE) ×2 IMPLANT
GLOVE BIOGEL PI INDICATOR 8 (GLOVE)
GLOVE PROTEXIS LATEX SZ 7.5 (GLOVE) IMPLANT
GLOVE SURG LATEX 7.5 PF (GLOVE) ×4 IMPLANT
GLOVE SURG SYN 7.5  E (GLOVE)
GLOVE SURG SYN 7.5 E (GLOVE) IMPLANT
GLOVE SURG SYN 7.5 PF PI (GLOVE) ×2 IMPLANT
GOWN STRL REUS W/ TWL LRG LVL3 (GOWN DISPOSABLE) ×8 IMPLANT
GOWN STRL REUS W/ TWL XL LVL3 (GOWN DISPOSABLE) ×8 IMPLANT
GOWN STRL REUS W/TWL 2XL LVL3 (GOWN DISPOSABLE) ×5 IMPLANT
GOWN STRL REUS W/TWL LRG LVL3 (GOWN DISPOSABLE) ×10
GOWN STRL REUS W/TWL XL LVL3 (GOWN DISPOSABLE) ×10
IRRIG SUCT STRYKERFLOW 2 WTIP (MISCELLANEOUS)
IRRIGATION SUCT STRKRFLW 2 WTP (MISCELLANEOUS) IMPLANT
KIT BASIN OR (CUSTOM PROCEDURE TRAY) ×5 IMPLANT
KIT TURNOVER KIT B (KITS) ×5 IMPLANT
MARKER SKIN DUAL TIP RULER LAB (MISCELLANEOUS) ×5 IMPLANT
MESH PROGRIP LAP SELF FIXATING (Mesh General) ×10 IMPLANT
MESH PROGRIP LAP SLF FIX 16X12 (Mesh General) ×4 IMPLANT
MESH VENTRALIGHT ST 4.5IN (Mesh General) ×3 IMPLANT
NDL HYPO 25GX1X1/2 BEV (NEEDLE) ×2 IMPLANT
NEEDLE HYPO 22GX1.5 SAFETY (NEEDLE) ×5 IMPLANT
NEEDLE HYPO 25GX1X1/2 BEV (NEEDLE) ×5 IMPLANT
NS IRRIG 1000ML POUR BTL (IV SOLUTION) ×5 IMPLANT
PACK GENERAL/GYN (CUSTOM PROCEDURE TRAY) ×2 IMPLANT
PAD ARMBOARD 7.5X6 YLW CONV (MISCELLANEOUS) ×10 IMPLANT
PENCIL SMOKE EVACUATOR (MISCELLANEOUS) ×5 IMPLANT
SCISSORS LAP 5X35 DISP (ENDOMECHANICALS) IMPLANT
SEAL CANN UNIV 5-8 DVNC XI (MISCELLANEOUS) ×12 IMPLANT
SEAL XI 5MM-8MM UNIVERSAL (MISCELLANEOUS) ×15
SET TUBE SMOKE EVAC HIGH FLOW (TUBING) ×5 IMPLANT
SPONGE INTESTINAL PEANUT (DISPOSABLE) IMPLANT
STOPCOCK 4 WAY LG BORE MALE ST (IV SETS) ×5 IMPLANT
SUT CHROMIC 2 0 SH (SUTURE) ×3 IMPLANT
SUT DVC VLOC 180 0 12IN GS21 (SUTURE)
SUT ETHIBOND 0 36 GRN (SUTURE) ×3 IMPLANT
SUT MNCRL AB 4-0 PS2 18 (SUTURE) ×7 IMPLANT
SUT PROLENE 2 0 CT2 30 (SUTURE) ×8 IMPLANT
SUT SILK 2 0 (SUTURE)
SUT SILK 2 0 SH (SUTURE) IMPLANT
SUT SILK 2-0 18XBRD TIE 12 (SUTURE) ×4 IMPLANT
SUT VIC AB 2-0 CT1 27 (SUTURE)
SUT VIC AB 2-0 CT1 TAPERPNT 27 (SUTURE) ×4 IMPLANT
SUT VIC AB 3-0 SH 27 (SUTURE)
SUT VIC AB 3-0 SH 27XBRD (SUTURE) ×4 IMPLANT
SUT VICRYL AB 2 0 TIES (SUTURE) ×2 IMPLANT
SUT VLOC 180 0 6IN GS21 (SUTURE) ×6 IMPLANT
SUT VLOC 180 0 9IN  GS21 (SUTURE)
SUT VLOC 180 0 9IN GS21 (SUTURE) IMPLANT
SUT VLOC 180 2-0 6IN GS21 (SUTURE) ×2 IMPLANT
SUT VLOC 180 2-0 9IN GS21 (SUTURE) IMPLANT
SUTURE DVC VLC 180 0 12IN GS21 (SUTURE) IMPLANT
SYR CONTROL 10ML LL (SYRINGE) ×5 IMPLANT
SYR TOOMEY 50ML (SYRINGE) ×2 IMPLANT
TOWEL GREEN STERILE (TOWEL DISPOSABLE) ×5 IMPLANT
TOWEL GREEN STERILE FF (TOWEL DISPOSABLE) ×5 IMPLANT
TRAY FOLEY W/BAG SLVR 16FR (SET/KITS/TRAYS/PACK)
TRAY FOLEY W/BAG SLVR 16FR ST (SET/KITS/TRAYS/PACK) ×2 IMPLANT
TRAY LAPAROSCOPIC MC (CUSTOM PROCEDURE TRAY) ×5 IMPLANT
TROCAR XCEL NON-BLD 5MMX100MML (ENDOMECHANICALS) ×3 IMPLANT

## 2022-03-17 NOTE — Interval H&P Note (Signed)
History and Physical Interval Note: ? ?03/17/2022 ?7:19 AM ? ?[name]  has presented today for surgery, with the diagnosis of INCISIONAL HERNIA, LEFT INGUINAL HERNIA, RECURRENT RIGHT INGUINAL HERNIA.  The various methods of treatment have been discussed with the patient and family. After consideration of risks, benefits and other options for treatment, the patient has consented to  Procedure(s): ?XI ROBOTIC [place] (N/A) ?BILATERAL INGUINAL HERNIA REPAIR WITH MESH (Bilateral) as a surgical intervention.  The patient's history has been reviewed, patient examined, no change in status, stable for surgery.  I have reviewed the patient's chart and labs.  Questions were answered to the patient's satisfaction.   ? ? ?[name] ? ? ?

## 2022-03-17 NOTE — Discharge Instructions (Signed)
[facility] _______[facility]  HERNIA REPAIR: POST OP INSTRUCTIONS  Always review your discharge instruction sheet given to you by the facility where your surgery was performed. IF YOU HAVE DISABILITY OR FAMILY LEAVE FORMS, YOU MUST BRING THEM TO THE OFFICE FOR PROCESSING.   DO NOT GIVE THEM TO YOUR DOCTOR.  1. A  prescription for pain medication may be given to you upon discharge.  Take your pain medication as prescribed, if needed.  If narcotic pain medicine is not needed, then you may take acetaminophen (Tylenol) or ibuprofen (Advil) as needed. 2. Take your usually prescribed medications unless otherwise directed. If you need a refill on your pain medication, please contact your pharmacy.  They will contact our office to request authorization. Prescriptions will not be filled after 5 pm or on week-ends. 3. You should follow a light diet the first 24 hours after arrival home, such as soup and crackers, etc.  Be sure to include lots of fluids daily.  Resume your normal diet the day after surgery. 4.Most patients will experience some swelling and bruising around the umbilicus or in the groin and scrotum.  Ice packs and reclining will help.  Swelling and bruising can take several days to resolve.  6. It is common to experience some constipation if taking pain medication after surgery.  Increasing fluid intake and taking a stool softener (such as Colace) will usually help or prevent this problem from occurring.  A mild laxative (Milk of Magnesia or Miralax) should be taken according to package directions if there are no bowel movements after 48 hours. 7. Unless discharge instructions indicate otherwise, you may remove your bandages 24-48 hours after surgery, and you may shower at that time.  You may have steri-strips (small skin tapes) in place directly over the incision.  These strips should be left on the skin for 7-10 days.  If your surgeon used skin glue on the incision, you may shower in  24 hours.  The glue will flake off over the next 2-3 weeks.  Any sutures or staples will be removed at the office during your follow-up visit. 8. ACTIVITIES:  You may resume regular (light) daily activities beginning the next day--such as daily self-care, walking, climbing stairs--gradually increasing activities as tolerated.  You may have sexual intercourse when it is comfortable.  Refrain from any heavy lifting or straining until approved by your doctor.  a.You may drive when you are no longer taking prescription pain medication, you can comfortably wear a seatbelt, and you can safely maneuver your car and apply brakes. b.RETURN TO WORK:   _____________________________________________  9.You should see your doctor in the office for a follow-up appointment approximately 2-3 weeks after your surgery.  Make sure that you call for this appointment within a day or two after you arrive home to insure a convenient appointment time. 10.OTHER INSTRUCTIONS: _________________________    _____________________________________  WHEN TO CALL YOUR DOCTOR: Fever over 101.0 Inability to urinate Nausea and/or vomiting Extreme swelling or bruising Continued bleeding from incision. Increased pain, redness, or drainage from the incision  The clinic staff is available to answer your questions during regular business hours.  Please don't hesitate to call and ask to speak to one of the nurses for clinical concerns.  If you have a medical emergency, go to the nearest emergency room or call 911.  A surgeon from [facility] is always on call at the hospital   [address], [address], [place], [place]    [address] ?  [address] [address], [place], [place]   [address] [phone] ? [phone] ? FAX [phone] Web site: [email or web address]  

## 2022-03-17 NOTE — Transfer of Care (Signed)
Immediate Anesthesia Transfer of Care Note ? ?Patient: [name] ? ?Procedure(s) Performed: XI ROBOTIC ASSISTED BILATERAL INGUINAL HERNIA REPAIR (Bilateral: Inguinal) ?LAPAROSCOPIC VENTRAL HERNIA REPAIR (Abdomen) ?INSERTION OF MESH (Bilateral: Inguinal) ? ?Patient Location: PACU ? ?Anesthesia Type:General ? ?Level of Consciousness: awake, drowsy and patient cooperative ? ?Airway & Oxygen Therapy: Patient Spontanous Breathing and Patient connected to nasal cannula oxygen ? ?Post-op Assessment: Report given to RN, Post -op Vital signs reviewed and stable and Patient moving all extremities X 4 ? ?Post vital signs: Reviewed and stable ? ?Last Vitals:  ?Vitals Value Taken Time  ?BP    ?Temp    ?Pulse 70 03/17/22 0958  ?Resp 21 03/17/22 0958  ?SpO2 100 % 03/17/22 0958  ?Vitals shown include unvalidated device data. ? ?Last Pain:  ?Vitals:  ? 03/17/22 0550  ?TempSrc: Oral  ?   ? ?Patients Stated Pain Goal: 1 (03/17/22 3244) ? ?Complications: No notable events documented. ?

## 2022-03-17 NOTE — Op Note (Addendum)
03/17/2022 ? ?9:41 AM ? ?PATIENT:  [name]  61 y.o. male ? ?PRE-OPERATIVE DIAGNOSIS:  INCISIONAL HERNIA, RECURRENT BILATERAL INGUINAL HERNIA ? ?POST-OPERATIVE DIAGNOSIS:  INCISIONAL HERNIA, INCARCERATED BILATERAL HERNIA, RECURRENT RIGHT INGUINAL HERNIA ? ?PROCEDURE:  Procedure(s): ?XI ROBOTIC ASSISTED BILATERAL INGUINAL HERNIA REPAIR (Bilateral) ?LAPAROSCOPIC VENTRAL HERNIA REPAIR (N/A) ?INSERTION OF MESH (Bilateral) ?ADHESIOLYSIS x 70mn ? ?SURGEON:  Surgeon(s) and Role: ?   *[name] MD - Primary ? ?ASSISTANTS: PPryor Curia RNFA  ? ?ANESTHESIA:   local and general ? ?EBL:  minimal  ? ?BLOOD ADMINISTERED:none ? ?DRAINS: none  ? ?LOCAL MEDICATIONS USED:  BUPIVICAINE & exparel ? ?SPECIMEN:  No Specimen ? ?DISPOSITION OF SPECIMEN:  N/A ? ?COUNTS:  YES ? ?TOURNIQUET:  * No tourniquets in log * ? ?DICTATION: .Dragon Dictation ? ?Details of the procedure:  ?Indication procedure: ?Patient is a 61year old male with history of recurrent right-sided inguinal hernia, umbilical hernia and left-sided hernia.  Patient was counseled on clinic and decided to have this electively repaired. ? ? ?Findings: ?Patient has a benign small bowel patient to intra-abdominal.  These were taken down sharply without any cautery.  Adhesiolysis was approximately 35 minutes.  Patient continued umbilical hernia.  Patient had incarcerated bilateral inguinal hernias that were indirect. ? ?Details of procedure: ?The patient was taken back to the operating room. The patient was placed in supine position with bilateral SCDs in place.   The patient was prepped and draped in the usual sterile fashion.  After appropriate anitbiotics were confirmed, a time-out was confirmed and all facts were verified. ? ?At this time a Veress needle technique was used to inspect the abdomen approximately 10 cm from the valgus and the paramedian line. This time a 8 mm robotic trocar was placed into the abdomen. The camera was placed there was no injury to  any intra-abdominal organs.  At this time it was apparent there was a significant mount of small bowel adhesions to the anterior abdominal.  The use of her taken down sharply after a 5 mm trocar placed in the left side costal margin.  The small bowel adhesions were fairly dense.  Adhesiolysis was approximate 35 minutes.  A 854mumbilical port was placed just superior to the umbilicus. An 8 mm port was placed approximately 10 cm lateral to the umbilicus on the left paramedian side.  ? ?Robot was positioned over the patient and the ports were docked in the usual fashion. ? ?At this time the left-sided peritoneum was taken down from the medial umbilical ligament laterally. The pre-peritoneal space was entered. Dissection was taken down to Cooper's ligament. At this time it was apparent there was an indirect hernia. The hernia contained mainly preperitoneal fat and what appeared to be sigmoid colon.. At this time proceeded clean out the rest Cooper's ligament and the medial to lateral direction. A proceeded laterally to dissect the spermatic cord. The spermatic cord was circumferentially dissected away from the surrounding musculature and tissue. The vas deferens was identified and protected all portions of the case. There was a tiny indirect hernia at the base of the spermatic cord. This was dissected back. At this time I proceeded to create a pocket laterally for the mesh. Once the pocket was created the peritoneum was stripped back to approximately the base of the cord. At this time the piece of Progrip 16x12cm mesh was in placed into the dissected area. This covered both the direct and indirect spaces appropriately. This also covered  The femoral space.  The mesh lay flat from medial to lateral. At this time a 2 V- lock stitch was used to close the peritoneum in a standard running fashion. ? ?The right-sided peritoneum was then taken down from the anterior abdominal wall.  This was done from a lateral medial  direction.  Dissection was taken down to Cooper's ligament.  This was dissected away from surrounding tissue.  It was evident there was a indirect hernia.  There is no direct component.  The hernia was then circumferentially dissected away from the cord structures.  The vas deferens was identified and protected all portion of the case.  At this time it was apparent there was a large cord lipoma.  This was dissected back.  The peritoneum was dissected back and down clearway for the mesh.  Laterally a pocket was created for the mesh. ?At this time a piece of ProGrip mesh, 16 x 12 cm was placed into the dissected area.  This covered the direct, indirect, and femoral spaces appropriately.  At this time a 2 -0 V-Loc was used to reapproximate the peritoneum in a standard running fashion. ? ?At this time the robot was undocked.  At this time the umbilical hernia was reapproximated using Ethibond in an interrupted fashion x2.  A piece of 11 cm Bard ST light mesh was brought up to the anterior abdominal wall.  This was circumferentially tacked in a double crown fashion using a secure strap.  The left-sided 8 mm trocar was reapproximated using 0 Vicryl and Endo Close device. ? ?All ports were removed. The skin was reapproximated and all port sites using 4-0 Monocryl subcuticular fashion. ? ?The patient the procedure well was taken to the recovery  ?Hernia Details ? ? ?Primary Hernia and Incarcerated Hernia, < 3cm ? ? ?PLAN OF CARE: Discharge to home after PACU ? ?PATIENT DISPOSITION:  PACU - hemodynamically stable. ?  ?Delay start of Pharmacological VTE agent (>24hrs) due to surgical blood loss or risk of bleeding: not applicable ? ?

## 2022-03-17 NOTE — Anesthesia Procedure Notes (Signed)
Procedure Name: Intubation ?Date/Time: 03/17/2022 7:33 AM ?Performed by: Gaylene Brooks, CRNA ?Pre-anesthesia Checklist: Patient identified, Emergency Drugs available, Suction available and Patient being monitored ?Patient Re-evaluated:Patient Re-evaluated prior to induction ?Oxygen Delivery Method: Circle System Utilized ?Preoxygenation: Pre-oxygenation with 100% oxygen ?Induction Type: IV induction ?Ventilation: Mask ventilation without difficulty and Oral airway inserted - appropriate to patient size ?Laryngoscope Size: Glidescope and 3 ?Grade View: Grade II ?Tube type: Oral ?Tube size: 7.5 mm ?Number of attempts: 1 ?Airway Equipment and Method: Stylet and Oral airway ?Placement Confirmation: ETT inserted through vocal cords under direct vision, positive ETCO2 and breath sounds checked- equal and bilateral ?Secured at: 24 cm ?Tube secured with: Tape ?Dental Injury: Teeth and Oropharynx as per pre-operative assessment  ?Difficulty Due To: Difficulty was anticipated, Difficult Airway- due to reduced neck mobility, Difficult Airway- due to limited oral opening and Difficult Airway- due to anterior larynx ?Comments: Glidescope used due to history of difficult intubation. Grade 2 view when cricoid applied. ETT passed without trauma to airway. +ETCO2, BBS=. ? ? ? ? ?

## 2022-03-18 ENCOUNTER — Encounter (HOSPITAL_COMMUNITY): Payer: Self-pay | Admitting: General Surgery

## 2022-03-18 NOTE — Anesthesia Postprocedure Evaluation (Signed)
Anesthesia Post Note ? ?Patient: [name] ? ?Procedure(s) Performed: XI ROBOTIC ASSISTED BILATERAL INGUINAL HERNIA REPAIR (Bilateral: Inguinal) ?LAPAROSCOPIC VENTRAL HERNIA REPAIR (Abdomen) ?INSERTION OF MESH (Bilateral: Inguinal) ? ?  ? ?Patient location during evaluation: PACU ?Anesthesia Type: General ?Level of consciousness: awake and alert ?Pain management: pain level controlled ?Vital Signs Assessment: post-procedure vital signs reviewed and stable ?Respiratory status: spontaneous breathing, nonlabored ventilation, respiratory function stable and patient connected to nasal cannula oxygen ?Cardiovascular status: blood pressure returned to baseline and stable ?Postop Assessment: no apparent nausea or vomiting ?Anesthetic complications: no ? ? ?No notable events documented. ? ?Last Vitals:  ?Vitals:  ? 03/17/22 1030 03/17/22 1040  ?BP: 131/80 128/78  ?Pulse: 64 92  ?Resp: 11 13  ?Temp:  36.5 ?C  ?SpO2: 98% 96%  ?  ?Last Pain:  ?Vitals:  ? 03/17/22 1040  ?TempSrc:   ?PainSc: 4   ? ? ?  ?  ?  ?  ?  ?  ? ?Suzette Battiest E ? ? ? ? ?

## 2022-07-01 ENCOUNTER — Encounter (INDEPENDENT_AMBULATORY_CARE_PROVIDER_SITE_OTHER): Payer: Self-pay

## 2022-09-23 DIAGNOSIS — R42 Dizziness and giddiness: Secondary | ICD-10-CM | POA: Diagnosis not present

## 2022-09-23 DIAGNOSIS — E663 Overweight: Secondary | ICD-10-CM | POA: Diagnosis not present

## 2022-09-23 DIAGNOSIS — N528 Other male erectile dysfunction: Secondary | ICD-10-CM | POA: Diagnosis not present

## 2022-09-23 DIAGNOSIS — Z6828 Body mass index (BMI) 28.0-28.9, adult: Secondary | ICD-10-CM | POA: Diagnosis not present

## 2022-09-25 DIAGNOSIS — R232 Flushing: Secondary | ICD-10-CM | POA: Diagnosis not present

## 2022-09-28 DIAGNOSIS — G8929 Other chronic pain: Secondary | ICD-10-CM | POA: Diagnosis not present

## 2022-09-28 DIAGNOSIS — R519 Headache, unspecified: Secondary | ICD-10-CM | POA: Diagnosis not present

## 2022-10-20 NOTE — Progress Notes (Unsigned)
Cardiology Office Note:    Date:  10/20/2022   ID:  [name], DOB [birth year], MRN [id number]  PCP:  Redmond School, MD   [place] Providers Cardiologist:  None   Referring MD: Redmond School, MD     History of Present Illness:    [name] is a 61 y.o. male with a hx of GERD, aortic atherosclerosis, and HLD who was referred by Dr. Gerarda Fraction for diaphoresis on episodes of dizziness.  Today, ***  Past Medical History:  Diagnosis Date   Allergy    Back pain    Chewing difficulty    Difficult intubation    GERD (gastroesophageal reflux disease)    Hearing loss    Lordosis of cervicothoracic region    Muscle weakness (generalized)    Other fatigue    Pectus excavatum    Poor short term memory    Shortness of breath on exertion    Swallowing difficulty    Tinnitus     Past Surgical History:  Procedure Laterality Date   ANTERIOR CERVICAL DISCECTOMY  11/23/1997   BACK SURGERY     CERVICAL FUSION  2010, 2012   COLONOSCOPY  08/2014   Dr.Stark   COLONOSCOPY WITH PROPOFOL N/A 09/23/2021   Procedure: COLONOSCOPY WITH PROPOFOL;  Surgeon: Ladene Artist, MD;  Location: [facility];  Service: Endoscopy;  Laterality: N/A;   INCISIONAL HERNIA REPAIR N/A 03/17/2022   Procedure: LAPAROSCOPIC VENTRAL HERNIA REPAIR;  Surgeon: [name], MD;  Location: [place];  Service: General;  Laterality: N/A;   INGUINAL HERNIA REPAIR  11/23/1984   right   INSERTION OF MESH Bilateral 03/17/2022   Procedure: INSERTION OF MESH;  Surgeon: [name], MD;  Location: [place];  Service: General;  Laterality: Bilateral;   intestinal obstruction  11/23/2008   OTHER SURGICAL HISTORY  11/23/1964   sternum surgery   POLYPECTOMY  09/23/2021   Procedure: POLYPECTOMY;  Surgeon: Ladene Artist, MD;  Location: [facility];  Service: Endoscopy;;   ribs removed as child  11/23/1965   thornwaldt cyst removed     TONSILLECTOMY  11/23/1966   TRANSURETHRAL RESECTION OF PROSTATE   11/23/2010   ULNA OSTEOTOMY Right 10/09/2014   Procedure: Aleatha Borer SHORTENING;  Surgeon: Daryll Brod, MD;  Location: [place];  Service: Orthopedics;  Laterality: Right;   WRIST ARTHROSCOPY Right 10/09/2014   Procedure: RIGHT WRIST ARTHROSCOPY WITH ULNA SHORTENING OSTEOTOMY;  Surgeon: Daryll Brod, MD;  Location: [place];  Service: Orthopedics;  Laterality: Right;    Current Medications: No outpatient medications have been marked as taking for the 10/21/22 encounter (Appointment) with Freada Bergeron, MD.     Allergies:   Morphine   Social History   Socioeconomic History   Marital status: Married    Spouse name: Jeanett Schlein   Number of children: Not on file   Years of education: Not on file   Highest education level: Not on file  Occupational History   Occupation: not applicable  Tobacco Use   Smoking status: Never   Smokeless tobacco: Never  Vaping Use   Vaping Use: Never used  Substance and Sexual Activity   Alcohol use: Not Currently    Comment: rare   Drug use: No   Sexual activity: Yes    Partners: Female  Other Topics Concern   Not on file  Social History Narrative   Not on file   Social Determinants of Health   Financial Resource Strain: Not on file  Food Insecurity: Not on file  Transportation Needs: Not on file  Physical Activity: Not on file  Stress: Not on file  Social Connections: Not on file     Family History: The patient's ***family history includes Cancer in his father; Colon polyps in his father; Colon polyps (age of onset: 96) in his brother; Obesity in his mother and another family member. There is no history of Stomach cancer, Colon cancer, Rectal cancer, or Pancreatic cancer.  ROS:   Please see the history of present illness.    *** All other systems reviewed and are negative.  EKGs/Labs/Other Studies Reviewed:    The following studies were reviewed today: CT abdomen pelvis 01/2022: FINDINGS: Lower chest:  No acute abnormality. Mild chronic pectus deformity is noted.   Hepatobiliary: No focal liver abnormality is seen. No gallstones, gallbladder wall thickening, or biliary dilatation.   Pancreas: Unremarkable. No pancreatic ductal dilatation or surrounding inflammatory changes.   Spleen: Normal in size without focal abnormality.   Adrenals/Urinary Tract: Adrenal glands are within normal limits bilaterally. Kidneys are visualized without renal calculi or obstructive changes. Mild scarring in the upper pole of the right kidney is noted stable from the prior study. Ureters are within normal limits. The bladder is well distended.   Stomach/Bowel: Minimal diverticular change of the colon is noted. No evidence of diverticulitis is seen. No obstructive or inflammatory changes are noted. The appendix is within normal limits. No obstructive changes of the small bowel are noted. Stomach is within normal limits.   Vascular/Lymphatic: Aortic atherosclerosis. No enlarged abdominal or pelvic lymph nodes. Duplicated IVC is noted.   Reproductive: Prostate is well visualized. Changes of prior urolift procedure are noted.   Other: No free fluid is noted. Small hernia is again identified with a knuckle of small bowel within similar to that noted on prior exam. Postsurgical changes are noted in the left anterior abdominal wall which correspond to the palpable abnormality seen just below the previously described hernia. These changes are new from the prior exam. Right inguinal hernia is noted containing fat. No bowel is seen within. This has increased in the interval from the prior exam. Slightly more prominent fat plane is noted within the left inguinal canal consistent with a small recurrent hernia as well.   Musculoskeletal: Postsurgical changes in the lower lumbar spine are seen. No acute abnormality is noted.   IMPRESSION: Palpable abnormalities correspond to a stable anterior  wall abdominal hernia adjacent to the umbilicus stable from the prior exam. The second left-sided palpable abnormality corresponds to scarring from previous surgery new from the prior exam. The third palpable abnormality in the right lower quadrant corresponds to a fat containing right inguinal hernia. A small recurrent fat containing left inguinal hernia is noted as well.   Diverticulosis without diverticulitis.   Chronic changes similar to that seen on the prior exam.  EKG:  EKG is *** ordered today.  The ekg ordered today demonstrates ***  Recent Labs: 11/27/2021: ALT 25 03/09/2022: BUN 14; Creatinine, Ser 1.15; Hemoglobin 15.3; Platelets 202; Potassium 4.1; Sodium 139  Recent Lipid Panel    Component Value Date/Time   CHOL 227 (H) 11/27/2021 0931   TRIG 83 11/27/2021 0931   HDL 44 11/27/2021 0931   LDLCALC 168 (H) 11/27/2021 0931     Risk Assessment/Calculations:   {Does this patient have ATRIAL FIBRILLATION?:[id number]}  No BP recorded.  {Refresh Note OR Click here to enter BP  :1}***  Physical Exam:    VS:  There were no vitals taken for this visit.    Wt Readings from Last 3 Encounters:  03/17/22 200 lb (90.7 kg)  03/09/22 202 lb 12.8 oz (92 kg)  12/29/21 206 lb (93.4 kg)     GEN: *** Well nourished, well developed in no acute distress HEENT: Normal NECK: No JVD; No carotid bruits LYMPHATICS: No lymphadenopathy CARDIAC: ***RRR, no murmurs, rubs, gallops RESPIRATORY:  Clear to auscultation without rales, wheezing or rhonchi  ABDOMEN: Soft, non-tender, non-distended MUSCULOSKELETAL:  No edema; No deformity  SKIN: Warm and dry NEUROLOGIC:  Alert and oriented x 3 PSYCHIATRIC:  Normal affect   ASSESSMENT:    No diagnosis found. PLAN:    In order of problems listed above:  #Dizziness:  #HLD: #Aortic Atherosclerosis: CT abdomen/pelvis 01/2022 with aortic atherosclerosis.  -Continue crestor '20mg'$  daily -Goal LDL<70      {Are you ordering a CV  Procedure (e.g. stress test, cath, DCCV, TEE, etc)?   Press F2        :[id number]}    Medication Adjustments/Labs and Tests Ordered: Current medicines are reviewed at length with the patient today.  Concerns regarding medicines are outlined above.  No orders of the defined types were placed in this encounter.  No orders of the defined types were placed in this encounter.   There are no Patient Instructions on file for this visit.   Signed, Freada Bergeron, MD  10/20/2022 1:22 PM    [place]

## 2022-10-21 ENCOUNTER — Encounter: Payer: Self-pay | Admitting: Cardiology

## 2022-10-21 ENCOUNTER — Ambulatory Visit: Payer: BC Managed Care – PPO | Attending: Cardiology | Admitting: Cardiology

## 2022-10-21 VITALS — BP 121/68 | HR 71 | Ht 68.0 in | Wt 201.0 lb

## 2022-10-21 DIAGNOSIS — H81399 Other peripheral vertigo, unspecified ear: Secondary | ICD-10-CM | POA: Diagnosis not present

## 2022-10-21 DIAGNOSIS — R0602 Shortness of breath: Secondary | ICD-10-CM

## 2022-10-21 DIAGNOSIS — E782 Mixed hyperlipidemia: Secondary | ICD-10-CM

## 2022-10-21 DIAGNOSIS — Z79899 Other long term (current) drug therapy: Secondary | ICD-10-CM

## 2022-10-21 DIAGNOSIS — E6609 Other obesity due to excess calories: Secondary | ICD-10-CM | POA: Diagnosis not present

## 2022-10-21 DIAGNOSIS — J302 Other seasonal allergic rhinitis: Secondary | ICD-10-CM | POA: Diagnosis not present

## 2022-10-21 DIAGNOSIS — R519 Headache, unspecified: Secondary | ICD-10-CM | POA: Diagnosis not present

## 2022-10-21 DIAGNOSIS — H8109 Meniere's disease, unspecified ear: Secondary | ICD-10-CM | POA: Diagnosis not present

## 2022-10-21 DIAGNOSIS — R42 Dizziness and giddiness: Secondary | ICD-10-CM | POA: Diagnosis not present

## 2022-10-21 DIAGNOSIS — Z6830 Body mass index (BMI) 30.0-30.9, adult: Secondary | ICD-10-CM | POA: Diagnosis not present

## 2022-10-21 DIAGNOSIS — I700 Atherosclerosis of aorta: Secondary | ICD-10-CM

## 2022-10-21 DIAGNOSIS — Z1331 Encounter for screening for depression: Secondary | ICD-10-CM | POA: Diagnosis not present

## 2022-10-21 DIAGNOSIS — Z0000 Encounter for general adult medical examination without abnormal findings: Secondary | ICD-10-CM | POA: Diagnosis not present

## 2022-10-21 DIAGNOSIS — G894 Chronic pain syndrome: Secondary | ICD-10-CM | POA: Diagnosis not present

## 2022-10-21 MED ORDER — ROSUVASTATIN CALCIUM 20 MG PO TABS: 20.0000 mg | ORAL_TABLET | Freq: Every evening | ORAL | 3 refills | Status: DC

## 2022-10-21 NOTE — Progress Notes (Signed)
Cardiology Office Note:    Date:  10/21/2022   ID:  [name], DOB 12/20/1960, MRN [id number]  PCP:  Douglas School, MD   [place] Providers Cardiologist:  None   Referring MD: Douglas School, MD     History of Present Illness:    [name] is a 61 y.o. male with a hx of GERD, aortic atherosclerosis, and HLD who was referred by Dr. Gerarda [name] for episodes of dizziness.  Today, he states that he has been doing well overall apart from his intermittent dizziness. This may occur when sitting or standing and seems to be more like "room-spinning" dizziness. He denies any lightheadedness or syncope He has fallen in the past due to feeling unsteady but this has not happened in over a year. He notes accompanying severe tinnitus in both ears. He did have a CT head earlier this month which was unremarkable.  He reports occasional brief palpitations but not particularly with his dizziness.  His LDL was 168 in 11/2021. He states that he was on a statin for the last few years but had stopped it prior to this most recent lipid panel as he had seen improvement of his cholesterol on the medication. He was advised to restart the med today.  He notes chronic shortness of breath most of his life but feels that this may be worsening. He reports a surgery on his sternum at 61 y.o. wherein he had multiple ribs removed and metal hardware placed in his chest for pectus excavatum.  He walks 4 miles or so daily at home. He states that he starts to feel warm and diaphoretic around 1 1/4 miles. However, he is able to finish his walk without any difficulty.  He denies any chest pain or peripheral edema. No headaches, orthopnea, or PND.   Past Medical History:  Diagnosis Date   Allergy    Back pain    Chewing difficulty    Difficult intubation    GERD (gastroesophageal reflux disease)    Hearing loss    Lordosis of cervicothoracic region    Muscle weakness (generalized)    Other fatigue     Pectus excavatum    Poor short term memory    Shortness of breath on exertion    Swallowing difficulty    Tinnitus     Past Surgical History:  Procedure Laterality Date   ANTERIOR CERVICAL DISCECTOMY  11/23/1997   BACK SURGERY     CERVICAL FUSION  2010, 2012   COLONOSCOPY  08/2014   Dr.Stark   COLONOSCOPY WITH PROPOFOL N/A 09/23/2021   Procedure: COLONOSCOPY WITH PROPOFOL;  Surgeon: Ladene Artist, MD;  [facility];  Service: Endoscopy;  Laterality: N/A;   INCISIONAL HERNIA REPAIR N/A 03/17/2022   Procedure: LAPAROSCOPIC VENTRAL HERNIA REPAIR;  Surgeon: [name], MD;  Location: [place];  Service: General;  Laterality: N/A;   INGUINAL HERNIA REPAIR  11/23/1984   right   INSERTION OF MESH Bilateral 03/17/2022   Procedure: INSERTION OF MESH;  Surgeon: [name], MD;  Location: [place];  Service: General;  Laterality: Bilateral;   intestinal obstruction  11/23/2008   OTHER SURGICAL HISTORY  11/23/1964   sternum surgery   POLYPECTOMY  09/23/2021   Procedure: POLYPECTOMY;  Surgeon: Ladene Artist, MD;  [facility];  Service: Endoscopy;;   ribs removed as child  11/23/1965   thornwaldt cyst removed     TONSILLECTOMY  11/23/1966   TRANSURETHRAL RESECTION OF PROSTATE  11/23/2010  ULNA OSTEOTOMY Right 10/09/2014   Procedure: ULNAR SHORTENING;  Surgeon: Daryll Brod, MD;  Location: [place];  Service: Orthopedics;  Laterality: Right;   WRIST ARTHROSCOPY Right 10/09/2014   Procedure: RIGHT WRIST ARTHROSCOPY WITH ULNA SHORTENING OSTEOTOMY;  Surgeon: Daryll Brod, MD;  Location: [place];  Service: Orthopedics;  Laterality: Right;    Current Medications: Current Meds  Medication Sig   acetaminophen (TYLENOL) 500 MG tablet Take 1,000 mg by mouth every 6 (six) hours as needed for moderate pain or headache.   Ascorbic Acid (VITAMIN C) 1000 MG tablet Take 1,000 mg by mouth daily.   B Complex-C (SUPER B COMPLEX PO) Take 1 tablet  by mouth daily.   cetirizine (ZYRTEC) 10 MG tablet Take 10 mg by mouth daily.   meclizine (ANTIVERT) 25 MG tablet Take 25 mg by mouth 4 (four) times daily.   omeprazole (PRILOSEC) 40 MG capsule Take 40 mg by mouth every evening.   oxyCODONE (OXY IR/ROXICODONE) 5 MG immediate release tablet Take 10 mg by mouth 2 (two) times daily as needed (pain).   tiZANidine (ZANAFLEX) 4 MG tablet Take 4 mg by mouth 4 (four) times daily as needed for muscle spasms.   traMADol (ULTRAM) 50 MG tablet Take 1 tablet (50 mg total) by mouth every 6 (six) hours as needed.   [DISCONTINUED] rosuvastatin (CRESTOR) 20 MG tablet Take 1 tablet (20 mg total) by mouth at bedtime.     Allergies:   Morphine   Social History   Socioeconomic History   Marital status: Married    Spouse name: [name]   Number of children: Not on file   Years of education: Not on file   Highest education level: Not on file  Occupational History   Occupation: not applicable  Tobacco Use   Smoking status: Never   Smokeless tobacco: Never  Vaping Use   Vaping Use: Never used  Substance and Sexual Activity   Alcohol use: Not Currently    Comment: rare   Drug use: No   Sexual activity: Yes    Partners: Female  Other Topics Concern   Not on file  Social History Narrative   Not on file   Social Determinants of Health   Financial Resource Strain: Not on file  Food Insecurity: Not on file  Transportation Needs: Not on file  Physical Activity: Not on file  Stress: Not on file  Social Connections: Not on file     Family History: The patient's family history includes Cancer in his father; Colon polyps in his father; Colon polyps (age of onset: 48) in his brother; Obesity in his mother and another family member. There is no history of Stomach cancer, Colon cancer, Rectal cancer, or Pancreatic cancer.  ROS:   Review of Systems  Constitutional:  Negative for chills and fever.  HENT:  Positive for tinnitus. Negative for nosebleeds.    Eyes:  Negative for blurred vision and pain.  Respiratory:  Positive for shortness of breath. Negative for cough, hemoptysis and stridor.   Cardiovascular:  Positive for palpitations. Negative for chest pain, orthopnea, claudication, leg swelling and PND.  Gastrointestinal:  Negative for blood in stool, diarrhea, nausea and vomiting.  Genitourinary:  Negative for dysuria and hematuria.  Musculoskeletal:  Negative for falls.  Neurological:  Positive for dizziness. Negative for loss of consciousness and headaches.  Psychiatric/Behavioral:  Negative for depression, hallucinations and substance abuse. The patient does not have insomnia.     EKGs/Labs/Other Studies Reviewed:  The following studies were reviewed today:  CT Chest 09/28/22: FINDINGS:  No evidence of hydrocephalus.  No intracranial mass, mass effect or midline shift.  No acute infarction evident.  No intracranial hemorrhage.  Paranasal sinuses: Small left maxillary retention cyst versus polyp..  Mastoid air cells: Clear.  Calvarium: Intact.  IMPRESSION:  No acute intracranial abnormality.   CT abdomen pelvis 01/2022: FINDINGS: Lower chest: No acute abnormality. Mild chronic pectus deformity is noted.   Hepatobiliary: No focal liver abnormality is seen. No gallstones, gallbladder wall thickening, or biliary dilatation.   Pancreas: Unremarkable. No pancreatic ductal dilatation or surrounding inflammatory changes.   Spleen: Normal in size without focal abnormality.   Adrenals/Urinary Tract: Adrenal glands are within normal limits bilaterally. Kidneys are visualized without renal calculi or obstructive changes. Mild scarring in the upper pole of the right kidney is noted stable from the prior study. Ureters are within normal limits. The bladder is well distended.   Stomach/Bowel: Minimal diverticular change of the colon is noted. No evidence of diverticulitis is seen. No obstructive or inflammatory changes are  noted. The appendix is within normal limits. No obstructive changes of the small bowel are noted. Stomach is within normal limits.   Vascular/Lymphatic: Aortic atherosclerosis. No enlarged abdominal or pelvic lymph nodes. Duplicated IVC is noted.   Reproductive: Prostate is well visualized. Changes of prior urolift procedure are noted.   Other: No free fluid is noted. Small hernia is again identified with a knuckle of small bowel within similar to that noted on prior exam. Postsurgical changes are noted in the left anterior abdominal wall which correspond to the palpable abnormality seen just below the previously described hernia. These changes are new from the prior exam. Right inguinal hernia is noted containing fat. No bowel is seen within. This has increased in the interval from the prior exam. Slightly more prominent fat plane is noted within the left inguinal canal consistent with a small recurrent hernia as well.   Musculoskeletal: Postsurgical changes in the lower lumbar spine are seen. No acute abnormality is noted.   IMPRESSION: Palpable abnormalities correspond to a stable anterior wall abdominal hernia adjacent to the umbilicus stable from the prior exam. The second left-sided palpable abnormality corresponds to scarring from previous surgery new from the prior exam. The third palpable abnormality in the right lower quadrant corresponds to a fat containing right inguinal hernia. A small recurrent fat containing left inguinal hernia is noted as well.   Diverticulosis without diverticulitis.   Chronic changes similar to that seen on the prior exam.  EKG:  EKG is ordered today, 10/21/22.  The ekg ordered today demonstrates NSR with a rate of 71bpm.  Recent Labs: 11/27/2021: ALT 25 03/09/2022: BUN 14; Creatinine, Ser 1.15; Hemoglobin 15.3; Platelets 202; Potassium 4.1; Sodium 139  Recent Lipid Panel    Component Value Date/Time   CHOL 227 (H) 11/27/2021 0931   TRIG  83 11/27/2021 0931   HDL 44 11/27/2021 0931   LDLCALC 168 (H) 11/27/2021 0931     Risk Assessment/Calculations:                Physical Exam:    VS:  BP 121/68   Pulse 71   Ht '5\' 8"'$  (1.727 m)   Wt 201 lb (91.2 kg)   SpO2 97%   BMI 30.56 kg/m     Wt Readings from Last 3 Encounters:  10/21/22 201 lb (91.2 kg)  03/17/22 200 lb (90.7 kg)  03/09/22 202 lb 12.8 oz (  92 kg)     GEN: Well nourished, well developed in no acute distress HEENT: Normal NECK: No JVD; No carotid bruits CARDIAC: RRR, no murmurs, rubs, gallops RESPIRATORY:  Clear to auscultation without rales, wheezing or rhonchi  ABDOMEN: Soft, non-tender, non-distended MUSCULOSKELETAL:  No edema; No deformity  SKIN: Warm and dry NEUROLOGIC:  Alert and oriented x 3 PSYCHIATRIC:  Normal affect   ASSESSMENT:    1. SOB (shortness of breath)   2. Mixed hyperlipidemia   3. Medication management   4. Dizziness   5. Aortic atherosclerosis (HCC)    PLAN:    In order of problems listed above:  #Dizziness: Symptoms most consistent with vertigo as he feels as if the "room is spinning" without lightheadedness. No syncope. Denies chest pain, palpitations or SOB at the time. CT head unremarkable. Will continue work-up with PCP.  #DOE: Chronic in the setting of pectus excavatum requiring extensive surgery as a child. Reports is it slightly worse than baseline but he is able to walk 49mles without significant issues. Will check TTE for further evaluation. -Check TTE  #HLD: #Aortic Atherosclerosis: CT abdomen/pelvis 01/2022 with aortic atherosclerosis but states he is not currently on his statin. Discussed how this will be a life-long medication for him given the presence of aortic plaque. Patient also amenable to checking Ca score today. -Resume crestor '20mg'$  daily -Check Ca score -Repeat lipids 8 weeks after resuming crestor -Goal LDL<70           Medication Adjustments/Labs and Tests Ordered: Current  medicines are reviewed at length with the patient today.  Concerns regarding medicines are outlined above.  Orders Placed This Encounter  Procedures   CT CARDIAC SCORING (SELF PAY ONLY)   Lipid Profile   EKG 12-Lead   ECHOCARDIOGRAM COMPLETE   Meds ordered this encounter  Medications   rosuvastatin (CRESTOR) 20 MG tablet    Sig: Take 1 tablet (20 mg total) by mouth at bedtime.    Dispense:  90 tablet    Refill:  3    Patient Instructions  Medication Instructions:   PLEASE START BACK TAKING YOUR CRESTOR EVERYDAY   *If you need a refill on your cardiac medications before your next appointment, please call your pharmacy*   Lab Work:  [facility]--CHECK LIPIDS--PLEASE COME FASTING   If you have labs (blood work) drawn today and your tests are completely normal, you will receive your results only by: MSandia(if you have MyChart) OR A paper copy in the mail If you have any lab test that is abnormal or we need to change your treatment, we will call you to review the results.   Testing/Procedures:  Your physician has requested that you have an echocardiogram. Echocardiography is a painless test that uses sound waves to create images of your heart. It provides your doctor with information about the size and shape of your heart and how well your heart's chambers and valves are working. This procedure takes approximately one hour. There are no restrictions for this procedure. Please do NOT wear cologne, perfume, aftershave, or lotions (deodorant is allowed). Please arrive 15 minutes prior to your appointment time.   CARDIAC CALCIUM SCORE (SELF PAY)   Follow-Up: [facility] and your health needs are our priority.  As part of our continuing mission to provide you with exceptional heart care, we have created designated Provider Care Teams.  These Care Teams include your primary Cardiologist (physician) and Advanced Practice  Providers (APPs  -  Physician Assistants and Nurse Practitioners) who all work together to provide you with the care you need, when you need it.  We recommend signing up for the patient portal called "MyChart".  Sign up information is provided on this After Visit Summary.  MyChart is used to connect with patients for Virtual Visits (Telemedicine).  Patients are able to view lab/test results, encounter notes, upcoming appointments, etc.  Non-urgent messages can be sent to your provider as well.   To learn more about what you can do with MyChart, go to [email or web address].    Your next appointment:   1 year(s)  The format for your next appointment:   In Person  Provider:   DR. Johney Frame  Important Information About Sugar          I,Alexis Herring,acting as a scribe for Freada Bergeron, MD.,have documented all relevant documentation on the behalf of Freada Bergeron, MD,as directed by  Freada Bergeron, MD while in the presence of Freada Bergeron, MD.  I, Freada Bergeron, MD, have reviewed all documentation for this visit. The documentation on 10/21/22 for the exam, diagnosis, procedures, and orders are all accurate and complete.   Signed, Freada Bergeron, MD  10/21/2022 4:35 PM    [employer]

## 2022-10-21 NOTE — Patient Instructions (Signed)
Medication Instructions:   PLEASE START BACK TAKING YOUR CRESTOR EVERYDAY   *If you need a refill on your cardiac medications before your next appointment, please call your pharmacy*   Lab Work:  [facility] [address] [facility] OFFICE--CHECK LIPIDS--PLEASE COME FASTING   If you have labs (blood work) drawn today and your tests are completely normal, you will receive your results only by: [place] (if you have MyChart) OR A paper copy in the mail If you have any lab test that is abnormal or we need to change your treatment, we will call you to review the results.   Testing/Procedures:  Your physician has requested that you have an echocardiogram. Echocardiography is a painless test that uses sound waves to create images of your heart. It provides your doctor with information about the size and shape of your heart and how well your heart's chambers and valves are working. This procedure takes approximately one hour. There are no restrictions for this procedure. Please do NOT wear cologne, perfume, aftershave, or lotions (deodorant is allowed). Please arrive 15 minutes prior to your appointment time.   CARDIAC CALCIUM SCORE (SELF PAY)   Follow-Up: At [facility], you and your health needs are our priority.  As part of our continuing mission to provide you with exceptional heart care, we have created designated Provider Care Teams.  These Care Teams include your primary Cardiologist (physician) and Advanced Practice Providers (APPs -  Physician Assistants and Nurse Practitioners) who all work together to provide you with the care you need, when you need it.  We recommend signing up for the patient portal called "MyChart".  Sign up information is provided on this After Visit Summary.  MyChart is used to connect with patients for Virtual Visits (Telemedicine).  Patients are able to view lab/test results, encounter notes, upcoming appointments, etc.  Non-urgent messages can be sent to  your provider as well.   To learn more about what you can do with MyChart, go to [email or web address].    Your next appointment:   1 year(s)  The format for your next appointment:   In Person  Provider:   DR. Johney Frame  Important Information About [place]

## 2022-11-04 ENCOUNTER — Ambulatory Visit (HOSPITAL_COMMUNITY)
Admission: RE | Admit: 2022-11-04 | Discharge: 2022-11-04 | Disposition: A | Discharge status: Home / Self Care | Payer: BC Managed Care – PPO | Source: Ambulatory Visit | Attending: Cardiology | Admitting: Cardiology

## 2022-11-04 ENCOUNTER — Telehealth: Payer: Self-pay | Admitting: *Deleted

## 2022-11-04 DIAGNOSIS — R0602 Shortness of breath: Secondary | ICD-10-CM

## 2022-11-04 DIAGNOSIS — Z79899 Other long term (current) drug therapy: Secondary | ICD-10-CM | POA: Insufficient documentation

## 2022-11-04 DIAGNOSIS — E782 Mixed hyperlipidemia: Secondary | ICD-10-CM | POA: Insufficient documentation

## 2022-11-04 NOTE — Telephone Encounter (Signed)
-----   Message from Sueanne Margarita, MD sent at 11/04/2022  5:18 PM EST ----- If he has not see pulmonary please refer for SOB ----- Message ----- From: Interface, Rad Results In Sent: 11/04/2022   2:13 PM EST To: Freada Bergeron, MD

## 2022-11-04 NOTE — Telephone Encounter (Signed)
Sueanne Margarita, MD  Nuala Alpha, LPN If he has not see pulmonary please refer for SOB  Pt aware we will refer him to Pulmonology for sob, if he is not already followed by them.  Pt states it has been quite sometime since he's seen a Pulmonologist and would like for [place] to refer him to one within the [facility].   Pt aware I will place the referral in the system and send a message to our [facility] Scheduling team to coordinate with Pulmonology in getting him a new pt appt.  Pt verbalized understanding and agrees with this plan.

## 2022-11-09 ENCOUNTER — Ambulatory Visit: Payer: BC Managed Care – PPO | Admitting: Psychiatry

## 2022-11-10 ENCOUNTER — Telehealth: Payer: Self-pay | Admitting: *Deleted

## 2022-11-10 NOTE — Telephone Encounter (Signed)
-----   Message from Armando Gang sent at 11/06/2022 10:12 AM EST ----- Regarding: RE: refer to Pulmonolgy per Dr. Radford Pax and Dr. Johney Frame Patient is schedule for 12-18-22 with Dr. Elsworth Soho in [place]/Spoke with patient to give him the appointment and he stated he didn't want to see the MD and wanted me to cancel the appointment.   I don't know if you need to discuss this with him before I cancel the appointment.     ----- Message ----- From: Nuala Alpha, LPN Sent: [id number]/07/7352   5:45 PM EST To: Armando Gang; [facility] Subject: refer to Pulmonolgy per Dr. Radford Pax and Dr. P#  Dr. Radford Pax and Dr. Johney Frame want to refer this pt to Pulmonology for ongoing sob and based on recent CT results.  Pt does have pectus excavatum  (sunken in chest).  Referral is in the system.  Pt is aware of referral and aware you will call to help coordinate this appt.  Can you please call him and help in getting him a new pt appt with Pulmonology and shoot me the date thereafter?  Thanks for all you do, Karlene Einstein

## 2022-11-12 ENCOUNTER — Ambulatory Visit (HOSPITAL_COMMUNITY): Payer: BC Managed Care – PPO | Attending: Cardiovascular Disease

## 2022-11-12 DIAGNOSIS — R0602 Shortness of breath: Secondary | ICD-10-CM | POA: Diagnosis not present

## 2022-11-12 LAB — ECHOCARDIOGRAM COMPLETE
Area-P 1/2: 3.65 cm2
S' Lateral: 3.2 cm

## 2022-11-25 DIAGNOSIS — J101 Influenza due to other identified influenza virus with other respiratory manifestations: Secondary | ICD-10-CM | POA: Diagnosis not present

## 2022-11-25 DIAGNOSIS — E669 Obesity, unspecified: Secondary | ICD-10-CM | POA: Diagnosis not present

## 2022-11-25 DIAGNOSIS — Z6830 Body mass index (BMI) 30.0-30.9, adult: Secondary | ICD-10-CM | POA: Diagnosis not present

## 2022-11-25 DIAGNOSIS — R030 Elevated blood-pressure reading, without diagnosis of hypertension: Secondary | ICD-10-CM | POA: Diagnosis not present

## 2022-12-16 ENCOUNTER — Ambulatory Visit: Payer: BC Managed Care – PPO | Attending: Cardiology

## 2022-12-16 DIAGNOSIS — E782 Mixed hyperlipidemia: Secondary | ICD-10-CM | POA: Diagnosis not present

## 2022-12-16 DIAGNOSIS — Z79899 Other long term (current) drug therapy: Secondary | ICD-10-CM | POA: Diagnosis not present

## 2022-12-16 DIAGNOSIS — R0602 Shortness of breath: Secondary | ICD-10-CM

## 2022-12-17 LAB — LIPID PANEL
Chol/HDL Ratio: 5.6 ratio — ABNORMAL HIGH (ref 0.0–5.0)
Cholesterol, Total: 214 mg/dL — ABNORMAL HIGH (ref 100–199)
HDL: 38 mg/dL — ABNORMAL LOW (ref >39)
LDL Chol Calc (NIH): 156 mg/dL — ABNORMAL HIGH (ref 0–99)
Triglycerides: 111 mg/dL (ref 0–149)
VLDL Cholesterol Cal: 20 mg/dL (ref 5–40)

## 2022-12-18 ENCOUNTER — Institutional Professional Consult (permissible substitution): Payer: BC Managed Care – PPO | Admitting: Pulmonary Disease

## 2022-12-28 ENCOUNTER — Telehealth: Payer: Self-pay | Admitting: *Deleted

## 2022-12-28 DIAGNOSIS — E782 Mixed hyperlipidemia: Secondary | ICD-10-CM

## 2022-12-28 DIAGNOSIS — Z79899 Other long term (current) drug therapy: Secondary | ICD-10-CM

## 2022-12-28 DIAGNOSIS — I700 Atherosclerosis of aorta: Secondary | ICD-10-CM

## 2022-12-28 MED ORDER — EZETIMIBE 10 MG PO TABS: 10.0000 mg | ORAL_TABLET | Freq: Every day | ORAL | 3 refills | Status: DC

## 2022-12-28 NOTE — Telephone Encounter (Signed)
-----   Message from Freada Bergeron, MD sent at 12/28/2022  8:14 AM EST ----- Let's start him on zetia '10mg'$  daily and refer to lipid clinic.  ----- Message ----- From: Nuala Alpha, LPN Sent: [id number]/82/5003   4:21 PM EST To: Nuala Alpha, LPN; Freada Bergeron, MD  Review and advise--he is taking his crestor everyday but is eating terribly  ----- Message ----- From: Freada Bergeron, MD Sent: 12/17/2022  12:20 PM EST To: Nuala Alpha, LPN  His LDL cholesterol remains elevated. Is he taking his statin okay?

## 2022-12-28 NOTE — Telephone Encounter (Signed)
The patient has been notified of the result and verbalized understanding.  All questions (if any) were answered.  Pt aware that Dr. Johney Frame wants him to continue his current dose of crestor and we will add zetia 10 mg po daily to his regimen, and refer him to our lipid clinic for further management.  Confirmed the pharmacy of choice with the pt.   Pt aware that I will place the referral to lipid clinic in the system and send a message to our [facility] Scheduling team to call him back and arrange this appt.  Pt verbalized understanding and agrees with this plan.

## 2023-01-01 DIAGNOSIS — J069 Acute upper respiratory infection, unspecified: Secondary | ICD-10-CM | POA: Diagnosis not present

## 2023-01-01 DIAGNOSIS — L309 Dermatitis, unspecified: Secondary | ICD-10-CM | POA: Diagnosis not present

## 2023-01-01 DIAGNOSIS — L988 Other specified disorders of the skin and subcutaneous tissue: Secondary | ICD-10-CM | POA: Diagnosis not present

## 2023-01-01 DIAGNOSIS — R030 Elevated blood-pressure reading, without diagnosis of hypertension: Secondary | ICD-10-CM | POA: Diagnosis not present

## 2023-01-13 ENCOUNTER — Telehealth: Payer: Self-pay | Admitting: *Deleted

## 2023-01-13 NOTE — Telephone Encounter (Signed)
-----   Message from [facility] sent at 01/13/2023  9:32 AM EST ----- Regarding: RE: refer to lipid clinic per Dr. Abelina Bachelor,  Patient was contacted 3x with no success in reaching him to schedule - just FYI ----- Message ----- From: Nuala Alpha, LPN Sent: [id number]   5:29 PM EST To: Imagene Gurney; [facility] Subject: FW: refer to lipid clinic per Dr. Johney Frame     ----- Message ----- From: Nuala Alpha, LPN Sent: [id number]   9:45 AM EST To: [facility] Subject: refer to lipid clinic per Dr. Johney Frame        Dr. Johney Frame referred this pt to lipid clinic to be seen for uncontrolled cholesterol and for further management of lipids  Referral is in the system  pt aware you will call to schedule   Can you please schedule and shoot me the date thereafter?  Thanks [employer]

## 2023-01-20 ENCOUNTER — Ambulatory Visit
Admission: RE | Admit: 2023-01-20 | Discharge: 2023-01-20 | Disposition: A | Discharge status: Home / Self Care | Payer: BC Managed Care – PPO | Source: Ambulatory Visit | Attending: Emergency Medicine | Admitting: Emergency Medicine

## 2023-01-20 VITALS — BP 135/79 | HR 69 | Temp 98.8°F | Resp 20

## 2023-01-20 DIAGNOSIS — H9191 Unspecified hearing loss, right ear: Secondary | ICD-10-CM | POA: Diagnosis not present

## 2023-01-20 NOTE — ED Triage Notes (Signed)
Pt reports his right ear is ringing, has a bike horn like sound in his right ear whenever sounded sound occurs x 2 days. (A clap) Pt says he only can hear a little bit out of his ear about 10%.

## 2023-01-20 NOTE — ED Provider Notes (Signed)
HPI  SUBJECTIVE:  [name] is a 62 y.o. male who presents with decreased hearing in his right ear starting 2 days ago.  States he woke up with it.  It has not changed since it started.  No pain, fevers, headaches or nasal congestion, otorrhea, vertigo, recent exposure to loud noise, mechanical trauma, recent travel.  He reports hearing tinnitus described as a "bicycle bell ringing" when exposed to loud noise.  The hearing does not change if he puts in his hearing aid.  He tried self insufflation without improvement in his symptoms.  He also takes Zyrtec on a daily basis.  No aggravating or alleviating factors.  He has a past medical history of hypercholesterolemia, hearing loss bilaterally, Alzheimer's.  No history of CVA, atrial fibrillation.  ENT: None.  He has an Nurse, children's.    Past Medical History:  Diagnosis Date   Allergy    Back pain    Chewing difficulty    Difficult intubation    GERD (gastroesophageal reflux disease)    Hearing loss    Lordosis of cervicothoracic region    Muscle weakness (generalized)    Other fatigue    Pectus excavatum    Poor short term memory    Shortness of breath on exertion    Swallowing difficulty    Tinnitus     Past Surgical History:  Procedure Laterality Date   ANTERIOR CERVICAL DISCECTOMY  11/23/1997   BACK SURGERY     CERVICAL FUSION  2010, 2012   COLONOSCOPY  08/2014   Dr.Stark   COLONOSCOPY WITH PROPOFOL N/A 09/23/2021   Procedure: COLONOSCOPY WITH PROPOFOL;  Surgeon: Ladene Artist, MD;  Location: [facility];  Service: Endoscopy;  Laterality: N/A;   INCISIONAL HERNIA REPAIR N/A 03/17/2022   Procedure: LAPAROSCOPIC VENTRAL HERNIA REPAIR;  Surgeon: [name], MD;  Location: [place];  Service: General;  Laterality: N/A;   INGUINAL HERNIA REPAIR  11/23/1984   right   INSERTION OF MESH Bilateral 03/17/2022   Procedure: INSERTION OF MESH;  Surgeon: [name], MD;  Location: [name];  Service: General;  Laterality: Bilateral;    intestinal obstruction  11/23/2008   OTHER SURGICAL HISTORY  11/23/1964   sternum surgery   POLYPECTOMY  09/23/2021   Procedure: POLYPECTOMY;  Surgeon: Ladene Artist, MD;  Location: [facility];  Service: Endoscopy;;   ribs removed as child  11/23/1965   thornwaldt cyst removed     TONSILLECTOMY  11/23/1966   TRANSURETHRAL RESECTION OF PROSTATE  11/23/2010   ULNA OSTEOTOMY Right 10/09/2014   Procedure: Aleatha Borer SHORTENING;  Surgeon: Daryll Brod, MD;  Location: [place];  Service: Orthopedics;  Laterality: Right;   WRIST ARTHROSCOPY Right 10/09/2014   Procedure: RIGHT WRIST ARTHROSCOPY WITH ULNA SHORTENING OSTEOTOMY;  Surgeon: Daryll Brod, MD;  Location: [place];  Service: Orthopedics;  Laterality: Right;    Family History  Problem Relation Age of Onset   Obesity Mother    Cancer Father    Colon polyps Father    Colon polyps Brother 45   Obesity Other    Stomach cancer Neg Hx    Colon cancer Neg Hx    Rectal cancer Neg Hx    Pancreatic cancer Neg Hx     Social History   Tobacco Use   Smoking status: Never   Smokeless tobacco: Never  Vaping Use   Vaping Use: Never used  Substance Use Topics   Alcohol use: Not Currently    Comment: rare  Drug use: No    No current facility-administered medications for this encounter.  Current Outpatient Medications:    acetaminophen (TYLENOL) 500 MG tablet, Take 1,000 mg by mouth every 6 (six) hours as needed for moderate pain or headache., Disp: , Rfl:    Ascorbic Acid (VITAMIN C) 1000 MG tablet, Take 1,000 mg by mouth daily., Disp: , Rfl:    B Complex-C (SUPER B COMPLEX PO), Take 1 tablet by mouth daily., Disp: , Rfl:    cetirizine (ZYRTEC) 10 MG tablet, Take 10 mg by mouth daily., Disp: , Rfl:    ezetimibe (ZETIA) 10 MG tablet, Take 1 tablet (10 mg total) by mouth daily., Disp: 90 tablet, Rfl: 3   meclizine (ANTIVERT) 25 MG tablet, Take 25 mg by mouth 4 (four) times daily., Disp: , Rfl:     omeprazole (PRILOSEC) 40 MG capsule, Take 40 mg by mouth every evening., Disp: , Rfl:    oxyCODONE (OXY IR/ROXICODONE) 5 MG immediate release tablet, Take 10 mg by mouth 2 (two) times daily as needed (pain)., Disp: , Rfl:    rosuvastatin (CRESTOR) 20 MG tablet, Take 1 tablet (20 mg total) by mouth at bedtime., Disp: 90 tablet, Rfl: 3   tiZANidine (ZANAFLEX) 4 MG tablet, Take 4 mg by mouth 4 (four) times daily as needed for muscle spasms., Disp: , Rfl:    traMADol (ULTRAM) 50 MG tablet, Take 1 tablet (50 mg total) by mouth every 6 (six) hours as needed., Disp: 20 tablet, Rfl: 0  Allergies  Allergen Reactions   Morphine Itching     ROS  As noted in HPI.   Physical Exam  BP 135/79 (BP Location: Right Arm)   Pulse 69   Temp 98.8 F (37.1 C) (Oral)   Resp 20   SpO2 97%   Constitutional: Well developed, well nourished, no acute distress Eyes:  EOMI, conjunctiva normal bilaterally HENT: Normocephalic, atraumatic,mucus membranes moist.  Right external ear, EAC, TM normal.  TM intact.  Decreased hearing right ear compared to left, but intact.  No pain with traction on pinna, palpation of tragus or mastoid.  Left TM normal. Neck: No cervical lymphadenopathy Respiratory: Normal inspiratory effort Cardiovascular: Normal rate GI: nondistended skin: No rash, skin intact Musculoskeletal: no deformities Neurologic: Alert & oriented x 3, nerves III through XII grossly intact, finger-nose, heel-shin, tandem gait steady.  Romberg negative.  Moving all extremities equally.  Speech fluent.  No facial droop. Psychiatric: Speech and behavior appropriate   ED Course   Medications - No data to display  No orders of the defined types were placed in this encounter.   No results found for this or any previous visit (from the past 24 hour(s)). No results found.  ED Clinical Impression  1. Decreased hearing of right ear      ED Assessment/Plan      No evidence of mechanical obstruction,  otitis media, otitis externa, TM perforation or trauma.  Patient denies exposure to loud noise or barotrauma.  He is completely neurologically intact.  Doubt stroke.  [place] not available in this facility to differentiate between sensorineural versus conductive hearing loss.  Advised him to follow-up with his audiologist or with Dr. Benjamine Mola, ENT ASAP.  ER return precautions given.   Discussed MDM, treatment plan, and plan for follow-up with patient. Discussed sn/sx that should prompt return to the ED. patient agrees with plan.   No orders of the defined types were placed in this encounter.     *This clinic  note was created using [employer]. Therefore, there may be occasional mistakes despite careful proofreading.  ?    Melynda Ripple, MD 01/20/23 1301

## 2023-01-20 NOTE — Discharge Instructions (Signed)
I am unable to find a cause of your hearing loss today.  Please follow-up with your audiologist or with Dr. Benjamine Mola ASAP.

## 2023-01-28 DIAGNOSIS — D225 Melanocytic nevi of trunk: Secondary | ICD-10-CM | POA: Diagnosis not present

## 2023-01-28 DIAGNOSIS — L820 Inflamed seborrheic keratosis: Secondary | ICD-10-CM | POA: Diagnosis not present

## 2023-01-28 DIAGNOSIS — L02229 Furuncle of trunk, unspecified: Secondary | ICD-10-CM | POA: Diagnosis not present

## 2023-01-28 DIAGNOSIS — B9689 Other specified bacterial agents as the cause of diseases classified elsewhere: Secondary | ICD-10-CM | POA: Diagnosis not present

## 2023-06-28 DIAGNOSIS — G894 Chronic pain syndrome: Secondary | ICD-10-CM | POA: Diagnosis not present

## 2023-06-28 DIAGNOSIS — H8109 Meniere's disease, unspecified ear: Secondary | ICD-10-CM | POA: Diagnosis not present

## 2023-06-28 DIAGNOSIS — M5412 Radiculopathy, cervical region: Secondary | ICD-10-CM | POA: Diagnosis not present

## 2023-06-28 DIAGNOSIS — R519 Headache, unspecified: Secondary | ICD-10-CM | POA: Diagnosis not present

## 2023-07-19 ENCOUNTER — Other Ambulatory Visit (HOSPITAL_COMMUNITY): Payer: Self-pay | Admitting: Internal Medicine

## 2023-07-19 DIAGNOSIS — M5412 Radiculopathy, cervical region: Secondary | ICD-10-CM

## 2023-08-12 ENCOUNTER — Other Ambulatory Visit: Payer: Self-pay | Admitting: Physical Medicine & Rehabilitation

## 2023-08-12 ENCOUNTER — Ambulatory Visit
Admission: RE | Admit: 2023-08-12 | Discharge: 2023-08-12 | Disposition: A | Discharge status: Home / Self Care | Payer: Medicare HMO | Source: Ambulatory Visit | Attending: Physical Medicine & Rehabilitation | Admitting: Physical Medicine & Rehabilitation

## 2023-08-12 DIAGNOSIS — M5412 Radiculopathy, cervical region: Secondary | ICD-10-CM

## 2023-08-12 DIAGNOSIS — Z981 Arthrodesis status: Secondary | ICD-10-CM | POA: Diagnosis not present

## 2023-08-12 DIAGNOSIS — M549 Dorsalgia, unspecified: Secondary | ICD-10-CM | POA: Diagnosis not present

## 2023-08-12 DIAGNOSIS — M533 Sacrococcygeal disorders, not elsewhere classified: Secondary | ICD-10-CM | POA: Diagnosis not present

## 2023-08-12 DIAGNOSIS — M961 Postlaminectomy syndrome, not elsewhere classified: Secondary | ICD-10-CM | POA: Diagnosis not present

## 2023-08-12 DIAGNOSIS — M5451 Vertebrogenic low back pain: Secondary | ICD-10-CM | POA: Diagnosis not present

## 2023-08-12 DIAGNOSIS — G8929 Other chronic pain: Secondary | ICD-10-CM | POA: Diagnosis not present

## 2023-08-12 DIAGNOSIS — M542 Cervicalgia: Secondary | ICD-10-CM | POA: Diagnosis not present

## 2023-08-23 DIAGNOSIS — M533 Sacrococcygeal disorders, not elsewhere classified: Secondary | ICD-10-CM | POA: Diagnosis not present

## 2023-08-24 DIAGNOSIS — M5412 Radiculopathy, cervical region: Secondary | ICD-10-CM | POA: Diagnosis not present

## 2023-08-24 DIAGNOSIS — M47812 Spondylosis without myelopathy or radiculopathy, cervical region: Secondary | ICD-10-CM | POA: Diagnosis not present

## 2023-08-24 DIAGNOSIS — Z6830 Body mass index (BMI) 30.0-30.9, adult: Secondary | ICD-10-CM | POA: Diagnosis not present

## 2023-08-25 DIAGNOSIS — M47813 Spondylosis without myelopathy or radiculopathy, cervicothoracic region: Secondary | ICD-10-CM | POA: Diagnosis not present

## 2023-08-25 DIAGNOSIS — M5023 Other cervical disc displacement, cervicothoracic region: Secondary | ICD-10-CM | POA: Diagnosis not present

## 2023-09-06 DIAGNOSIS — M5412 Radiculopathy, cervical region: Secondary | ICD-10-CM | POA: Diagnosis not present

## 2023-09-06 DIAGNOSIS — M961 Postlaminectomy syndrome, not elsewhere classified: Secondary | ICD-10-CM | POA: Diagnosis not present

## 2023-09-06 DIAGNOSIS — M47817 Spondylosis without myelopathy or radiculopathy, lumbosacral region: Secondary | ICD-10-CM | POA: Diagnosis not present

## 2023-09-06 DIAGNOSIS — G8929 Other chronic pain: Secondary | ICD-10-CM | POA: Diagnosis not present

## 2023-09-06 DIAGNOSIS — M5451 Vertebrogenic low back pain: Secondary | ICD-10-CM | POA: Diagnosis not present

## 2023-09-14 DIAGNOSIS — G5603 Carpal tunnel syndrome, bilateral upper limbs: Secondary | ICD-10-CM | POA: Diagnosis not present

## 2023-09-24 DIAGNOSIS — Z981 Arthrodesis status: Secondary | ICD-10-CM | POA: Diagnosis not present

## 2023-09-24 DIAGNOSIS — M5413 Radiculopathy, cervicothoracic region: Secondary | ICD-10-CM | POA: Diagnosis not present

## 2023-10-06 DIAGNOSIS — G5603 Carpal tunnel syndrome, bilateral upper limbs: Secondary | ICD-10-CM | POA: Diagnosis not present

## 2023-10-06 DIAGNOSIS — M79631 Pain in right forearm: Secondary | ICD-10-CM | POA: Diagnosis not present

## 2023-10-06 DIAGNOSIS — G5631 Lesion of radial nerve, right upper limb: Secondary | ICD-10-CM | POA: Diagnosis not present

## 2023-10-28 DIAGNOSIS — Z981 Arthrodesis status: Secondary | ICD-10-CM | POA: Diagnosis not present

## 2023-10-28 DIAGNOSIS — Z6838 Body mass index (BMI) 38.0-38.9, adult: Secondary | ICD-10-CM | POA: Diagnosis not present

## 2023-10-28 DIAGNOSIS — G894 Chronic pain syndrome: Secondary | ICD-10-CM | POA: Diagnosis not present

## 2023-11-29 DIAGNOSIS — J302 Other seasonal allergic rhinitis: Secondary | ICD-10-CM | POA: Diagnosis not present

## 2023-11-29 DIAGNOSIS — Z20822 Contact with and (suspected) exposure to covid-19: Secondary | ICD-10-CM | POA: Diagnosis not present

## 2023-11-29 DIAGNOSIS — Z7689 Persons encountering health services in other specified circumstances: Secondary | ICD-10-CM | POA: Diagnosis not present

## 2023-12-07 DIAGNOSIS — M79672 Pain in left foot: Secondary | ICD-10-CM | POA: Diagnosis not present

## 2023-12-07 DIAGNOSIS — M19072 Primary osteoarthritis, left ankle and foot: Secondary | ICD-10-CM | POA: Diagnosis not present

## 2023-12-07 DIAGNOSIS — B351 Tinea unguium: Secondary | ICD-10-CM | POA: Diagnosis not present

## 2023-12-07 DIAGNOSIS — J0100 Acute maxillary sinusitis, unspecified: Secondary | ICD-10-CM | POA: Diagnosis not present

## 2023-12-10 IMAGING — CT CT ABD-PELV W/O CM
1 of 2 series · 13 of 32 positions shown, 18 images · non-contrast
Comparison: 09/24/2017

CLINICAL DATA: History of prior left inguinal hernia repair with
palpable abnormalities in the region of the umbilicus and right
lower quadrant, evaluate for recurrent hernia



[Series 2: abd/pelvis w/(date) · axial · 0.92mm/px · z∈[-564,-84]mm · 13 of 108 slices shown, 18 images]
[im 6/108  soft-tissue]
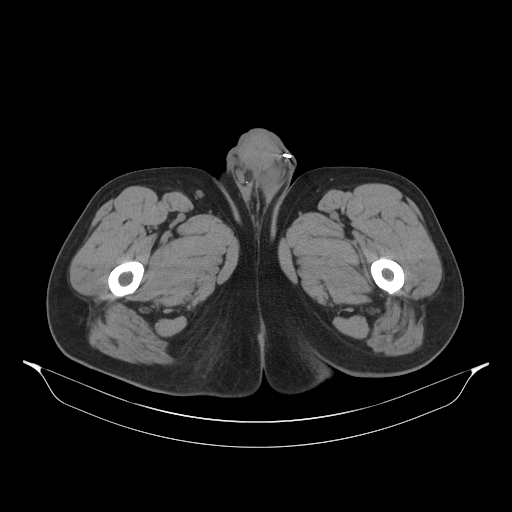
[im 6/108  bone]
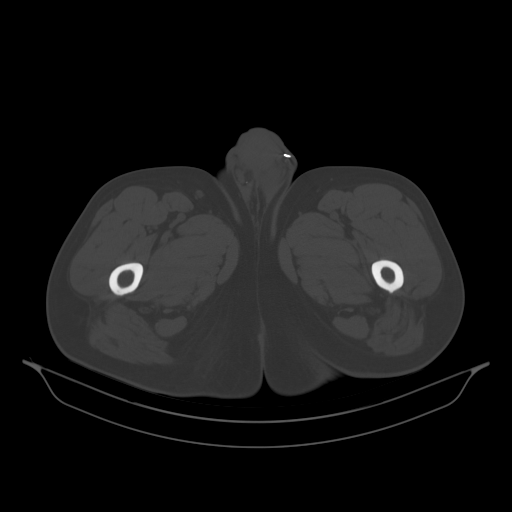
[im 17/108  soft-tissue]
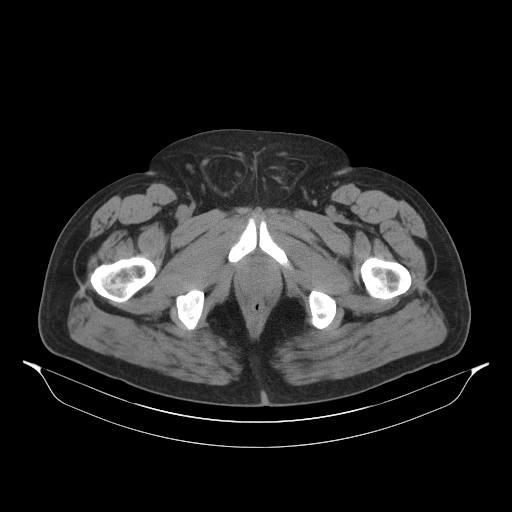
[im 23/108  soft-tissue]
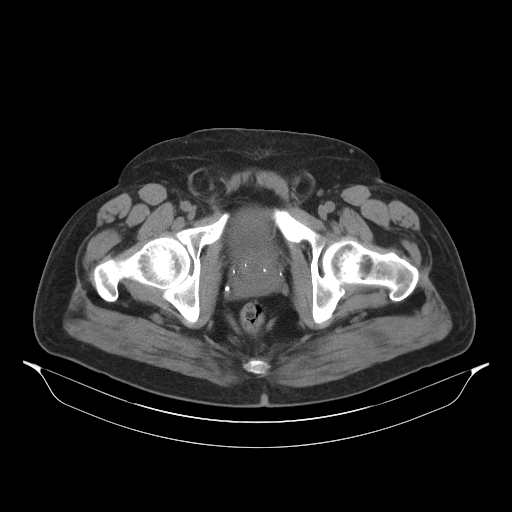
[im 34/108  soft-tissue]
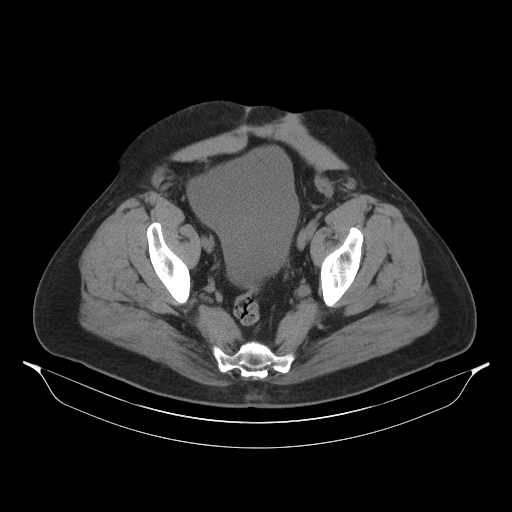
[im 40/108  soft-tissue]
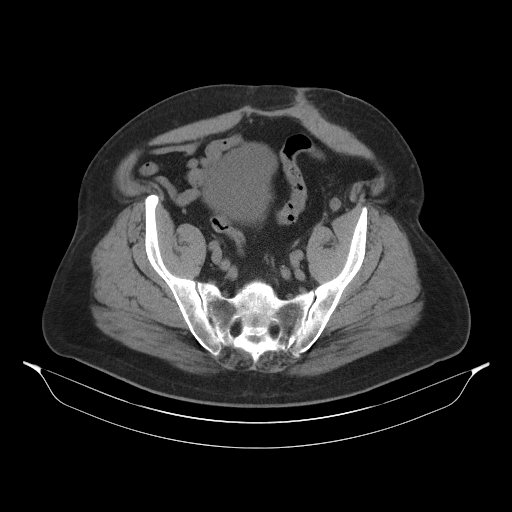
[im 51/108  soft-tissue]
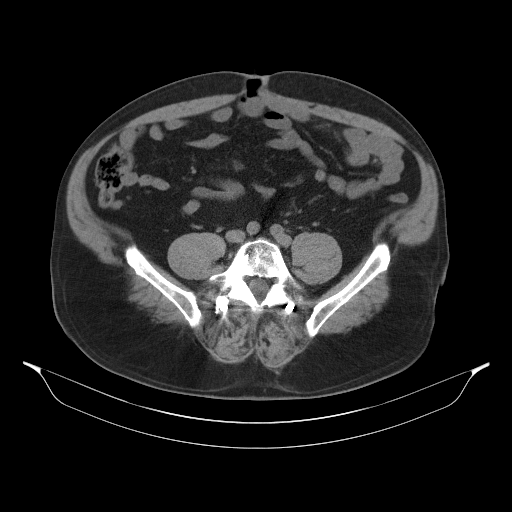
[im 57/108  soft-tissue]
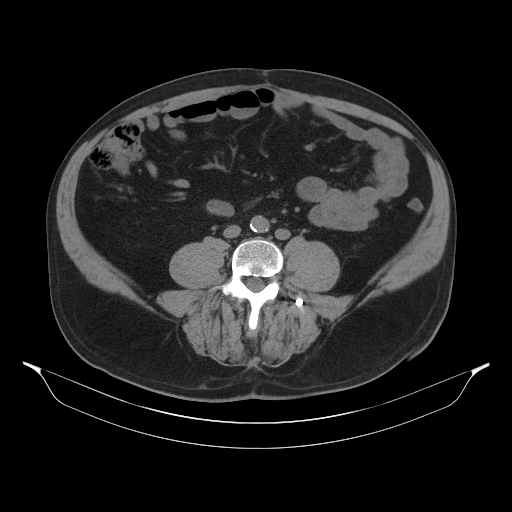
[im 68/108  soft-tissue]
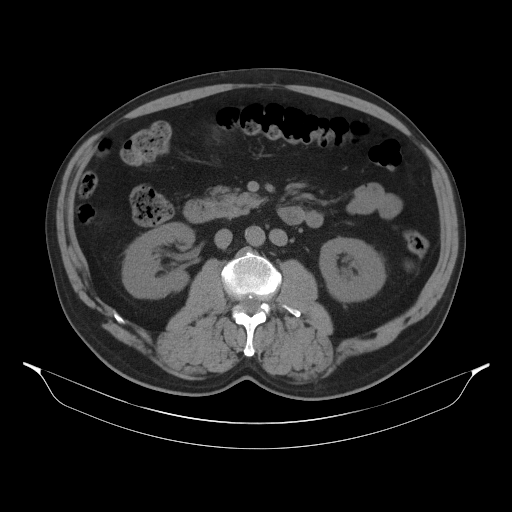
[im 74/108  soft-tissue]
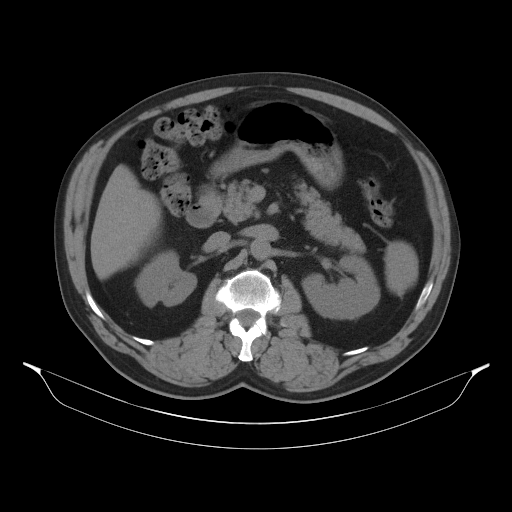
[im 74/108  bone]
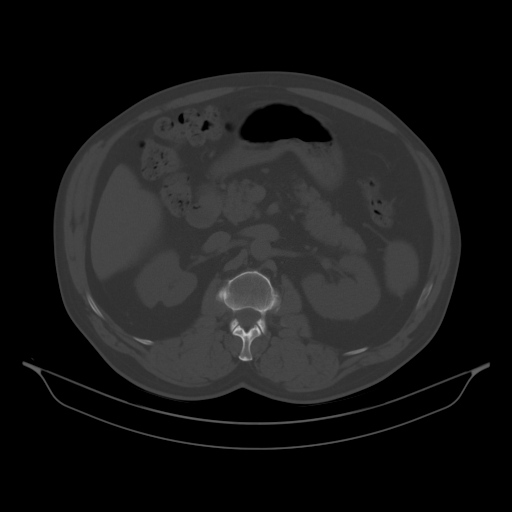
[im 85/108  soft-tissue]
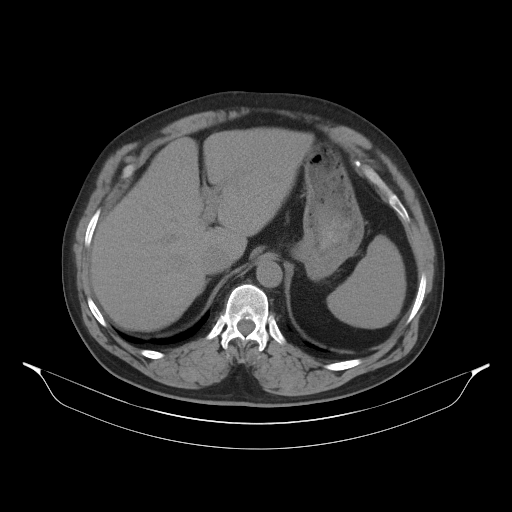
[im 85/108  lung]
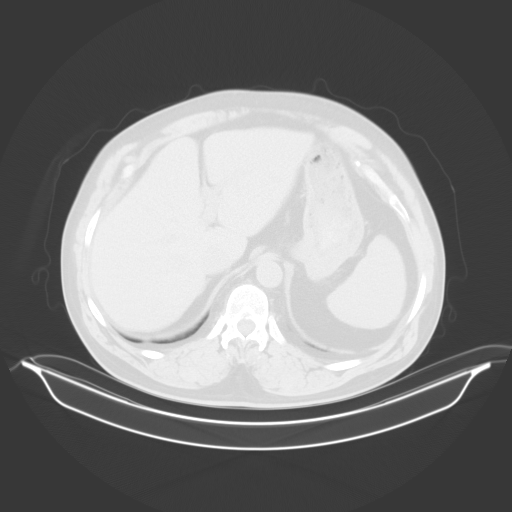
[im 91/108  soft-tissue]
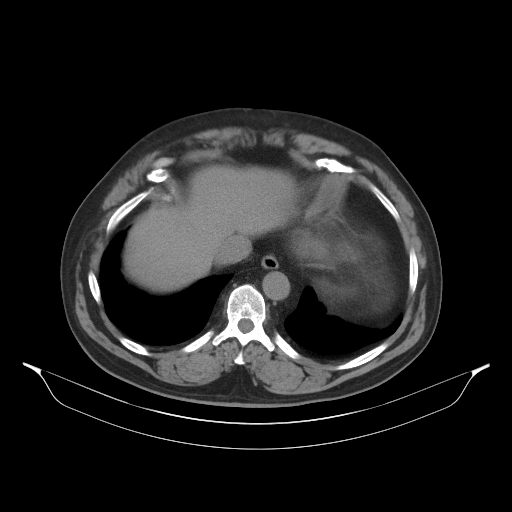
[im 91/108  lung]
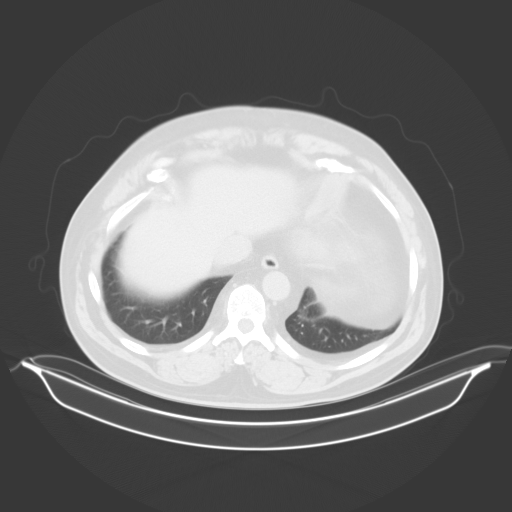
[im 96/108  lung]
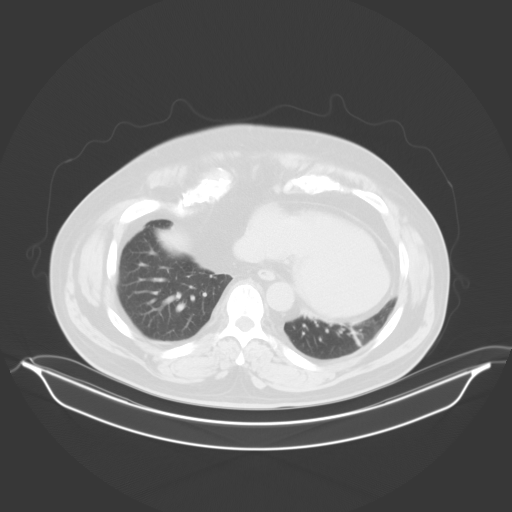
[im 102/108  soft-tissue]
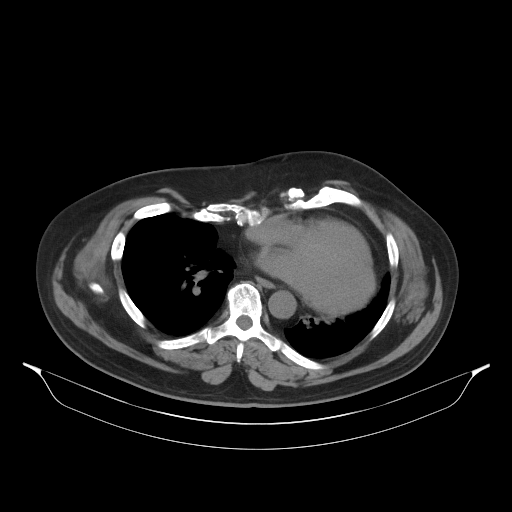
[im 102/108  lung]
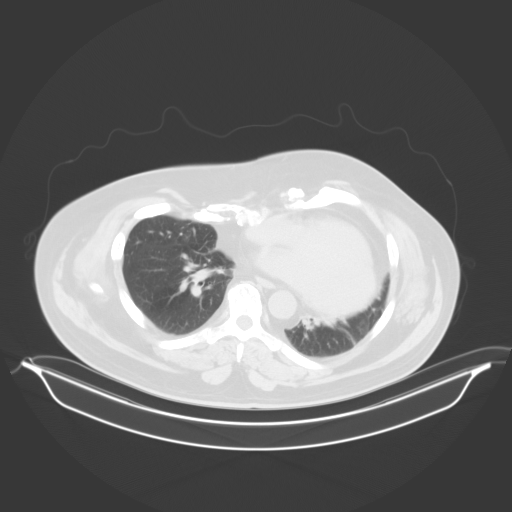

[13 of 32 positions shown; findings below may reference images not displayed]

FINDINGS: Lower chest: No acute abnormality. Mild chronic pectus deformity is
noted.

Hepatobiliary: No focal liver abnormality is seen. No gallstones,
gallbladder wall thickening, or biliary dilatation.

Pancreas: Unremarkable. No pancreatic ductal dilatation or
surrounding inflammatory changes.

Spleen: Normal in size without focal abnormality.

Adrenals/Urinary Tract: Adrenal glands are within normal limits
bilaterally. Kidneys are visualized without renal calculi or
obstructive changes. Mild scarring in the upper pole of the right
kidney is noted stable from the prior study. Ureters are within
normal limits. The bladder is well distended.

Stomach/Bowel: Minimal diverticular change of the colon is noted. No
evidence of diverticulitis is seen. No obstructive or inflammatory
changes are noted. The appendix is within normal limits. No
obstructive changes of the small bowel are noted. Stomach is within
normal limits.

Vascular/Lymphatic: Aortic atherosclerosis. No enlarged abdominal or
pelvic lymph nodes. Duplicated IVC is noted.

Reproductive: Prostate is well visualized. Changes of prior urolift
procedure are noted.

Other: No free fluid is noted. Small hernia is again identified with
a knuckle of small bowel within similar to that noted on prior exam.
Postsurgical changes are noted in the left anterior abdominal wall
which correspond to the palpable abnormality seen just below the
previously described hernia. These changes are new from the prior
exam. Right inguinal hernia is noted containing fat. No bowel is
seen within. This has increased in the interval from the prior exam.
Slightly more prominent fat plane is noted within the left inguinal
canal consistent with a small recurrent hernia as well.

Musculoskeletal: Postsurgical changes in the lower lumbar spine are
seen. No acute abnormality is noted.
IMPRESSION: Palpable abnormalities correspond to a stable anterior wall
abdominal hernia adjacent to the umbilicus stable from the prior
exam. The second left-sided palpable abnormality corresponds to
scarring from previous surgery new from the prior exam. The third
palpable abnormality in the right lower quadrant corresponds to a
fat containing right inguinal hernia. A small recurrent fat
containing left inguinal hernia is noted as well.

Diverticulosis without diverticulitis.

Chronic changes similar to that seen on the prior exam.

## 2023-12-17 ENCOUNTER — Ambulatory Visit (INDEPENDENT_AMBULATORY_CARE_PROVIDER_SITE_OTHER): Payer: Medicare HMO

## 2023-12-17 ENCOUNTER — Encounter: Payer: Self-pay | Admitting: Podiatry

## 2023-12-17 ENCOUNTER — Ambulatory Visit (INDEPENDENT_AMBULATORY_CARE_PROVIDER_SITE_OTHER): Payer: Medicare HMO | Admitting: Podiatry

## 2023-12-17 DIAGNOSIS — M205X2 Other deformities of toe(s) (acquired), left foot: Secondary | ICD-10-CM | POA: Diagnosis not present

## 2023-12-17 DIAGNOSIS — M7752 Other enthesopathy of left foot: Secondary | ICD-10-CM | POA: Diagnosis not present

## 2023-12-17 DIAGNOSIS — B351 Tinea unguium: Secondary | ICD-10-CM

## 2023-12-17 DIAGNOSIS — M79672 Pain in left foot: Secondary | ICD-10-CM

## 2023-12-17 DIAGNOSIS — G8929 Other chronic pain: Secondary | ICD-10-CM | POA: Diagnosis not present

## 2023-12-17 MED ORDER — TRIAMCINOLONE ACETONIDE 10 MG/ML IJ SUSP
10.0000 mg | Freq: Once | INTRAMUSCULAR | Status: AC
  Administered 2023-12-17: 10 mg via INTRA_ARTICULAR

## 2023-12-20 NOTE — Progress Notes (Signed)
Subjective:   Patient ID: [name], male   DOB: 63 y.o.   MRN: [id number]   HPI Patient presents stating he has had a lot of tone in his big toe joint left and it has been sore for period of time worse over the last few months.  Also has had some fungal yellowness to his nailbeds and is currently on Lamisil from family physician.  Patient does not smoke likes to be active   Review of Systems  All other systems reviewed and are negative.       Objective:  Physical Exam Vitals and nursing note reviewed.  Constitutional:      Appearance: He is well-developed.  Pulmonary:     Effort: Pulmonary effort is normal.  Musculoskeletal:        General: Normal range of motion.  Skin:    General: Skin is warm.  Neurological:     Mental Status: He is alert.     Neurovascular status intact muscle strength found to be adequate range of motion subtalar midtarsal joint adequate reduced range of motion first MPJ left with fluid buildup around the joint and pain to pressure around the joint surface.  Patient is found to have good digital perfusion well-oriented x 3 with thick yellow brittle nailbeds 1-5 both feet     Assessment:  Inflammatory capsulitis first MPJ left with probable structural damage along with nail disease 1-5 both feet e     Plan:  H&P and reviewed condition and x-rays.  I do think this will require eventual biplanar osteotomy left foot I want to try conservative first.  Patient has been utilizing shoe gear modifications we will continue this I did do sterile prep I injected around the first MPJ 3 mg dexamethasone Kenalog 5 mg Xylocaine periarticular and applied sterile dressing.  Continue his Lamisil will be seen back in 1 month we did discuss surgery at this time  X-rays indicate that there is arthritis with spur formation around the first MPJ left but the joint is rounded and hopefully would not require fusion or joint implant

## 2023-12-29 DIAGNOSIS — G5603 Carpal tunnel syndrome, bilateral upper limbs: Secondary | ICD-10-CM | POA: Diagnosis not present

## 2023-12-29 DIAGNOSIS — G5631 Lesion of radial nerve, right upper limb: Secondary | ICD-10-CM | POA: Diagnosis not present

## 2024-01-14 ENCOUNTER — Encounter: Payer: Self-pay | Admitting: Podiatry

## 2024-01-14 ENCOUNTER — Ambulatory Visit: Payer: Medicare HMO | Admitting: Podiatry

## 2024-01-14 DIAGNOSIS — M205X2 Other deformities of toe(s) (acquired), left foot: Secondary | ICD-10-CM

## 2024-01-14 DIAGNOSIS — B351 Tinea unguium: Secondary | ICD-10-CM

## 2024-01-15 NOTE — Progress Notes (Signed)
 Subjective:   Patient ID: [name], male   DOB: 63 y.o.   MRN: [id number]   HPI Patient presents stating he had minimal improvement after procedure done the end of January stating he got better for a few days but has reoccurred   ROS      Objective:  Physical Exam  Vascular status intact with exquisite discomfort in the left first MPJ fluid buildup around the joint painful when pressed     Assessment:  Significant hallux limitus deformity left no crepitus of the joint moderate reduction motion but a lot of pain     Plan:  H&P reviewed and I do think given failure to respond conservatively that correction would be best.  I did explain biplanar osteotomy with removal of dorsal and lateral bone spurs and hopeful prevention of fusion or joint implantation.  I allowed him to read consent form going over alternative treatments risk and he does understand the possibility for those procedures in future but wants to go this route.  I allowed him to sign consent form after reviewing it and I did dispense air fracture walker properly fitted to his lower leg.  I want him to wear it prior to surgery so he is used to it when we go into the postop period and find a shoe on the other foot that manages it well

## 2024-01-19 ENCOUNTER — Telehealth: Payer: Self-pay | Admitting: Podiatry

## 2024-01-19 DIAGNOSIS — B351 Tinea unguium: Secondary | ICD-10-CM | POA: Diagnosis not present

## 2024-01-19 DIAGNOSIS — G44219 Episodic tension-type headache, not intractable: Secondary | ICD-10-CM | POA: Diagnosis not present

## 2024-01-19 DIAGNOSIS — R413 Other amnesia: Secondary | ICD-10-CM | POA: Diagnosis not present

## 2024-01-19 DIAGNOSIS — G8929 Other chronic pain: Secondary | ICD-10-CM | POA: Diagnosis not present

## 2024-01-19 DIAGNOSIS — R739 Hyperglycemia, unspecified: Secondary | ICD-10-CM | POA: Diagnosis not present

## 2024-01-19 DIAGNOSIS — Z79899 Other long term (current) drug therapy: Secondary | ICD-10-CM | POA: Diagnosis not present

## 2024-01-19 DIAGNOSIS — E785 Hyperlipidemia, unspecified: Secondary | ICD-10-CM | POA: Diagnosis not present

## 2024-01-19 DIAGNOSIS — E538 Deficiency of other specified B group vitamins: Secondary | ICD-10-CM | POA: Diagnosis not present

## 2024-01-19 DIAGNOSIS — E559 Vitamin D deficiency, unspecified: Secondary | ICD-10-CM | POA: Diagnosis not present

## 2024-01-19 DIAGNOSIS — H9313 Tinnitus, bilateral: Secondary | ICD-10-CM | POA: Diagnosis not present

## 2024-01-19 DIAGNOSIS — J302 Other seasonal allergic rhinitis: Secondary | ICD-10-CM | POA: Diagnosis not present

## 2024-01-19 DIAGNOSIS — K219 Gastro-esophageal reflux disease without esophagitis: Secondary | ICD-10-CM | POA: Diagnosis not present

## 2024-01-19 NOTE — Telephone Encounter (Signed)
 DOS-02/01/24  [name] [id number]  [employer] EFFECTIVE DATE- 06/24/23  PER THE [employer] AUTOMATED SYSTEM, PRIOR AUTH IS NOT REQUIRED FOR CPT CODE 45409   CALL REF #: [id number]

## 2024-01-21 DIAGNOSIS — G44219 Episodic tension-type headache, not intractable: Secondary | ICD-10-CM | POA: Diagnosis not present

## 2024-01-21 DIAGNOSIS — E538 Deficiency of other specified B group vitamins: Secondary | ICD-10-CM | POA: Diagnosis not present

## 2024-01-21 DIAGNOSIS — Z125 Encounter for screening for malignant neoplasm of prostate: Secondary | ICD-10-CM | POA: Diagnosis not present

## 2024-01-21 DIAGNOSIS — E559 Vitamin D deficiency, unspecified: Secondary | ICD-10-CM | POA: Diagnosis not present

## 2024-01-21 DIAGNOSIS — K219 Gastro-esophageal reflux disease without esophagitis: Secondary | ICD-10-CM | POA: Diagnosis not present

## 2024-01-21 DIAGNOSIS — J302 Other seasonal allergic rhinitis: Secondary | ICD-10-CM | POA: Diagnosis not present

## 2024-01-21 DIAGNOSIS — E785 Hyperlipidemia, unspecified: Secondary | ICD-10-CM | POA: Diagnosis not present

## 2024-01-21 DIAGNOSIS — H9313 Tinnitus, bilateral: Secondary | ICD-10-CM | POA: Diagnosis not present

## 2024-01-21 DIAGNOSIS — R739 Hyperglycemia, unspecified: Secondary | ICD-10-CM | POA: Diagnosis not present

## 2024-01-21 DIAGNOSIS — G8929 Other chronic pain: Secondary | ICD-10-CM | POA: Diagnosis not present

## 2024-01-21 DIAGNOSIS — B351 Tinea unguium: Secondary | ICD-10-CM | POA: Diagnosis not present

## 2024-01-21 DIAGNOSIS — Z79899 Other long term (current) drug therapy: Secondary | ICD-10-CM | POA: Diagnosis not present

## 2024-01-21 DIAGNOSIS — R413 Other amnesia: Secondary | ICD-10-CM | POA: Diagnosis not present

## 2024-02-01 ENCOUNTER — Other Ambulatory Visit: Payer: Self-pay | Admitting: Podiatry

## 2024-02-01 DIAGNOSIS — M2012 Hallux valgus (acquired), left foot: Secondary | ICD-10-CM | POA: Diagnosis not present

## 2024-02-01 DIAGNOSIS — M205X2 Other deformities of toe(s) (acquired), left foot: Secondary | ICD-10-CM | POA: Diagnosis not present

## 2024-02-01 MED ORDER — ONDANSETRON HCL 4 MG PO TABS: 4.0000 mg | ORAL_TABLET | Freq: Three times a day (TID) | ORAL | 0 refills | Status: DC | PRN

## 2024-02-01 MED ORDER — OXYCODONE-ACETAMINOPHEN 10-325 MG PO TABS: 1.0000 | ORAL_TABLET | ORAL | 0 refills | Status: DC | PRN

## 2024-02-07 ENCOUNTER — Ambulatory Visit

## 2024-02-07 ENCOUNTER — Ambulatory Visit (INDEPENDENT_AMBULATORY_CARE_PROVIDER_SITE_OTHER)

## 2024-02-07 ENCOUNTER — Ambulatory Visit (INDEPENDENT_AMBULATORY_CARE_PROVIDER_SITE_OTHER): Payer: Medicare HMO | Admitting: Podiatry

## 2024-02-07 DIAGNOSIS — L600 Ingrowing nail: Secondary | ICD-10-CM | POA: Diagnosis not present

## 2024-02-07 DIAGNOSIS — G8929 Other chronic pain: Secondary | ICD-10-CM

## 2024-02-07 DIAGNOSIS — M79672 Pain in left foot: Secondary | ICD-10-CM

## 2024-02-07 NOTE — Patient Instructions (Signed)

## 2024-02-07 NOTE — Progress Notes (Signed)
 Patient POV #1 DOS 3/11325 Biplanar Osteotomy with Fixation. Pt denies fever or chills. No signs of infection at incision site. Pt given aftercare instructions to increase mobility using surgical shoe, exercising left great toe using up and down motion five times a day and walking more using the heel of foot. Pt understands that he is able to shower, but not bear weight and go between boot and shoe. Follow up visit in 3 weeks.

## 2024-02-08 DIAGNOSIS — H5203 Hypermetropia, bilateral: Secondary | ICD-10-CM | POA: Diagnosis not present

## 2024-02-08 NOTE — Progress Notes (Signed)
 Subjective:   Patient ID: [name], male   DOB: 64 y.o.   MRN: [id number]   HPI Patient presents with pain in the right fourth nail with chronic disease and states doing very well with surgery on his left very pleased so far   ROS      Objective:  Physical Exam  Vascular status intact negative Denna Haggard' sign left noted wound edges well coapted range of motion good no crepitus of the joint with a damaged incurvated fourth nail right foot     Assessment:  Doing well post surgical correction left first metatarsal with damaged painful chronic fourth nail right foot     Plan:  H&P x-rays left reviewed and recommended correction of the right fourth toe.  Patient wants this done read signed consent form and today I infiltrated the right fourth toe 60 mg Xylocaine Marcaine mixture sterile prep done and using sterile instrumentation remove the nail exposed matrix applied phenol for applications 30 seconds followed by alcohol lavage sterile dressing.  Gave instructions for the left 1 to continue immobilization elevation compression sterile dressing reapplied  X-rays indicate osteotomies healing well fixation in place joint congruence

## 2024-02-09 ENCOUNTER — Encounter: Payer: Self-pay | Admitting: Podiatry

## 2024-02-21 ENCOUNTER — Encounter: Payer: Medicare HMO | Admitting: Podiatry

## 2024-03-01 ENCOUNTER — Ambulatory Visit (INDEPENDENT_AMBULATORY_CARE_PROVIDER_SITE_OTHER): Admitting: Podiatry

## 2024-03-01 DIAGNOSIS — Z9889 Other specified postprocedural states: Secondary | ICD-10-CM

## 2024-03-01 DIAGNOSIS — M205X2 Other deformities of toe(s) (acquired), left foot: Secondary | ICD-10-CM

## 2024-03-01 NOTE — Progress Notes (Signed)
 Subjective:  Patient ID: [name], male    DOB: 07/02/1961,  MRN: [id number]  Chief Complaint  Patient presents with   Routine Post Op    DOS 02/01/24 --- BIPLANTAR OSTEOTOMY WITH FIXATION LEFT    DOS: 02/01/2024 Procedure: Left bunionectomy biplanar  63 y.o. male returns for post-op check.  Patient states that he is doing well.  Denies any other complaints improved range of motion exercises.  Review of Systems: Negative except as noted in the HPI. Denies N/V/F/Ch.  Past Medical History:  Diagnosis Date   Allergy    Back pain    Chewing difficulty    Difficult intubation    GERD (gastroesophageal reflux disease)    Hearing loss    Lordosis of cervicothoracic region    Muscle weakness (generalized)    Other fatigue    Pectus excavatum    Poor short term memory    Shortness of breath on exertion    Swallowing difficulty    Tinnitus     Current Outpatient Medications:    acetaminophen (TYLENOL) 500 MG tablet, Take 1,000 mg by mouth every 6 (six) hours as needed for moderate pain or headache., Disp: , Rfl:    Ascorbic Acid (VITAMIN C) 1000 MG tablet, Take 1,000 mg by mouth daily., Disp: , Rfl:    B Complex-C (SUPER B COMPLEX PO), Take 1 tablet by mouth daily., Disp: , Rfl:    cetirizine (ZYRTEC) 10 MG tablet, Take 10 mg by mouth daily., Disp: , Rfl:    ezetimibe (ZETIA) 10 MG tablet, Take 1 tablet (10 mg total) by mouth daily., Disp: 90 tablet, Rfl: 3   meclizine (ANTIVERT) 25 MG tablet, Take 25 mg by mouth 4 (four) times daily., Disp: , Rfl:    omeprazole (PRILOSEC) 40 MG capsule, Take 40 mg by mouth every evening., Disp: , Rfl:    ondansetron (ZOFRAN) 4 MG tablet, Take 1 tablet (4 mg total) by mouth every 8 (eight) hours as needed for nausea or vomiting., Disp: 20 tablet, Rfl: 0   oxyCODONE (OXY IR/ROXICODONE) 5 MG immediate release tablet, Take 10 mg by mouth 2 (two) times daily as needed (pain)., Disp: , Rfl:    oxyCODONE-acetaminophen (PERCOCET) 10-325 MG tablet, Take 1  tablet by mouth every 4 (four) hours as needed for pain., Disp: 20 tablet, Rfl: 0   rosuvastatin (CRESTOR) 20 MG tablet, Take 1 tablet (20 mg total) by mouth at bedtime., Disp: 90 tablet, Rfl: 3   tiZANidine (ZANAFLEX) 4 MG tablet, Take 4 mg by mouth 4 (four) times daily as needed for muscle spasms., Disp: , Rfl:   Social History   Tobacco Use  Smoking Status Never  Smokeless Tobacco Never    Allergies  Allergen Reactions   Morphine Itching   Objective:  There were no vitals filed for this visit. There is no height or weight on file to calculate BMI. Constitutional Well developed. Well nourished.  Vascular Foot warm and well perfused. Capillary refill normal to all digits.   Neurologic Normal speech. Oriented to person, place, and time. Epicritic sensation to light touch grossly present bilaterally.  Dermatologic Skin reepithelialized.  No signs of Deis is noted no complication noted.  Good range of motion noted of first metatarsophalangeal joint  Orthopedic: Tenderness to palpation noted about the surgical site.   Radiographs: None Assessment:   1. Hallux limitus, left   2. S/P foot surgery    Plan:  Patient was evaluated and treated and all questions answered.  S/p foot surgery left -Progressing as expected post-operatively. -XR: See above -WB Status: Weightbearing as tolerated in regular shoes -Sutures: Removed no clinical signs of Deis is noted no complication noted. -Medications: None -Foot redressed.  No follow-ups on file.

## 2024-03-02 DIAGNOSIS — H5203 Hypermetropia, bilateral: Secondary | ICD-10-CM | POA: Diagnosis not present

## 2024-03-18 DIAGNOSIS — M79675 Pain in left toe(s): Secondary | ICD-10-CM | POA: Diagnosis not present

## 2024-04-10 ENCOUNTER — Encounter: Admitting: Podiatry

## 2024-04-10 ENCOUNTER — Ambulatory Visit (INDEPENDENT_AMBULATORY_CARE_PROVIDER_SITE_OTHER)

## 2024-04-10 ENCOUNTER — Ambulatory Visit (INDEPENDENT_AMBULATORY_CARE_PROVIDER_SITE_OTHER): Admitting: Podiatrist

## 2024-04-10 DIAGNOSIS — M205X2 Other deformities of toe(s) (acquired), left foot: Secondary | ICD-10-CM

## 2024-04-10 DIAGNOSIS — Z9889 Other specified postprocedural states: Secondary | ICD-10-CM

## 2024-04-10 NOTE — Progress Notes (Signed)
 Chief Complaint  Patient presents with   Post-op Follow-up    Doing well since surgery. Some lingering tenderness but has good ROM. Some swelling from time to time.      HPI: Patient is 63 y.o. male who presents today for post op follow s/p left shortening osteotomy of the first with pin fixation by Dr. Celia Coles. He relates overall he is doing well. No complaints   Allergies  Allergen Reactions   Morphine Itching    Review of systems is negative except as noted in the HPI.  Denies nausea/ vomiting/ fevers/ chills or night sweats.   Denies difficulty breathing, denies calf pain or tenderness  Physical Exam  Patient is awake, alert, and oriented x 3.  In no acute distress.    Vascular status is intact with palpable pedal pulses DP and PT bilateral and capillary refill time less than 3 seconds bilateral.  No edema or erythema noted.   Neurological exam reveals epicritic and protective sensation grossly intact bilateral.   Dermatological exam reveals skin is supple and dry to bilateral feet.  No open lesions present.    Musculoskeletal exam: Musculature intact with dorsiflexion, plantarflexion, inversion, eversion. Ankle and First MPJ joint range of motion normal.   Xrays- 3 views left foot -- excellent post op progress noted. Pin fixation in excellent alignment and good bone healing noted.  Some swelling still present and normal at this post operative period in healing.    Assessment:   ICD-10-CM   1. Hallux limitus, left  M20.5X2 DG Foot Complete Left    2. Status post left foot surgery  Z98.890        Plan: Discussed he should be working on the range of motion of the first MPJ and I showed him specific stretches to help improve the mobility of the joint.  He may continue wearing his sneakers and resume his normal activity.  Recommended he return in 1 month for a final post op visit with Dr. Celia Coles.  If any concern arise he will call.

## 2024-05-10 ENCOUNTER — Ambulatory Visit: Admitting: Podiatry

## 2024-05-10 ENCOUNTER — Ambulatory Visit (INDEPENDENT_AMBULATORY_CARE_PROVIDER_SITE_OTHER)

## 2024-05-10 DIAGNOSIS — M205X2 Other deformities of toe(s) (acquired), left foot: Secondary | ICD-10-CM

## 2024-05-11 ENCOUNTER — Ambulatory Visit: Admitting: Podiatry

## 2024-05-11 NOTE — Progress Notes (Signed)
 Subjective:   Patient ID: [name], male   DOB: 63 y.o.   MRN: [id number]   HPI Patient presents stating he wants his left foot looked at that he gets mild discomfort but overall is done much better since the surgery   ROS      Objective:  Physical Exam  Neurovascular status intact negative Celine Collard' sign noted left foot is healing well with good range of motion no crepitus of the joint and patient is walking with a good heel toe gait     Assessment:  Recovering well from having had foot surgery left first MPJ     Plan:  H&P reviewed final x-rays taken advised on return to normal activity and that if any issues were to occur to let us  know but sees on a very good trend right now  X-rays indicate the osteotomy is healing well fixation in place joint congruence

## 2024-06-30 DIAGNOSIS — E559 Vitamin D deficiency, unspecified: Secondary | ICD-10-CM | POA: Diagnosis not present

## 2024-06-30 DIAGNOSIS — Z1159 Encounter for screening for other viral diseases: Secondary | ICD-10-CM | POA: Diagnosis not present

## 2024-06-30 DIAGNOSIS — R413 Other amnesia: Secondary | ICD-10-CM | POA: Diagnosis not present

## 2024-06-30 DIAGNOSIS — Z0000 Encounter for general adult medical examination without abnormal findings: Secondary | ICD-10-CM | POA: Diagnosis not present

## 2024-06-30 DIAGNOSIS — R739 Hyperglycemia, unspecified: Secondary | ICD-10-CM | POA: Diagnosis not present

## 2024-06-30 DIAGNOSIS — K219 Gastro-esophageal reflux disease without esophagitis: Secondary | ICD-10-CM | POA: Diagnosis not present

## 2024-06-30 DIAGNOSIS — G8929 Other chronic pain: Secondary | ICD-10-CM | POA: Diagnosis not present

## 2024-06-30 DIAGNOSIS — E785 Hyperlipidemia, unspecified: Secondary | ICD-10-CM | POA: Diagnosis not present

## 2024-06-30 DIAGNOSIS — H9193 Unspecified hearing loss, bilateral: Secondary | ICD-10-CM | POA: Diagnosis not present

## 2024-07-06 DIAGNOSIS — L821 Other seborrheic keratosis: Secondary | ICD-10-CM | POA: Diagnosis not present

## 2024-07-06 DIAGNOSIS — X32XXXA Exposure to sunlight, initial encounter: Secondary | ICD-10-CM | POA: Diagnosis not present

## 2024-07-06 DIAGNOSIS — D225 Melanocytic nevi of trunk: Secondary | ICD-10-CM | POA: Diagnosis not present

## 2024-07-06 DIAGNOSIS — L570 Actinic keratosis: Secondary | ICD-10-CM | POA: Diagnosis not present

## 2024-10-31 DIAGNOSIS — Z6833 Body mass index (BMI) 33.0-33.9, adult: Secondary | ICD-10-CM | POA: Diagnosis not present

## 2024-10-31 DIAGNOSIS — G8929 Other chronic pain: Secondary | ICD-10-CM | POA: Diagnosis not present

## 2024-10-31 DIAGNOSIS — E785 Hyperlipidemia, unspecified: Secondary | ICD-10-CM | POA: Diagnosis not present

## 2024-10-31 DIAGNOSIS — E6609 Other obesity due to excess calories: Secondary | ICD-10-CM | POA: Diagnosis not present

## 2024-10-31 DIAGNOSIS — Z1211 Encounter for screening for malignant neoplasm of colon: Secondary | ICD-10-CM | POA: Diagnosis not present

## 2024-10-31 DIAGNOSIS — R413 Other amnesia: Secondary | ICD-10-CM | POA: Diagnosis not present

## 2024-10-31 DIAGNOSIS — E66811 Obesity, class 1: Secondary | ICD-10-CM | POA: Diagnosis not present

## 2024-10-31 DIAGNOSIS — K219 Gastro-esophageal reflux disease without esophagitis: Secondary | ICD-10-CM | POA: Diagnosis not present

## 2024-12-20 ENCOUNTER — Ambulatory Visit: Admitting: Podiatry

## 2024-12-20 ENCOUNTER — Encounter: Payer: Self-pay | Admitting: Podiatry

## 2024-12-20 ENCOUNTER — Ambulatory Visit (INDEPENDENT_AMBULATORY_CARE_PROVIDER_SITE_OTHER)

## 2024-12-20 DIAGNOSIS — M722 Plantar fascial fibromatosis: Secondary | ICD-10-CM

## 2024-12-20 MED ORDER — DICLOFENAC SODIUM 75 MG PO TBEC: 75.0000 mg | DELAYED_RELEASE_TABLET | Freq: Two times a day (BID) | ORAL | 2 refills | Status: AC

## 2024-12-20 MED ORDER — TRIAMCINOLONE ACETONIDE 10 MG/ML IJ SUSP
10.0000 mg | Freq: Once | INTRAMUSCULAR | Status: AC
  Administered 2024-12-20: 10 mg via INTRA_ARTICULAR

## 2024-12-20 NOTE — Patient Instructions (Signed)

## 2024-12-20 NOTE — Progress Notes (Signed)
 Subjective:   Patient ID: [name], male   DOB: 64 y.o.   MRN: [id number]   HPI Patient states that he developed a lot of pain in the bottom of his right heel over the last month he does not remember specific injury.  He did have surgery on his left which has done very well but this right is a problem for him and hard to walk on it   ROS      Objective:  Physical Exam  Neurovascular status intact acute discomfort plantar aspect right heel at the insertional point tendon calcaneus     Assessment:  Acute plantar fasciitis right inflammation fluid around the medial band     Plan:  H&P reviewed today I did sterile prep I injected the fascia at insertion 3 mg Kenalog  5 mg Xylocaine  I applied fascial brace properly fitted into the arch to lift the arch I instructed on support shoe gear and patient will be seen back to recheck  X-rays were negative for spur did indicate moderate structural hallux limitus deformity right

## 2025-01-03 ENCOUNTER — Ambulatory Visit: Admitting: Podiatry
